# Patient Record
Sex: Male | Born: 1935 | Race: Black or African American | Hispanic: No | Smoking: Former smoker
Health system: Southern US, Community
[De-identification: ages and names within clinical notes are randomized; demographics above are authoritative.]

## PROBLEM LIST (undated history)

## (undated) DIAGNOSIS — C61 Malignant neoplasm of prostate: Secondary | ICD-10-CM

## (undated) DIAGNOSIS — G9341 Metabolic encephalopathy: Secondary | ICD-10-CM

## (undated) DIAGNOSIS — I219 Acute myocardial infarction, unspecified: Secondary | ICD-10-CM

## (undated) DIAGNOSIS — E1121 Type 2 diabetes mellitus with diabetic nephropathy: Secondary | ICD-10-CM

## (undated) DIAGNOSIS — I10 Essential (primary) hypertension: Secondary | ICD-10-CM

## (undated) DIAGNOSIS — G40209 Localization-related (focal) (partial) symptomatic epilepsy and epileptic syndromes with complex partial seizures, not intractable, without status epilepticus: Secondary | ICD-10-CM

## (undated) DIAGNOSIS — D509 Iron deficiency anemia, unspecified: Secondary | ICD-10-CM

## (undated) DIAGNOSIS — N183 Chronic kidney disease, stage 3 (moderate): Secondary | ICD-10-CM

## (undated) DIAGNOSIS — F0390 Unspecified dementia without behavioral disturbance: Secondary | ICD-10-CM

## (undated) DIAGNOSIS — I639 Cerebral infarction, unspecified: Secondary | ICD-10-CM

## (undated) DIAGNOSIS — Z8673 Personal history of transient ischemic attack (TIA), and cerebral infarction without residual deficits: Secondary | ICD-10-CM

## (undated) DIAGNOSIS — E785 Hyperlipidemia, unspecified: Secondary | ICD-10-CM

## (undated) DIAGNOSIS — E1129 Type 2 diabetes mellitus with other diabetic kidney complication: Secondary | ICD-10-CM

## (undated) DIAGNOSIS — C189 Malignant neoplasm of colon, unspecified: Secondary | ICD-10-CM

## (undated) DIAGNOSIS — C180 Malignant neoplasm of cecum: Secondary | ICD-10-CM

## (undated) HISTORY — DX: Unspecified dementia without behavioral disturbance: F03.90

## (undated) HISTORY — DX: Cerebral infarction, unspecified: I63.9

## (undated) HISTORY — DX: Metabolic encephalopathy: G93.41

## (undated) HISTORY — DX: Personal history of transient ischemic attack (TIA), and cerebral infarction without residual deficits: Z86.73

## (undated) HISTORY — DX: Malignant neoplasm of cecum: C18.0

## (undated) HISTORY — DX: Localization-related (focal) (partial) symptomatic epilepsy and epileptic syndromes with complex partial seizures, not intractable, without status epilepticus: G40.209

## (undated) HISTORY — PX: PROSTATE SURGERY: SHX751

## (undated) HISTORY — DX: Type 2 diabetes mellitus with other diabetic kidney complication: E11.29

## (undated) HISTORY — DX: Chronic kidney disease, stage 3 (moderate): N18.3

## (undated) HISTORY — DX: Essential (primary) hypertension: I10

## (undated) HISTORY — DX: Malignant neoplasm of prostate: C61

## (undated) HISTORY — DX: Iron deficiency anemia, unspecified: D50.9

---

## 1998-06-15 ENCOUNTER — Ambulatory Visit (HOSPITAL_COMMUNITY): Admission: RE | Admit: 1998-06-15 | Discharge: 1998-06-15 | Payer: Self-pay | Admitting: Cardiology

## 1998-09-06 ENCOUNTER — Encounter: Payer: Self-pay | Admitting: Cardiology

## 1998-09-06 ENCOUNTER — Ambulatory Visit (HOSPITAL_COMMUNITY): Admission: RE | Admit: 1998-09-06 | Discharge: 1998-09-06 | Payer: Self-pay | Admitting: Cardiology

## 1999-01-07 ENCOUNTER — Emergency Department (HOSPITAL_COMMUNITY): Admission: EM | Admit: 1999-01-07 | Discharge: 1999-01-07 | Payer: Self-pay | Admitting: Emergency Medicine

## 2000-01-01 ENCOUNTER — Emergency Department (HOSPITAL_COMMUNITY): Admission: EM | Admit: 2000-01-01 | Discharge: 2000-01-01 | Payer: Self-pay | Admitting: Emergency Medicine

## 2001-05-23 ENCOUNTER — Emergency Department (HOSPITAL_COMMUNITY): Admission: EM | Admit: 2001-05-23 | Discharge: 2001-05-23 | Payer: Self-pay | Admitting: Emergency Medicine

## 2001-09-15 ENCOUNTER — Encounter: Payer: Self-pay | Admitting: Emergency Medicine

## 2001-09-15 ENCOUNTER — Emergency Department (HOSPITAL_COMMUNITY): Admission: EM | Admit: 2001-09-15 | Discharge: 2001-09-15 | Payer: Self-pay | Admitting: Emergency Medicine

## 2001-12-06 ENCOUNTER — Emergency Department (HOSPITAL_COMMUNITY): Admission: EM | Admit: 2001-12-06 | Discharge: 2001-12-06 | Payer: Self-pay | Admitting: Emergency Medicine

## 2002-05-08 ENCOUNTER — Emergency Department (HOSPITAL_COMMUNITY): Admission: EM | Admit: 2002-05-08 | Discharge: 2002-05-08 | Payer: Self-pay | Admitting: Emergency Medicine

## 2002-09-24 ENCOUNTER — Encounter: Payer: Self-pay | Admitting: Cardiology

## 2002-09-24 ENCOUNTER — Ambulatory Visit (HOSPITAL_COMMUNITY): Admission: RE | Admit: 2002-09-24 | Discharge: 2002-09-24 | Payer: Self-pay | Admitting: Cardiology

## 2004-12-19 ENCOUNTER — Encounter: Admission: RE | Admit: 2004-12-19 | Discharge: 2004-12-19 | Payer: Self-pay | Admitting: Cardiology

## 2005-01-15 ENCOUNTER — Emergency Department (HOSPITAL_COMMUNITY): Admission: EM | Admit: 2005-01-15 | Discharge: 2005-01-15 | Payer: Self-pay | Admitting: Emergency Medicine

## 2006-03-11 ENCOUNTER — Emergency Department (HOSPITAL_COMMUNITY): Admission: EM | Admit: 2006-03-11 | Discharge: 2006-03-11 | Payer: Self-pay | Admitting: Emergency Medicine

## 2006-07-19 ENCOUNTER — Encounter: Admission: RE | Admit: 2006-07-19 | Discharge: 2006-07-19 | Payer: Self-pay | Admitting: Cardiology

## 2007-11-01 ENCOUNTER — Emergency Department (HOSPITAL_COMMUNITY): Admission: EM | Admit: 2007-11-01 | Discharge: 2007-11-01 | Payer: Self-pay | Admitting: Emergency Medicine

## 2007-11-04 ENCOUNTER — Emergency Department (HOSPITAL_COMMUNITY): Admission: EM | Admit: 2007-11-04 | Discharge: 2007-11-04 | Payer: Self-pay | Admitting: Emergency Medicine

## 2007-12-29 ENCOUNTER — Inpatient Hospital Stay (HOSPITAL_COMMUNITY): Admission: EM | Admit: 2007-12-29 | Discharge: 2008-01-02 | Payer: Self-pay | Admitting: Emergency Medicine

## 2008-04-29 ENCOUNTER — Inpatient Hospital Stay (HOSPITAL_COMMUNITY): Admission: EM | Admit: 2008-04-29 | Discharge: 2008-05-02 | Payer: Self-pay | Admitting: Emergency Medicine

## 2008-05-01 ENCOUNTER — Encounter (INDEPENDENT_AMBULATORY_CARE_PROVIDER_SITE_OTHER): Payer: Self-pay | Admitting: Cardiology

## 2008-05-01 ENCOUNTER — Encounter (INDEPENDENT_AMBULATORY_CARE_PROVIDER_SITE_OTHER): Payer: Self-pay | Admitting: Neurology

## 2008-05-01 ENCOUNTER — Ambulatory Visit: Payer: Self-pay | Admitting: Surgery

## 2008-05-11 ENCOUNTER — Encounter (INDEPENDENT_AMBULATORY_CARE_PROVIDER_SITE_OTHER): Payer: Self-pay | Admitting: Cardiovascular Disease

## 2008-05-11 ENCOUNTER — Ambulatory Visit (HOSPITAL_COMMUNITY): Admission: RE | Admit: 2008-05-11 | Discharge: 2008-05-11 | Payer: Self-pay | Admitting: Cardiovascular Disease

## 2009-01-27 ENCOUNTER — Ambulatory Visit (HOSPITAL_COMMUNITY): Admission: RE | Admit: 2009-01-27 | Discharge: 2009-01-27 | Payer: Self-pay | Admitting: Urology

## 2009-03-25 ENCOUNTER — Ambulatory Visit: Admission: RE | Admit: 2009-03-25 | Discharge: 2009-06-10 | Payer: Self-pay | Admitting: Radiation Oncology

## 2009-06-04 ENCOUNTER — Emergency Department (HOSPITAL_COMMUNITY): Admission: EM | Admit: 2009-06-04 | Discharge: 2009-06-04 | Payer: Self-pay | Admitting: Emergency Medicine

## 2009-06-10 ENCOUNTER — Ambulatory Visit: Admission: RE | Admit: 2009-06-10 | Discharge: 2009-07-30 | Payer: Self-pay | Admitting: Radiation Oncology

## 2010-05-19 ENCOUNTER — Encounter: Admission: RE | Admit: 2010-05-19 | Discharge: 2010-05-19 | Payer: Self-pay | Admitting: Gastroenterology

## 2010-06-23 HISTORY — PX: COLON SURGERY: SHX602

## 2010-07-04 ENCOUNTER — Encounter (INDEPENDENT_AMBULATORY_CARE_PROVIDER_SITE_OTHER): Payer: Self-pay | Admitting: General Surgery

## 2010-07-04 ENCOUNTER — Inpatient Hospital Stay (HOSPITAL_COMMUNITY): Admission: RE | Admit: 2010-07-04 | Discharge: 2010-07-10 | Payer: Self-pay | Admitting: General Surgery

## 2010-07-14 ENCOUNTER — Ambulatory Visit: Payer: Self-pay | Admitting: Hematology and Oncology

## 2010-07-27 LAB — CBC WITH DIFFERENTIAL/PLATELET
BASO%: 0.6 % (ref 0.0–2.0)
Basophils Absolute: 0 10*3/uL (ref 0.0–0.1)
EOS%: 3.7 % (ref 0.0–7.0)
Eosinophils Absolute: 0.1 10*3/uL (ref 0.0–0.5)
HCT: 33.2 % — ABNORMAL LOW (ref 38.4–49.9)
HGB: 11.1 g/dL — ABNORMAL LOW (ref 13.0–17.1)
LYMPH%: 23.1 % (ref 14.0–49.0)
MCH: 29.4 pg (ref 27.2–33.4)
MCHC: 33.6 g/dL (ref 32.0–36.0)
MCV: 87.4 fL (ref 79.3–98.0)
MONO#: 0.4 10*3/uL (ref 0.1–0.9)
MONO%: 10.9 % (ref 0.0–14.0)
NEUT#: 2.4 10*3/uL (ref 1.5–6.5)
NEUT%: 61.7 % (ref 39.0–75.0)
Platelets: 366 10*3/uL (ref 140–400)
RBC: 3.8 10*6/uL — ABNORMAL LOW (ref 4.20–5.82)
RDW: 17.1 % — ABNORMAL HIGH (ref 11.0–14.6)
WBC: 3.9 10*3/uL — ABNORMAL LOW (ref 4.0–10.3)
lymph#: 0.9 10*3/uL (ref 0.9–3.3)

## 2010-07-27 LAB — COMPREHENSIVE METABOLIC PANEL
ALT: 17 U/L (ref 0–53)
AST: 14 U/L (ref 0–37)
Albumin: 4.2 g/dL (ref 3.5–5.2)
Alkaline Phosphatase: 97 U/L (ref 39–117)
BUN: 13 mg/dL (ref 6–23)
CO2: 22 mEq/L (ref 19–32)
Calcium: 9.3 mg/dL (ref 8.4–10.5)
Chloride: 97 mEq/L (ref 96–112)
Creatinine, Ser: 1 mg/dL (ref 0.40–1.50)
Glucose, Bld: 298 mg/dL — ABNORMAL HIGH (ref 70–99)
Potassium: 4.2 mEq/L (ref 3.5–5.3)
Sodium: 134 mEq/L — ABNORMAL LOW (ref 135–145)
Total Bilirubin: 0.3 mg/dL (ref 0.3–1.2)
Total Protein: 6.5 g/dL (ref 6.0–8.3)

## 2010-07-27 LAB — CEA: CEA: 1 ng/mL (ref 0.0–5.0)

## 2010-08-16 ENCOUNTER — Ambulatory Visit (HOSPITAL_COMMUNITY): Admission: RE | Admit: 2010-08-16 | Discharge: 2010-08-16 | Payer: Self-pay | Admitting: General Surgery

## 2010-08-19 ENCOUNTER — Ambulatory Visit: Payer: Self-pay | Admitting: Hematology and Oncology

## 2010-08-24 LAB — COMPREHENSIVE METABOLIC PANEL
ALT: 13 U/L (ref 0–53)
AST: 16 U/L (ref 0–37)
Albumin: 4.2 g/dL (ref 3.5–5.2)
Alkaline Phosphatase: 99 U/L (ref 39–117)
BUN: 14 mg/dL (ref 6–23)
CO2: 25 mEq/L (ref 19–32)
Calcium: 9 mg/dL (ref 8.4–10.5)
Chloride: 102 mEq/L (ref 96–112)
Creatinine, Ser: 1.03 mg/dL (ref 0.40–1.50)
Glucose, Bld: 272 mg/dL — ABNORMAL HIGH (ref 70–99)
Potassium: 4.8 mEq/L (ref 3.5–5.3)
Sodium: 137 mEq/L (ref 135–145)
Total Bilirubin: 0.5 mg/dL (ref 0.3–1.2)
Total Protein: 6.6 g/dL (ref 6.0–8.3)

## 2010-08-24 LAB — CBC WITH DIFFERENTIAL/PLATELET
BASO%: 0.5 % (ref 0.0–2.0)
Basophils Absolute: 0 10*3/uL (ref 0.0–0.1)
EOS%: 2.8 % (ref 0.0–7.0)
Eosinophils Absolute: 0.1 10*3/uL (ref 0.0–0.5)
HCT: 33.1 % — ABNORMAL LOW (ref 38.4–49.9)
HGB: 10.4 g/dL — ABNORMAL LOW (ref 13.0–17.1)
LYMPH%: 23.1 % (ref 14.0–49.0)
MCH: 26.8 pg — ABNORMAL LOW (ref 27.2–33.4)
MCHC: 31.4 g/dL — ABNORMAL LOW (ref 32.0–36.0)
MCV: 85.3 fL (ref 79.3–98.0)
MONO#: 0.3 10*3/uL (ref 0.1–0.9)
MONO%: 8.1 % (ref 0.0–14.0)
NEUT#: 2.6 10*3/uL (ref 1.5–6.5)
NEUT%: 65.5 % (ref 39.0–75.0)
Platelets: 234 10*3/uL (ref 140–400)
RBC: 3.88 10*6/uL — ABNORMAL LOW (ref 4.20–5.82)
RDW: 16.4 % — ABNORMAL HIGH (ref 11.0–14.6)
WBC: 3.9 10*3/uL — ABNORMAL LOW (ref 4.0–10.3)
lymph#: 0.9 10*3/uL (ref 0.9–3.3)

## 2010-09-07 LAB — CBC WITH DIFFERENTIAL/PLATELET
BASO%: 0.2 % (ref 0.0–2.0)
Basophils Absolute: 0 10*3/uL (ref 0.0–0.1)
EOS%: 0.7 % (ref 0.0–7.0)
Eosinophils Absolute: 0.1 10*3/uL (ref 0.0–0.5)
HCT: 31 % — ABNORMAL LOW (ref 38.4–49.9)
HGB: 9.6 g/dL — ABNORMAL LOW (ref 13.0–17.1)
LYMPH%: 13.8 % — ABNORMAL LOW (ref 14.0–49.0)
MCH: 26.4 pg — ABNORMAL LOW (ref 27.2–33.4)
MCHC: 31 g/dL — ABNORMAL LOW (ref 32.0–36.0)
MCV: 85.4 fL (ref 79.3–98.0)
MONO#: 0.7 10*3/uL (ref 0.1–0.9)
MONO%: 7.9 % (ref 0.0–14.0)
NEUT#: 6.4 10*3/uL (ref 1.5–6.5)
NEUT%: 77.4 % — ABNORMAL HIGH (ref 39.0–75.0)
Platelets: 155 10*3/uL (ref 140–400)
RBC: 3.63 10*6/uL — ABNORMAL LOW (ref 4.20–5.82)
RDW: 18.3 % — ABNORMAL HIGH (ref 11.0–14.6)
WBC: 8.3 10*3/uL (ref 4.0–10.3)
lymph#: 1.2 10*3/uL (ref 0.9–3.3)
nRBC: 1 % — ABNORMAL HIGH (ref 0–0)

## 2010-09-07 LAB — COMPREHENSIVE METABOLIC PANEL
ALT: 11 U/L (ref 0–53)
AST: 13 U/L (ref 0–37)
Albumin: 4 g/dL (ref 3.5–5.2)
Alkaline Phosphatase: 109 U/L (ref 39–117)
BUN: 14 mg/dL (ref 6–23)
CO2: 27 mEq/L (ref 19–32)
Calcium: 9 mg/dL (ref 8.4–10.5)
Chloride: 101 mEq/L (ref 96–112)
Creatinine, Ser: 1.03 mg/dL (ref 0.40–1.50)
Glucose, Bld: 295 mg/dL — ABNORMAL HIGH (ref 70–99)
Potassium: 4.2 mEq/L (ref 3.5–5.3)
Sodium: 135 mEq/L (ref 135–145)
Total Bilirubin: 0.3 mg/dL (ref 0.3–1.2)
Total Protein: 6.1 g/dL (ref 6.0–8.3)

## 2010-09-19 ENCOUNTER — Ambulatory Visit: Payer: Self-pay | Admitting: Hematology and Oncology

## 2010-09-20 LAB — COMPREHENSIVE METABOLIC PANEL
Albumin: 4.3 g/dL (ref 3.5–5.2)
BUN: 11 mg/dL (ref 6–23)
CO2: 27 mEq/L (ref 19–32)
Calcium: 9.1 mg/dL (ref 8.4–10.5)
Chloride: 102 mEq/L (ref 96–112)
Creatinine, Ser: 1.16 mg/dL (ref 0.40–1.50)
Glucose, Bld: 309 mg/dL — ABNORMAL HIGH (ref 70–99)
Potassium: 4.6 mEq/L (ref 3.5–5.3)

## 2010-09-20 LAB — CBC WITH DIFFERENTIAL/PLATELET
Basophils Absolute: 0 10*3/uL (ref 0.0–0.1)
EOS%: 0.1 % (ref 0.0–7.0)
Eosinophils Absolute: 0 10*3/uL (ref 0.0–0.5)
HCT: 31 % — ABNORMAL LOW (ref 38.4–49.9)
HGB: 10 g/dL — ABNORMAL LOW (ref 13.0–17.1)
MCH: 27.3 pg (ref 27.2–33.4)
MONO#: 1.3 10*3/uL — ABNORMAL HIGH (ref 0.1–0.9)
NEUT#: 14.1 10*3/uL — ABNORMAL HIGH (ref 1.5–6.5)
NEUT%: 84.2 % — ABNORMAL HIGH (ref 39.0–75.0)
lymph#: 1.4 10*3/uL (ref 0.9–3.3)

## 2010-10-05 LAB — CBC WITH DIFFERENTIAL/PLATELET
BASO%: 0.2 % (ref 0.0–2.0)
Basophils Absolute: 0 10*3/uL (ref 0.0–0.1)
EOS%: 0.3 % (ref 0.0–7.0)
Eosinophils Absolute: 0 10*3/uL (ref 0.0–0.5)
HCT: 29.5 % — ABNORMAL LOW (ref 38.4–49.9)
HGB: 9.5 g/dL — ABNORMAL LOW (ref 13.0–17.1)
LYMPH%: 5.6 % — ABNORMAL LOW (ref 14.0–49.0)
MCH: 27.1 pg — ABNORMAL LOW (ref 27.2–33.4)
MCHC: 32.3 g/dL (ref 32.0–36.0)
MCV: 83.8 fL (ref 79.3–98.0)
MONO#: 0.7 10*3/uL (ref 0.1–0.9)
MONO%: 5.2 % (ref 0.0–14.0)
NEUT#: 11.7 10*3/uL — ABNORMAL HIGH (ref 1.5–6.5)
NEUT%: 88.7 % — ABNORMAL HIGH (ref 39.0–75.0)
Platelets: 128 10*3/uL — ABNORMAL LOW (ref 140–400)
RBC: 3.52 10*6/uL — ABNORMAL LOW (ref 4.20–5.82)
RDW: 23.7 % — ABNORMAL HIGH (ref 11.0–14.6)
WBC: 13.2 10*3/uL — ABNORMAL HIGH (ref 4.0–10.3)
lymph#: 0.7 10*3/uL — ABNORMAL LOW (ref 0.9–3.3)

## 2010-10-05 LAB — COMPREHENSIVE METABOLIC PANEL
ALT: 11 U/L (ref 0–53)
AST: 18 U/L (ref 0–37)
Albumin: 3.8 g/dL (ref 3.5–5.2)
Alkaline Phosphatase: 149 U/L — ABNORMAL HIGH (ref 39–117)
BUN: 11 mg/dL (ref 6–23)
CO2: 26 mEq/L (ref 19–32)
Calcium: 8.6 mg/dL (ref 8.4–10.5)
Chloride: 100 mEq/L (ref 96–112)
Creatinine, Ser: 1 mg/dL (ref 0.40–1.50)
Glucose, Bld: 320 mg/dL — ABNORMAL HIGH (ref 70–99)
Potassium: 4.1 mEq/L (ref 3.5–5.3)
Sodium: 134 mEq/L — ABNORMAL LOW (ref 135–145)
Total Bilirubin: 0.4 mg/dL (ref 0.3–1.2)
Total Protein: 5.8 g/dL — ABNORMAL LOW (ref 6.0–8.3)

## 2010-10-12 LAB — COMPREHENSIVE METABOLIC PANEL
ALT: 16 U/L (ref 0–53)
AST: 19 U/L (ref 0–37)
Albumin: 4.2 g/dL (ref 3.5–5.2)
Alkaline Phosphatase: 168 U/L — ABNORMAL HIGH (ref 39–117)
BUN: 13 mg/dL (ref 6–23)
CO2: 23 mEq/L (ref 19–32)
Calcium: 8.5 mg/dL (ref 8.4–10.5)
Chloride: 100 mEq/L (ref 96–112)
Creatinine, Ser: 1.11 mg/dL (ref 0.40–1.50)
Glucose, Bld: 369 mg/dL — ABNORMAL HIGH (ref 70–99)
Potassium: 4.3 mEq/L (ref 3.5–5.3)
Sodium: 135 mEq/L (ref 135–145)
Total Bilirubin: 0.6 mg/dL (ref 0.3–1.2)
Total Protein: 6.3 g/dL (ref 6.0–8.3)

## 2010-10-12 LAB — CBC WITH DIFFERENTIAL/PLATELET
BASO%: 0.1 % (ref 0.0–2.0)
Basophils Absolute: 0 10*3/uL (ref 0.0–0.1)
EOS%: 0.5 % (ref 0.0–7.0)
Eosinophils Absolute: 0 10*3/uL (ref 0.0–0.5)
HCT: 28.5 % — ABNORMAL LOW (ref 38.4–49.9)
HGB: 9.5 g/dL — ABNORMAL LOW (ref 13.0–17.1)
LYMPH%: 8.3 % — ABNORMAL LOW (ref 14.0–49.0)
MCH: 28 pg (ref 27.2–33.4)
MCHC: 33.3 g/dL (ref 32.0–36.0)
MCV: 84 fL (ref 79.3–98.0)
MONO#: 0.2 10*3/uL (ref 0.1–0.9)
MONO%: 3.4 % (ref 0.0–14.0)
NEUT#: 4.6 10*3/uL (ref 1.5–6.5)
NEUT%: 87.7 % — ABNORMAL HIGH (ref 39.0–75.0)
Platelets: 146 10*3/uL (ref 140–400)
RBC: 3.4 10*6/uL — ABNORMAL LOW (ref 4.20–5.82)
RDW: 24.9 % — ABNORMAL HIGH (ref 11.0–14.6)
WBC: 5.2 10*3/uL (ref 4.0–10.3)
lymph#: 0.4 10*3/uL — ABNORMAL LOW (ref 0.9–3.3)

## 2010-10-19 ENCOUNTER — Ambulatory Visit: Payer: Self-pay | Admitting: Hematology and Oncology

## 2010-10-19 LAB — CBC WITH DIFFERENTIAL/PLATELET
BASO%: 0.4 % (ref 0.0–2.0)
Basophils Absolute: 0 10*3/uL (ref 0.0–0.1)
EOS%: 0.2 % (ref 0.0–7.0)
Eosinophils Absolute: 0 10*3/uL (ref 0.0–0.5)
HCT: 27.9 % — ABNORMAL LOW (ref 38.4–49.9)
HGB: 8.8 g/dL — ABNORMAL LOW (ref 13.0–17.1)
LYMPH%: 10.6 % — ABNORMAL LOW (ref 14.0–49.0)
MCH: 27 pg — ABNORMAL LOW (ref 27.2–33.4)
MCHC: 31.5 g/dL — ABNORMAL LOW (ref 32.0–36.0)
MCV: 85.6 fL (ref 79.3–98.0)
MONO#: 1 10*3/uL — ABNORMAL HIGH (ref 0.1–0.9)
MONO%: 9.6 % (ref 0.0–14.0)
NEUT#: 8.4 10*3/uL — ABNORMAL HIGH (ref 1.5–6.5)
NEUT%: 79.2 % — ABNORMAL HIGH (ref 39.0–75.0)
Platelets: 100 10*3/uL — ABNORMAL LOW (ref 140–400)
RBC: 3.26 10*6/uL — ABNORMAL LOW (ref 4.20–5.82)
RDW: 25.2 % — ABNORMAL HIGH (ref 11.0–14.6)
WBC: 10.6 10*3/uL — ABNORMAL HIGH (ref 4.0–10.3)
lymph#: 1.1 10*3/uL (ref 0.9–3.3)
nRBC: 1 % — ABNORMAL HIGH (ref 0–0)

## 2010-11-01 LAB — CBC WITH DIFFERENTIAL/PLATELET
BASO%: 0 % (ref 0.0–2.0)
Basophils Absolute: 0 10*3/uL (ref 0.0–0.1)
EOS%: 0.3 % (ref 0.0–7.0)
Eosinophils Absolute: 0 10*3/uL (ref 0.0–0.5)
HCT: 28.3 % — ABNORMAL LOW (ref 38.4–49.9)
HGB: 9.2 g/dL — ABNORMAL LOW (ref 13.0–17.1)
LYMPH%: 7 % — ABNORMAL LOW (ref 14.0–49.0)
MCH: 28.5 pg (ref 27.2–33.4)
MCHC: 32.5 g/dL (ref 32.0–36.0)
MCV: 87.7 fL (ref 79.3–98.0)
MONO#: 0.8 10*3/uL (ref 0.1–0.9)
MONO%: 7.1 % (ref 0.0–14.0)
NEUT#: 9.8 10*3/uL — ABNORMAL HIGH (ref 1.5–6.5)
NEUT%: 85.6 % — ABNORMAL HIGH (ref 39.0–75.0)
Platelets: 76 10*3/uL — ABNORMAL LOW (ref 140–400)
RBC: 3.23 10*6/uL — ABNORMAL LOW (ref 4.20–5.82)
RDW: 29.5 % — ABNORMAL HIGH (ref 11.0–14.6)
WBC: 11.4 10*3/uL — ABNORMAL HIGH (ref 4.0–10.3)
lymph#: 0.8 10*3/uL — ABNORMAL LOW (ref 0.9–3.3)

## 2010-11-01 LAB — COMPREHENSIVE METABOLIC PANEL
ALT: 18 U/L (ref 0–53)
AST: 18 U/L (ref 0–37)
Albumin: 4 g/dL (ref 3.5–5.2)
Alkaline Phosphatase: 147 U/L — ABNORMAL HIGH (ref 39–117)
BUN: 9 mg/dL (ref 6–23)
CO2: 26 mEq/L (ref 19–32)
Calcium: 8.8 mg/dL (ref 8.4–10.5)
Chloride: 101 mEq/L (ref 96–112)
Creatinine, Ser: 0.96 mg/dL (ref 0.40–1.50)
Glucose, Bld: 365 mg/dL — ABNORMAL HIGH (ref 70–99)
Potassium: 4.3 mEq/L (ref 3.5–5.3)
Sodium: 137 mEq/L (ref 135–145)
Total Bilirubin: 0.4 mg/dL (ref 0.3–1.2)
Total Protein: 6.3 g/dL (ref 6.0–8.3)

## 2010-11-09 LAB — COMPREHENSIVE METABOLIC PANEL
ALT: 23 U/L (ref 0–53)
AST: 23 U/L (ref 0–37)
Albumin: 4.1 g/dL (ref 3.5–5.2)
Alkaline Phosphatase: 116 U/L (ref 39–117)
BUN: 15 mg/dL (ref 6–23)
CO2: 27 mEq/L (ref 19–32)
Calcium: 9.2 mg/dL (ref 8.4–10.5)
Chloride: 101 mEq/L (ref 96–112)
Creatinine, Ser: 1.01 mg/dL (ref 0.40–1.50)
Glucose, Bld: 378 mg/dL — ABNORMAL HIGH (ref 70–99)
Potassium: 4.3 mEq/L (ref 3.5–5.3)
Sodium: 137 mEq/L (ref 135–145)
Total Bilirubin: 0.4 mg/dL (ref 0.3–1.2)
Total Protein: 6.5 g/dL (ref 6.0–8.3)

## 2010-11-09 LAB — CBC WITH DIFFERENTIAL/PLATELET
BASO%: 0.5 % (ref 0.0–2.0)
Basophils Absolute: 0 10*3/uL (ref 0.0–0.1)
EOS%: 0.9 % (ref 0.0–7.0)
Eosinophils Absolute: 0 10*3/uL (ref 0.0–0.5)
HCT: 30.3 % — ABNORMAL LOW (ref 38.4–49.9)
HGB: 10 g/dL — ABNORMAL LOW (ref 13.0–17.1)
LYMPH%: 14.3 % (ref 14.0–49.0)
MCH: 29.4 pg (ref 27.2–33.4)
MCHC: 33 g/dL (ref 32.0–36.0)
MCV: 89.2 fL (ref 79.3–98.0)
MONO#: 0.5 10*3/uL (ref 0.1–0.9)
MONO%: 14.2 % — ABNORMAL HIGH (ref 0.0–14.0)
NEUT#: 2.6 10*3/uL (ref 1.5–6.5)
NEUT%: 70.1 % (ref 39.0–75.0)
Platelets: 225 10*3/uL (ref 140–400)
RBC: 3.4 10*6/uL — ABNORMAL LOW (ref 4.20–5.82)
RDW: 29.9 % — ABNORMAL HIGH (ref 11.0–14.6)
WBC: 3.7 10*3/uL — ABNORMAL LOW (ref 4.0–10.3)
lymph#: 0.5 10*3/uL — ABNORMAL LOW (ref 0.9–3.3)

## 2010-11-12 ENCOUNTER — Encounter: Payer: Self-pay | Admitting: Hematology and Oncology

## 2010-11-18 ENCOUNTER — Ambulatory Visit: Payer: Self-pay | Admitting: Hematology and Oncology

## 2010-11-22 LAB — CBC WITH DIFFERENTIAL/PLATELET
BASO%: 0.4 % (ref 0.0–2.0)
EOS%: 0.4 % (ref 0.0–7.0)
Eosinophils Absolute: 0 10*3/uL (ref 0.0–0.5)
HCT: 30.1 % — ABNORMAL LOW (ref 38.4–49.9)
HGB: 9.9 g/dL — ABNORMAL LOW (ref 13.0–17.1)
MCH: 29.6 pg (ref 27.2–33.4)
MCHC: 33 g/dL (ref 32.0–36.0)
MCV: 89.8 fL (ref 79.3–98.0)
MONO%: 6.6 % (ref 0.0–14.0)
NEUT#: 6.9 10*3/uL — ABNORMAL HIGH (ref 1.5–6.5)
NEUT%: 81.8 % — ABNORMAL HIGH (ref 39.0–75.0)
RDW: 27.5 % — ABNORMAL HIGH (ref 11.0–14.6)
WBC: 8.5 10*3/uL (ref 4.0–10.3)
lymph#: 0.9 10*3/uL (ref 0.9–3.3)

## 2010-11-22 LAB — COMPREHENSIVE METABOLIC PANEL
ALT: 14 U/L (ref 0–53)
AST: 17 U/L (ref 0–37)
Albumin: 4.2 g/dL (ref 3.5–5.2)
Alkaline Phosphatase: 145 U/L — ABNORMAL HIGH (ref 39–117)
BUN: 10 mg/dL (ref 6–23)
CO2: 25 mEq/L (ref 19–32)
Calcium: 8.9 mg/dL (ref 8.4–10.5)
Chloride: 100 mEq/L (ref 96–112)
Creatinine, Ser: 0.95 mg/dL (ref 0.40–1.50)
Glucose, Bld: 315 mg/dL — ABNORMAL HIGH (ref 70–99)
Potassium: 3.9 mEq/L (ref 3.5–5.3)
Sodium: 136 mEq/L (ref 135–145)
Total Bilirubin: 0.4 mg/dL (ref 0.3–1.2)

## 2010-11-23 ENCOUNTER — Encounter (HOSPITAL_BASED_OUTPATIENT_CLINIC_OR_DEPARTMENT_OTHER): Payer: Medicare Other | Admitting: Oncology

## 2010-11-23 DIAGNOSIS — Z5111 Encounter for antineoplastic chemotherapy: Secondary | ICD-10-CM

## 2010-11-23 DIAGNOSIS — C182 Malignant neoplasm of ascending colon: Secondary | ICD-10-CM

## 2010-11-25 ENCOUNTER — Encounter (HOSPITAL_BASED_OUTPATIENT_CLINIC_OR_DEPARTMENT_OTHER): Payer: Medicare Other | Admitting: Hematology and Oncology

## 2010-11-25 DIAGNOSIS — C182 Malignant neoplasm of ascending colon: Secondary | ICD-10-CM

## 2010-12-06 ENCOUNTER — Other Ambulatory Visit: Payer: Self-pay | Admitting: Hematology and Oncology

## 2010-12-06 ENCOUNTER — Encounter (HOSPITAL_BASED_OUTPATIENT_CLINIC_OR_DEPARTMENT_OTHER): Payer: Medicare Other | Admitting: Hematology and Oncology

## 2010-12-06 DIAGNOSIS — C61 Malignant neoplasm of prostate: Secondary | ICD-10-CM

## 2010-12-06 DIAGNOSIS — D649 Anemia, unspecified: Secondary | ICD-10-CM

## 2010-12-06 DIAGNOSIS — C182 Malignant neoplasm of ascending colon: Secondary | ICD-10-CM

## 2010-12-06 LAB — COMPREHENSIVE METABOLIC PANEL
ALT: 14 U/L (ref 0–53)
AST: 19 U/L (ref 0–37)
Albumin: 3.8 g/dL (ref 3.5–5.2)
Alkaline Phosphatase: 131 U/L — ABNORMAL HIGH (ref 39–117)
BUN: 8 mg/dL (ref 6–23)
CO2: 26 mEq/L (ref 19–32)
Calcium: 8.3 mg/dL — ABNORMAL LOW (ref 8.4–10.5)
Chloride: 103 mEq/L (ref 96–112)
Creatinine, Ser: 0.94 mg/dL (ref 0.40–1.50)
Glucose, Bld: 304 mg/dL — ABNORMAL HIGH (ref 70–99)
Potassium: 3.9 mEq/L (ref 3.5–5.3)
Sodium: 138 mEq/L (ref 135–145)
Total Bilirubin: 0.4 mg/dL (ref 0.3–1.2)

## 2010-12-06 LAB — CBC WITH DIFFERENTIAL/PLATELET
BASO%: 0.1 % (ref 0.0–2.0)
Basophils Absolute: 0 10*3/uL (ref 0.0–0.1)
EOS%: 0.3 % (ref 0.0–7.0)
Eosinophils Absolute: 0 10*3/uL (ref 0.0–0.5)
HCT: 27.8 % — ABNORMAL LOW (ref 38.4–49.9)
HGB: 8.9 g/dL — ABNORMAL LOW (ref 13.0–17.1)
MCH: 30.1 pg (ref 27.2–33.4)
MCHC: 32.2 g/dL (ref 32.0–36.0)
MCV: 93.6 fL (ref 79.3–98.0)
MONO%: 2.1 % (ref 0.0–14.0)
NEUT%: 89.9 % — ABNORMAL HIGH (ref 39.0–75.0)
RBC: 2.97 10*6/uL — ABNORMAL LOW (ref 4.20–5.82)
RDW: 26.7 % — ABNORMAL HIGH (ref 11.0–14.6)
lymph#: 0.7 10*3/uL — ABNORMAL LOW (ref 0.9–3.3)

## 2010-12-14 ENCOUNTER — Other Ambulatory Visit: Payer: Self-pay | Admitting: Hematology and Oncology

## 2010-12-14 ENCOUNTER — Encounter (HOSPITAL_BASED_OUTPATIENT_CLINIC_OR_DEPARTMENT_OTHER): Payer: Medicare Other | Admitting: Hematology and Oncology

## 2010-12-14 ENCOUNTER — Ambulatory Visit (HOSPITAL_COMMUNITY)
Admission: RE | Admit: 2010-12-14 | Discharge: 2010-12-14 | Disposition: A | Discharge status: Home / Self Care | Payer: Medicare Other | Source: Ambulatory Visit | Attending: Hematology and Oncology | Admitting: Hematology and Oncology

## 2010-12-14 DIAGNOSIS — Z5111 Encounter for antineoplastic chemotherapy: Secondary | ICD-10-CM

## 2010-12-14 DIAGNOSIS — M7989 Other specified soft tissue disorders: Secondary | ICD-10-CM

## 2010-12-14 DIAGNOSIS — C182 Malignant neoplasm of ascending colon: Secondary | ICD-10-CM

## 2010-12-14 DIAGNOSIS — Z79899 Other long term (current) drug therapy: Secondary | ICD-10-CM | POA: Insufficient documentation

## 2010-12-14 LAB — CBC WITH DIFFERENTIAL/PLATELET
Basophils Absolute: 0 10*3/uL (ref 0.0–0.1)
HCT: 30.9 % — ABNORMAL LOW (ref 38.4–49.9)
HGB: 9.7 g/dL — ABNORMAL LOW (ref 13.0–17.1)
MONO#: 0.7 10*3/uL (ref 0.1–0.9)
NEUT#: 4 10*3/uL (ref 1.5–6.5)
NEUT%: 69.6 % (ref 39.0–75.0)
RDW: 23 % — ABNORMAL HIGH (ref 11.0–14.6)
WBC: 5.8 10*3/uL (ref 4.0–10.3)
lymph#: 1 10*3/uL (ref 0.9–3.3)

## 2010-12-15 NOTE — Discharge Summary (Signed)
  NAMENATHYN, [name]               ACCOUNT NO.:  [id number]  MEDICAL RECORD NO.:  [id number]          PATIENT TYPE:  INP  LOCATION:  5124                         FACILITY:  [facility]  PHYSICIAN:  Ollen Gross. Vernell Morgans, M.D. DATE OF BIRTH:  03/04/1931  DATE OF ADMISSION:  07/04/2010 DATE OF DISCHARGE:  07/10/2010                              DISCHARGE SUMMARY   HISTORY OF PRESENT ILLNESS:  [name] is a 77 year old black male who had a colon cancer at his hepatic flexure.  He was brought to the operating room on September 12 and underwent an extended right colectomy which he tolerated well and he was started on antibiotic protocol.  His Foley was discontinued on postop day #2.  On postop day #1, Lovenox was started.  Reglan was added on postop day #2 for ileus.  He was covered with sliding scale for his diabetes with blood sugars around 100.  He is started on sips and clears on postop day #3.  He gradually improved and by September 18, was tolerating his diet.  His pain was under control. He was ambulating.  He had had a bowel movement.  He was ready for discharge home.  MEDICATIONS:  He was to resume his home meds.  He was given a prescription for pain medicine.  DIET:  As tolerated.  ACTIVITIES:  No heavy lifting.  FINAL DIAGNOSIS:  Colon cancer at the hepatic flexure.  Follow up with Dr. Carolynne Edouard in the next week or two and he is discharged home.     Ollen Gross. Vernell Morgans, M.D.     PST/MEDQ  D:  12/13/2010  T:  12/13/2010  Job:  [id number]  Electronically Signed by Chevis Pretty III M.D. on 12/15/2010 07:38:27 AM

## 2010-12-16 ENCOUNTER — Encounter (HOSPITAL_BASED_OUTPATIENT_CLINIC_OR_DEPARTMENT_OTHER): Payer: Medicare Other | Admitting: Hematology and Oncology

## 2010-12-16 DIAGNOSIS — C182 Malignant neoplasm of ascending colon: Secondary | ICD-10-CM

## 2010-12-27 ENCOUNTER — Other Ambulatory Visit: Payer: Self-pay | Admitting: Hematology and Oncology

## 2010-12-27 ENCOUNTER — Encounter (HOSPITAL_BASED_OUTPATIENT_CLINIC_OR_DEPARTMENT_OTHER): Payer: Medicare Other | Admitting: Hematology and Oncology

## 2010-12-27 DIAGNOSIS — C182 Malignant neoplasm of ascending colon: Secondary | ICD-10-CM

## 2010-12-27 DIAGNOSIS — D649 Anemia, unspecified: Secondary | ICD-10-CM

## 2010-12-27 LAB — CBC WITH DIFFERENTIAL/PLATELET
Basophils Absolute: 0 10*3/uL (ref 0.0–0.1)
Eosinophils Absolute: 0 10*3/uL (ref 0.0–0.5)
HGB: 10.3 g/dL — ABNORMAL LOW (ref 13.0–17.1)
MCV: 94.1 fL (ref 79.3–98.0)
MONO#: 0.9 10*3/uL (ref 0.1–0.9)
MONO%: 7.1 % (ref 0.0–14.0)
NEUT#: 10.5 10*3/uL — ABNORMAL HIGH (ref 1.5–6.5)
Platelets: 105 10*3/uL — ABNORMAL LOW (ref 140–400)
RBC: 3.37 10*6/uL — ABNORMAL LOW (ref 4.20–5.82)
RDW: 23.3 % — ABNORMAL HIGH (ref 11.0–14.6)
WBC: 12.3 10*3/uL — ABNORMAL HIGH (ref 4.0–10.3)

## 2010-12-27 LAB — COMPREHENSIVE METABOLIC PANEL
Albumin: 4.3 g/dL (ref 3.5–5.2)
Alkaline Phosphatase: 164 U/L — ABNORMAL HIGH (ref 39–117)
BUN: 10 mg/dL (ref 6–23)
CO2: 24 mEq/L (ref 19–32)
Calcium: 8.9 mg/dL (ref 8.4–10.5)
Glucose, Bld: 322 mg/dL — ABNORMAL HIGH (ref 70–99)
Potassium: 4.1 mEq/L (ref 3.5–5.3)
Sodium: 136 mEq/L (ref 135–145)
Total Protein: 6.7 g/dL (ref 6.0–8.3)

## 2010-12-28 ENCOUNTER — Encounter (HOSPITAL_BASED_OUTPATIENT_CLINIC_OR_DEPARTMENT_OTHER): Payer: Medicare Other | Admitting: Hematology and Oncology

## 2010-12-28 DIAGNOSIS — Z5111 Encounter for antineoplastic chemotherapy: Secondary | ICD-10-CM

## 2010-12-28 DIAGNOSIS — C182 Malignant neoplasm of ascending colon: Secondary | ICD-10-CM

## 2010-12-30 ENCOUNTER — Encounter (HOSPITAL_BASED_OUTPATIENT_CLINIC_OR_DEPARTMENT_OTHER): Payer: Medicare Other | Admitting: Hematology and Oncology

## 2010-12-30 DIAGNOSIS — C182 Malignant neoplasm of ascending colon: Secondary | ICD-10-CM

## 2011-01-04 LAB — CBC
HCT: 35 % — ABNORMAL LOW (ref 39.0–52.0)
Hemoglobin: 11.2 g/dL — ABNORMAL LOW (ref 13.0–17.0)
MCHC: 32 g/dL (ref 30.0–36.0)
RBC: 4.05 MIL/uL — ABNORMAL LOW (ref 4.22–5.81)
WBC: 4.1 10*3/uL (ref 4.0–10.5)

## 2011-01-04 LAB — BASIC METABOLIC PANEL
Calcium: 9.2 mg/dL (ref 8.4–10.5)
GFR calc Af Amer: >60 mL/min (ref >60)
GFR calc non Af Amer: >60 mL/min (ref >60)
Potassium: 4.4 mEq/L (ref 3.5–5.1)
Sodium: 134 mEq/L — ABNORMAL LOW (ref 135–145)

## 2011-01-04 LAB — PROTIME-INR
INR: 1.05 (ref 0.00–1.49)
Prothrombin Time: 13.9 seconds (ref 11.6–15.2)

## 2011-01-04 LAB — SURGICAL PCR SCREEN: MRSA, PCR: NEGATIVE

## 2011-01-05 LAB — GLUCOSE, CAPILLARY
Glucose-Capillary: 103 mg/dL — ABNORMAL HIGH (ref 70–99)
Glucose-Capillary: 104 mg/dL — ABNORMAL HIGH (ref 70–99)
Glucose-Capillary: 132 mg/dL — ABNORMAL HIGH (ref 70–99)
Glucose-Capillary: 134 mg/dL — ABNORMAL HIGH (ref 70–99)
Glucose-Capillary: 157 mg/dL — ABNORMAL HIGH (ref 70–99)
Glucose-Capillary: 174 mg/dL — ABNORMAL HIGH (ref 70–99)
Glucose-Capillary: 195 mg/dL — ABNORMAL HIGH (ref 70–99)
Glucose-Capillary: 196 mg/dL — ABNORMAL HIGH (ref 70–99)
Glucose-Capillary: 203 mg/dL — ABNORMAL HIGH (ref 70–99)
Glucose-Capillary: 119 mg/dL — ABNORMAL HIGH (ref 70–99)
Glucose-Capillary: 162 mg/dL — ABNORMAL HIGH (ref 70–99)
Glucose-Capillary: 198 mg/dL — ABNORMAL HIGH (ref 70–99)
Glucose-Capillary: 205 mg/dL — ABNORMAL HIGH (ref 70–99)
Glucose-Capillary: 209 mg/dL — ABNORMAL HIGH (ref 70–99)
Glucose-Capillary: 220 mg/dL — ABNORMAL HIGH (ref 70–99)
Glucose-Capillary: 88 mg/dL (ref 70–99)

## 2011-01-05 LAB — DIFFERENTIAL
Basophils Absolute: 0 10*3/uL (ref 0.0–0.1)
Basophils Absolute: 0 10*3/uL (ref 0.0–0.1)
Basophils Relative: 0 % (ref 0–1)
Basophils Relative: 1 % (ref 0–1)
Eosinophils Relative: 0 % (ref 0–5)
Eosinophils Relative: 2 % (ref 0–5)
Lymphocytes Relative: 12 % (ref 12–46)
Lymphocytes Relative: 25 % (ref 12–46)
Lymphocytes Relative: 9 % — ABNORMAL LOW (ref 12–46)
Lymphs Abs: 0.5 10*3/uL — ABNORMAL LOW (ref 0.7–4.0)
Monocytes Absolute: 0.3 10*3/uL (ref 0.1–1.0)
Monocytes Absolute: 0.5 10*3/uL (ref 0.1–1.0)
Monocytes Relative: 9 % (ref 3–12)
Neutro Abs: 4.6 10*3/uL (ref 1.7–7.7)
Neutro Abs: 6 10*3/uL (ref 1.7–7.7)
Neutrophils Relative %: 81 % — ABNORMAL HIGH (ref 43–77)

## 2011-01-05 LAB — CBC
HCT: 30.7 % — ABNORMAL LOW (ref 39.0–52.0)
HCT: 34 % — ABNORMAL LOW (ref 39.0–52.0)
Hemoglobin: 11.3 g/dL — ABNORMAL LOW (ref 13.0–17.0)
Hemoglobin: 9.9 g/dL — ABNORMAL LOW (ref 13.0–17.0)
MCH: 30 pg (ref 26.0–34.0)
MCHC: 32.2 g/dL (ref 30.0–36.0)
MCHC: 33.2 g/dL (ref 30.0–36.0)
MCHC: 33.2 g/dL (ref 30.0–36.0)
MCV: 89.3 fL (ref 78.0–100.0)
Platelets: 227 10*3/uL (ref 150–400)
RBC: 3.37 MIL/uL — ABNORMAL LOW (ref 4.22–5.81)
RBC: 3.77 MIL/uL — ABNORMAL LOW (ref 4.22–5.81)
RDW: 15.3 % (ref 11.5–15.5)
WBC: 5.6 10*3/uL (ref 4.0–10.5)
WBC: 7.5 10*3/uL (ref 4.0–10.5)

## 2011-01-05 LAB — COMPREHENSIVE METABOLIC PANEL
AST: 22 U/L (ref 0–37)
Albumin: 3.5 g/dL (ref 3.5–5.2)
Alkaline Phosphatase: 97 U/L (ref 39–117)
BUN: 12 mg/dL (ref 6–23)
CO2: 29 mEq/L (ref 19–32)
Chloride: 99 mEq/L (ref 96–112)
Creatinine, Ser: 1.11 mg/dL (ref 0.4–1.5)
GFR calc non Af Amer: >60 mL/min (ref >60)
Potassium: 4.3 mEq/L (ref 3.5–5.1)
Total Bilirubin: 0.4 mg/dL (ref 0.3–1.2)

## 2011-01-05 LAB — BASIC METABOLIC PANEL
BUN: 8 mg/dL (ref 6–23)
Calcium: 8.5 mg/dL (ref 8.4–10.5)
Creatinine, Ser: 1.06 mg/dL (ref 0.4–1.5)
GFR calc Af Amer: >60 mL/min (ref >60)
GFR calc non Af Amer: >60 mL/min (ref >60)
GFR calc non Af Amer: >60 mL/min (ref >60)
Glucose, Bld: 108 mg/dL — ABNORMAL HIGH (ref 70–99)
Glucose, Bld: 203 mg/dL — ABNORMAL HIGH (ref 70–99)
Potassium: 4.1 mEq/L (ref 3.5–5.1)
Sodium: 138 mEq/L (ref 135–145)

## 2011-01-05 LAB — SURGICAL PCR SCREEN: Staphylococcus aureus: NEGATIVE

## 2011-01-10 ENCOUNTER — Encounter (HOSPITAL_BASED_OUTPATIENT_CLINIC_OR_DEPARTMENT_OTHER): Payer: Medicare Other | Admitting: Hematology and Oncology

## 2011-01-10 ENCOUNTER — Other Ambulatory Visit: Payer: Self-pay | Admitting: Hematology and Oncology

## 2011-01-10 DIAGNOSIS — Z5111 Encounter for antineoplastic chemotherapy: Secondary | ICD-10-CM

## 2011-01-10 DIAGNOSIS — C182 Malignant neoplasm of ascending colon: Secondary | ICD-10-CM

## 2011-01-10 LAB — COMPREHENSIVE METABOLIC PANEL
AST: 21 U/L (ref 0–37)
Albumin: 4 g/dL (ref 3.5–5.2)
Alkaline Phosphatase: 152 U/L — ABNORMAL HIGH (ref 39–117)
BUN: 10 mg/dL (ref 6–23)
Calcium: 9.1 mg/dL (ref 8.4–10.5)
Chloride: 100 mEq/L (ref 96–112)
Glucose, Bld: 312 mg/dL — ABNORMAL HIGH (ref 70–99)
Potassium: 4.3 mEq/L (ref 3.5–5.3)
Sodium: 137 mEq/L (ref 135–145)
Total Protein: 6.6 g/dL (ref 6.0–8.3)

## 2011-01-10 LAB — CBC WITH DIFFERENTIAL/PLATELET
Basophils Absolute: 0 10*3/uL (ref 0.0–0.1)
EOS%: 0.3 % (ref 0.0–7.0)
Eosinophils Absolute: 0 10*3/uL (ref 0.0–0.5)
HGB: 9.8 g/dL — ABNORMAL LOW (ref 13.0–17.1)
MCV: 95 fL (ref 79.3–98.0)
MONO%: 6.6 % (ref 0.0–14.0)
NEUT#: 7.3 10*3/uL — ABNORMAL HIGH (ref 1.5–6.5)
RBC: 3.12 10*6/uL — ABNORMAL LOW (ref 4.20–5.82)
RDW: 21.9 % — ABNORMAL HIGH (ref 11.0–14.6)
lymph#: 0.9 10*3/uL (ref 0.9–3.3)

## 2011-01-18 ENCOUNTER — Other Ambulatory Visit: Payer: Self-pay | Admitting: Hematology and Oncology

## 2011-01-18 ENCOUNTER — Encounter (HOSPITAL_BASED_OUTPATIENT_CLINIC_OR_DEPARTMENT_OTHER): Payer: Medicare Other | Admitting: Hematology and Oncology

## 2011-01-18 DIAGNOSIS — C182 Malignant neoplasm of ascending colon: Secondary | ICD-10-CM

## 2011-01-18 DIAGNOSIS — Z5111 Encounter for antineoplastic chemotherapy: Secondary | ICD-10-CM

## 2011-01-18 LAB — CBC WITH DIFFERENTIAL/PLATELET
BASO%: 0.4 % (ref 0.0–2.0)
EOS%: 0.4 % (ref 0.0–7.0)
MCH: 29.9 pg (ref 27.2–33.4)
MCV: 96.3 fL (ref 79.3–98.0)
MONO%: 13.3 % (ref 0.0–14.0)
RBC: 3.21 10*6/uL — ABNORMAL LOW (ref 4.20–5.82)
RDW: 20.9 % — ABNORMAL HIGH (ref 11.0–14.6)
lymph#: 1 10*3/uL (ref 0.9–3.3)
nRBC: 0 % (ref 0–0)

## 2011-01-20 ENCOUNTER — Encounter (HOSPITAL_BASED_OUTPATIENT_CLINIC_OR_DEPARTMENT_OTHER): Payer: Medicare Other | Admitting: Hematology and Oncology

## 2011-01-20 DIAGNOSIS — C182 Malignant neoplasm of ascending colon: Secondary | ICD-10-CM

## 2011-01-28 LAB — URINALYSIS, ROUTINE W REFLEX MICROSCOPIC
Ketones, ur: NEGATIVE mg/dL
Nitrite: NEGATIVE
Protein, ur: NEGATIVE mg/dL
Urobilinogen, UA: 0.2 mg/dL (ref 0.0–1.0)

## 2011-01-28 LAB — DIFFERENTIAL
Basophils Relative: 0 % (ref 0–1)
Eosinophils Absolute: 0 10*3/uL (ref 0.0–0.7)
Lymphs Abs: 0.8 10*3/uL (ref 0.7–4.0)
Neutro Abs: 2.1 10*3/uL (ref 1.7–7.7)
Neutrophils Relative %: 63 % (ref 43–77)

## 2011-01-28 LAB — BASIC METABOLIC PANEL
BUN: 11 mg/dL (ref 6–23)
Calcium: 9 mg/dL (ref 8.4–10.5)
Chloride: 105 mEq/L (ref 96–112)
Creatinine, Ser: 1.03 mg/dL (ref 0.4–1.5)

## 2011-01-28 LAB — CBC
MCV: 97.2 fL (ref 78.0–100.0)
Platelets: 189 10*3/uL (ref 150–400)
WBC: 3.4 10*3/uL — ABNORMAL LOW (ref 4.0–10.5)

## 2011-01-31 ENCOUNTER — Other Ambulatory Visit: Payer: Self-pay | Admitting: Hematology and Oncology

## 2011-01-31 ENCOUNTER — Encounter (HOSPITAL_BASED_OUTPATIENT_CLINIC_OR_DEPARTMENT_OTHER): Payer: Medicare Other | Admitting: Hematology and Oncology

## 2011-01-31 DIAGNOSIS — C182 Malignant neoplasm of ascending colon: Secondary | ICD-10-CM

## 2011-01-31 DIAGNOSIS — Z5111 Encounter for antineoplastic chemotherapy: Secondary | ICD-10-CM

## 2011-01-31 LAB — COMPREHENSIVE METABOLIC PANEL
Albumin: 4.1 g/dL (ref 3.5–5.2)
CO2: 24 mEq/L (ref 19–32)
Glucose, Bld: 386 mg/dL — ABNORMAL HIGH (ref 70–99)
Potassium: 4 mEq/L (ref 3.5–5.3)
Sodium: 134 mEq/L — ABNORMAL LOW (ref 135–145)
Total Protein: 6.7 g/dL (ref 6.0–8.3)

## 2011-01-31 LAB — CBC WITH DIFFERENTIAL/PLATELET
Eosinophils Absolute: 0 10*3/uL (ref 0.0–0.5)
MONO#: 0.6 10*3/uL (ref 0.1–0.9)
NEUT#: 7.4 10*3/uL — ABNORMAL HIGH (ref 1.5–6.5)
Platelets: 80 10*3/uL — ABNORMAL LOW (ref 140–400)
RBC: 3.02 10*6/uL — ABNORMAL LOW (ref 4.20–5.82)
RDW: 22.8 % — ABNORMAL HIGH (ref 11.0–14.6)
WBC: 8.6 10*3/uL (ref 4.0–10.3)

## 2011-02-01 ENCOUNTER — Encounter (HOSPITAL_BASED_OUTPATIENT_CLINIC_OR_DEPARTMENT_OTHER): Payer: Medicare Other | Admitting: Hematology and Oncology

## 2011-02-01 DIAGNOSIS — C182 Malignant neoplasm of ascending colon: Secondary | ICD-10-CM

## 2011-02-01 DIAGNOSIS — Z5111 Encounter for antineoplastic chemotherapy: Secondary | ICD-10-CM

## 2011-02-03 ENCOUNTER — Encounter (HOSPITAL_BASED_OUTPATIENT_CLINIC_OR_DEPARTMENT_OTHER): Payer: Medicare Other | Admitting: Hematology and Oncology

## 2011-02-03 DIAGNOSIS — C182 Malignant neoplasm of ascending colon: Secondary | ICD-10-CM

## 2011-02-14 ENCOUNTER — Inpatient Hospital Stay (INDEPENDENT_AMBULATORY_CARE_PROVIDER_SITE_OTHER)
Admission: RE | Admit: 2011-02-14 | Discharge: 2011-02-14 | Disposition: A | Discharge status: Home / Self Care | Payer: Medicare Other | Source: Ambulatory Visit | Attending: Emergency Medicine | Admitting: Emergency Medicine

## 2011-02-14 ENCOUNTER — Other Ambulatory Visit: Payer: Self-pay | Admitting: Hematology and Oncology

## 2011-02-14 ENCOUNTER — Encounter (HOSPITAL_BASED_OUTPATIENT_CLINIC_OR_DEPARTMENT_OTHER): Payer: Medicare Other | Admitting: Hematology and Oncology

## 2011-02-14 DIAGNOSIS — Z5111 Encounter for antineoplastic chemotherapy: Secondary | ICD-10-CM

## 2011-02-14 DIAGNOSIS — R7989 Other specified abnormal findings of blood chemistry: Secondary | ICD-10-CM

## 2011-02-14 DIAGNOSIS — C61 Malignant neoplasm of prostate: Secondary | ICD-10-CM

## 2011-02-14 DIAGNOSIS — C182 Malignant neoplasm of ascending colon: Secondary | ICD-10-CM

## 2011-02-14 DIAGNOSIS — C189 Malignant neoplasm of colon, unspecified: Secondary | ICD-10-CM

## 2011-02-14 LAB — COMPREHENSIVE METABOLIC PANEL
ALT: 19 U/L (ref 0–53)
Albumin: 3.9 g/dL (ref 3.5–5.2)
CO2: 25 mEq/L (ref 19–32)
Calcium: 8.8 mg/dL (ref 8.4–10.5)
Chloride: 99 mEq/L (ref 96–112)
Glucose, Bld: 422 mg/dL — ABNORMAL HIGH (ref 70–99)
Potassium: 4.4 mEq/L (ref 3.5–5.3)
Sodium: 133 mEq/L — ABNORMAL LOW (ref 135–145)
Total Protein: 6.4 g/dL (ref 6.0–8.3)

## 2011-02-14 LAB — CBC WITH DIFFERENTIAL/PLATELET
Eosinophils Absolute: 0 10*3/uL (ref 0.0–0.5)
MONO#: 0.4 10*3/uL (ref 0.1–0.9)
MONO%: 19.9 % — ABNORMAL HIGH (ref 0.0–14.0)
NEUT#: 1.1 10*3/uL — ABNORMAL LOW (ref 1.5–6.5)
RBC: 2.82 10*6/uL — ABNORMAL LOW (ref 4.20–5.82)
RDW: 22.3 % — ABNORMAL HIGH (ref 11.0–14.6)
WBC: 1.9 10*3/uL — ABNORMAL LOW (ref 4.0–10.3)

## 2011-02-14 LAB — GLUCOSE, CAPILLARY: Glucose-Capillary: 361 mg/dL — ABNORMAL HIGH (ref 70–99)

## 2011-02-22 ENCOUNTER — Other Ambulatory Visit: Payer: Self-pay | Admitting: Hematology and Oncology

## 2011-02-22 ENCOUNTER — Encounter (HOSPITAL_BASED_OUTPATIENT_CLINIC_OR_DEPARTMENT_OTHER): Payer: Medicare Other | Admitting: Hematology and Oncology

## 2011-02-22 DIAGNOSIS — Z5111 Encounter for antineoplastic chemotherapy: Secondary | ICD-10-CM

## 2011-02-22 DIAGNOSIS — C182 Malignant neoplasm of ascending colon: Secondary | ICD-10-CM

## 2011-02-22 LAB — CBC WITH DIFFERENTIAL/PLATELET
BASO%: 1.1 % (ref 0.0–2.0)
EOS%: 1.1 % (ref 0.0–7.0)
HCT: 27.9 % — ABNORMAL LOW (ref 38.4–49.9)
MCH: 28.5 pg (ref 27.2–33.4)
MCHC: 31.2 g/dL — ABNORMAL LOW (ref 32.0–36.0)
MONO#: 0.7 10*3/uL (ref 0.1–0.9)
NEUT%: 44 % (ref 39.0–75.0)
RBC: 3.05 10*6/uL — ABNORMAL LOW (ref 4.20–5.82)
RDW: 19.2 % — ABNORMAL HIGH (ref 11.0–14.6)
WBC: 2.8 10*3/uL — ABNORMAL LOW (ref 4.0–10.3)
lymph#: 0.8 10*3/uL — ABNORMAL LOW (ref 0.9–3.3)
nRBC: 0 % (ref 0–0)

## 2011-02-24 ENCOUNTER — Encounter (HOSPITAL_BASED_OUTPATIENT_CLINIC_OR_DEPARTMENT_OTHER): Payer: Medicare Other | Admitting: Hematology and Oncology

## 2011-02-24 DIAGNOSIS — C182 Malignant neoplasm of ascending colon: Secondary | ICD-10-CM

## 2011-03-07 NOTE — Consult Note (Signed)
[name], [name]               ACCOUNT NO.:  [id number]   MEDICAL RECORD NO.:  [id number]          PATIENT TYPE:  INP   LOCATION:  1415                         FACILITY:  [facility]   PHYSICIAN:  Pramod P. Pearlean Brownie, MD    DATE OF BIRTH:  09/23/1931   DATE OF CONSULTATION:  DATE OF DISCHARGE:                                 CONSULTATION   REFERRING PHYSICIAN:  Osvaldo Shipper. Spruill, M.D.   REASON FOR REFERRAL:  Stroke.   HISTORY OF PRESENT ILLNESS:  [name] is a 77 year old African  American gentleman who woke up from sleep 3 days ago with sudden onset  of left facial droop and slurred speech.  He denied any headache,  extremity weakness, gait or balance problems.  His symptoms lasted  around 3-4 hours and recovered by the time he came to the hospital.  He  has been admitted and has remained stable for the last 2 days and he has  undergone workup including a brain MRI scan, which had shown a right  frontal infarct.  He denies any prior history of stroke, TIA, seizures  or significant neurological problems.   PAST MEDICAL HISTORY:  1. Diabetes.  2. Coronary artery disease.  3. Hypertension.   HOME MEDICATIONS:  Diabeta, Catapres, Valtrex and Ativan.  He was on  aspirin in the past but had stopped it.   FAMILY HISTORY:  Positive for diabetes in the mother but nobody with  strokes.   SOCIAL HISTORY:  The patient lives by himself.  Does not smoke or drink.  He is independent in activities of daily living.   REVIEW OF SYSTEMS:  Negative for any recent chest pain, fever, cough,  shortness of breath or diarrheal illness.   PHYSICAL EXAMINATION:  A pleasant African American elderly gentleman who  is at present not in distress.  He is afebrile today with a temperature of 98.6, pulse rate 56 per  minute, regular, respiratory rate 18 per minute, blood pressure 148/82.  The patient's sats are 98% on room air.  HEAD:  Nontraumatic.  NECK:  Supple.  There is no bruit.  ENT:  Exam is  unremarkable.  CARDIAC:  No murmur or gallop.  LUNGS:  Clear to auscultation.  ABDOMEN:  Soft, nontender.  NEUROLOGIC:  The patient is pleasant, awake, alert, cooperative.  There  is no aphasia, apraxia or dysarthria.  His eye movements are full-range.  He has very subtle left nasolabial fold asymmetry.  Tongue is midline.  Motor system exam reveals no upper extremity drift.  He has slightly  diminished fine finger movement in the left, he orbits the right over  the left upper extremity.  He has symmetric grip strength.  Deep tendon  reflexes are symmetric.  Plantars are downgoing.  Sensation is intact.  Gait was not tested.   DATA REVIEWED:  MRI scan of the brain done on April 29, 2008, shows right  frontal MCA branch infarct.  MRA was not done.  Carotid Dopplers are  pending.  Metabolic panel labs are normal.  Cardiac markers are negative  for acute ischemia.   IMPRESSION:  A 77 year old gentleman with a right middle cerebral artery  branch infarct, likely of embolic etiology but workup is still pending.   PLAN:  I would recommend continuing telemetry monitoring as well as  checking a 2-D echocardiogram as well as transesophageal echocardiogram  to identify a cardiac source of embolism.  Check MRA of the brain as  well as neck to rule out significant extracranial or intracranial  vascular stenosis.  Start him on Plavix 75 mg a day for secondary stroke  prevention.  Check fasting lipid profile, hemoglobin A1c and  homocysteine.  The patient prefers to go home over the weekend and have  the rest of his workup done as an outpatient.  I think this would be  reasonable to discharge him tomorrow and have an outpatient  transesophageal echocardiogram as well as CardioNet monitoring done  __________.  He will follow up with me in the office in 2 months or  earlier if necessary.   Thank you for the referral.           ______________________________  Sunny Schlein. Pearlean Brownie, MD     PPS/MEDQ   D:  05/01/2008  T:  05/01/2008  Job:  [id number]

## 2011-03-07 NOTE — H&P (Signed)
[name], [name]               ACCOUNT NO.:  [id number]   MEDICAL RECORD NO.:  [id number]          PATIENT TYPE:  INP   LOCATION:  1418                         FACILITY:  [facility]   PHYSICIAN:  Eric L. August Saucer, M.D.     DATE OF BIRTH:  1931/07/31   DATE OF ADMISSION:  12/28/2007  DATE OF DISCHARGE:                              HISTORY & PHYSICAL   CHIEF COMPLAINT:  Wheezing, tongue swelling and shortness of breath.   HISTORY OF PRESENT ILLNESS:  This is the first recent [facility] admission for this 77 year old, black male with a history of  diabetes mellitus, hypertension and hyperlipidemia.  He states he had  undergone a procedure involving biopsy of his prostate.  The patient  relates this was only a couple weeks ago.  He, thereafter, was given  antibiotics post procedure.  He states he did not tolerate the  medication and indeed, went to the emergency room for evaluation of  weakness.  Records available shows, however, this was actually on  January 12.  He was seen twice during that time.  The patient, however,  reports that he had not had problems with swelling of his tongue until 1  week prior to his presentation here.  He noted a mild rash on his tongue  as of 1 week ago.  Over the subsequent days, he noted progressive  swelling as well.  He came into the emergency room last night with  complaints of marked swelling of his tongue with some difficulty  breathing.  He was given Solu-Medrol with IV Benadryl and Pepcid.  He  did note marked reduction in the swelling thereafter.  Because of  persistence of this, to some extent, he was admitted for further  observation.   The patient denies any change in medications and no new eating habits.  The patient is a questionable historian due to inconsistencies in his  reported visits to the emergency room.   PAST MEDICAL HISTORY:  1. Longstanding hypertension.  2. Diabetes greater than 40 years, presently on oral agents.  3.  He does not smoke or drink.  No drug abuse history.  He is not      sexually active.   ALLERGIES:  PENICILLIN.   SOCIAL HISTORY:  The patient presently lives with his son who is a  Optician, dispensing.   CURRENT MEDICATIONS:  1. Catapres 0.2 mg p.o. daily by record.  2. DiaBeta 5 mg p.o. b.i.d.  3. Tenormin 50 mg daily.  4. Valtrex 500 mg p.o. daily.  5. Lescol 80 mg XL one daily.  6. Ativan 1 mg daily p.r.n.   Of note, the patient carried all of his medications in one bottle  without identification.   REVIEW OF SYSTEMS:  As noted above.   PHYSICAL EXAMINATION:  GENERAL:  He is presently a well-developed, well-  nourished, black male in no acute distress.  VITAL SIGNS:  Blood pressure of 150/72, pulse of 75, respiratory rate  24, temperature 98.8.  O2 saturations 98% on room air.  HEENT:  Head normocephalic, atraumatic without bruits.  Extraocular  muscles  intact.  Nonicteric.  Pupils equal and reactive.  TMs with  decreased light reflex bilaterally.  Nose and mouth without any edema.  He has a patch on the tongue itself.  Apparently, with reduction in  initial presenting swelling.  No other oral lesions appreciated.  NECK:  No posterior cervical nodes.  No anterior cervical nodes.  LUNGS:  Clear to auscultation.  No wheezes.  No E to A changes.  CARDIOVASCULAR:  Normal S1 and S2.  No S3.  ABDOMEN:  Soft.  No masses, tenderness.  EXTREMITIES:  Negative Homan's.  No edema.   LABORATORY DATA AND X-RAY FINDINGS:  CBC revealed WBC 9100, hemoglobin  14.6, hematocrit of 41.6; 84 polys, 10 lymphs, no eosinophils.  Electrolytes sodium 137, potassium 4.2, chloride 102, CO2 26, BUN 19,  creatinine of 1.51, glucose 120.  Calcium 9.4.  Streptococcus screen was  negative.   CT scan of the sinuses and pharynx was done showing no abnormal masses  appreciated.  CT of the head also showed no acute intracranial  abnormality.   IMPRESSION:  1. Progressive swelling of the tongue, rule out angioedema  versus      other.  2. Rule out oral candidiasis versus atypical allergic reaction to the      tongue.  This apparently had been progressive over at least a week      with marked worsening recently.  The patient is somewhat of a poor      historian.  Will need to get additional information regarding his      original procedure and subsequent complications thereafter.  3. Diabetes mellitus with reported 4-year duration.  This may allow      him to be somewhat immunocompromised.  4. History of hypertension.  5. Rule out benign prostatic hypertrophy versus other.  He was does      report having had a urological procedure per Dr. Brunilda Payor.  We will      need to obtain further information regarding this as well.  6. Rule out atypical allergic reaction/angioedema.  He is not on any      ACE inhibitors.  He has been on his present medications for an      extended period of time.  He denies any recent inconsistencies as      well.  This was need be followed closely.   PLAN:  The patient is admitted for further observation.  We will  continue him on Vistaril versus Benadryl IV with as needed.  We will  gradually restart his other medications at this time.  Monitor his  progress over the next 24 hours.  He will need an A1c.  Will consider  HIV testing as well.  Further therapy pending response to the above.  We  will place him on Magic Mouthwash empirically at this time as well as  Diflucan x1 pending his fungal smears.  Further therapy thereafter.           ______________________________  Lind Guest. August Saucer, M.D.     ELD/MEDQ  D:  12/29/2007  T:  12/30/2007  Job:  [id number]

## 2011-03-07 NOTE — Discharge Summary (Signed)
NAMEHEMAN, [name]               ACCOUNT NO.:  [id number]   MEDICAL RECORD NO.:  [id number]          PATIENT TYPE:  INP   LOCATION:  1415                         FACILITY:  [facility]   PHYSICIAN:  Ricki Rodriguez, M.D.  DATE OF BIRTH:  [birth year]   DATE OF ADMISSION:  04/29/2008  DATE OF DISCHARGE:  05/02/2008                               DISCHARGE SUMMARY   Patient admitted by Dr. Donia Guiles.  Patient discharged by Dr. Orpah Cobb.   FINAL DIAGNOSIS:  1. Right frontal temporal region acute stroke.  2. Hypertension.  3. Diabetes mellitus type 2.  4. Coronary artery disease.   DISCHARGE MEDICATIONS:  1. Aspirin 81 mg one p.o. daily.  2. Plavix 75 mg one daily.  3. Clonidine 0.2 mg one daily in the a.m.  4. Valtrex 400 mg one daily.  5. Zocor 40 mg one in the evening.  6. Glyburide 5 mg one in the morning.   DISCHARGE DIET:  Low-sodium, low-fat, heart healthy diet.   DISCHARGE ACTIVITY:  Patient to increase activity slowly.   SPECIAL INSTRUCTIONS:  Patient to have outpatient TTE in one to two  weeks by Dr. Orpah Cobb.   FOLLOWUP:  By Dr. Orpah Cobb in one week and by Dr. Donia Guiles in  two weeks.   HISTORY:  This 77 year old black male presented with two day history of  facial droop, thick speech which resolved in 24 hours.  Patient denied  any limb weakness or paresthesia or vision change.   PHYSICAL EXAMINATION:  Pulse 53, respirations 18, blood pressure 148/82,  temperature 98.6.  Patient was alert and oriented x3 with speech  initially thick, now clear, conversation appropriate, able to recall  three items without difficulty.  Minimal left facial asymmetry.  LUNGS:  Clear bilaterally.  HEART:  Normal S1-S2.  ABDOMEN:  Soft.  EXTREMITIES:  Negative edema, cyanosis, clubbing.  Strength 5/5  globally.   LABORATORY DATA:  Normal electrolytes, BUN, creatinine, sugar borderline  at 137, hemoglobin A1c slightly elevated, normal hemoglobin and  hematocrit,  WBC down slightly low at 3600, platelet count 175,000.   MRI showed a 2-3 cm infarct on right frontal parietal region.  CT of the  head was negative.   EKG revealed sinus bradycardia with septal infarct.   Echocardiogram showed normal LV systolic function with mild pulmonary  insufficiency.   HOSPITAL COURSE:  Patient was admitted to regular floor.  He had a  Neurology evaluation.  Patient's right insular and frontal parietal  stroke condition improved with a resolution of facial asymmetry and  dysarthria.  He was able to tolerate regular diet and did not require  any special physical therapy need, hence he was discharged home on May 02, 2008 in satisfactory condition with additional cardiac evaluation on  outpatient basis.      Ricki Rodriguez, M.D.  Electronically Signed     ASK/MEDQ  D:  05/02/2008  T:  05/02/2008  Job:  [id number]

## 2011-03-10 NOTE — Discharge Summary (Signed)
[name], [name]               ACCOUNT NO.:  [id number]   MEDICAL RECORD NO.:  [id number]          PATIENT TYPE:  INP   LOCATION:  1418                         FACILITY:  [facility]   PHYSICIAN:  Osvaldo Shipper. Spruill, M.D.DATE OF BIRTH:  Mar 06, 1931   DATE OF ADMISSION:  12/28/2007  DATE OF DISCHARGE:  01/02/2008                               DISCHARGE SUMMARY   ADMISSION/PROVISIONAL DIAGNOSES:  1. Tongue swelling.  2. Acute delirium.  3. Type 2 diabetes.  4. Uncontrolled hypertension.  5. Dyslipidemia.   DISCHARGE DIAGNOSES:  1. Angioneurotic edema.  2. Thrush.  3. Acute delirium.  4. Accident in home.  5. Type 2 diabetes, uncontrolled.  6. Hypertension.  7. Dyslipidemia.  8. Swelling in the head and neck.   BRIEF HISTORY AND REASON FOR ADMISSION:  This 77 year old gentleman who  was admitted in my absence by Dr. Willey Blade.  He presented with  complaint of tongue swelling and shortness of breath.  The patient  reports not having this difficulty until 1 week prior to his  presentation in the hospital.  He noted the mild rash was started over a  week ago.  His symptoms have progressed, and he developed severe tongue  swelling and difficulty breathing.  He was given Solu-Medrol, IV  Benadryl, and Pepcid and did note some improvement in this problem.  Due  to some persistence of his tongue swelling, he was subsequently admitted  to the hospital by Dr. Willey Blade.   LABORATORY STUDIES:  The patient's laboratory studies revealed the  following:  White blood cell count on initial presentation 9100,  hemoglobin 14.6, hematocrit 41.6, serum sodium 137 on admission,  potassium 4.2, chloride 102, CO2 content 26, glucose 120, and creatinine  1.51.  At discharge, his creatinine was 1.01, this was on December 30, 2007.  The patient's glomerular filtration rate was 45 L.  His hepatitis  profile was unremarkable.  He had HIV, was also nonreactive.  His  glucose in his urine was greater than 1000  on December 29, 2007.  His strep  A screen was negative.  His RPR was nonreactive and telemetry was  essentially unrevealing.   HOSPITAL COURSE:  After the patient was admitted to the telemetry, he  was started on IV fluids of normal saline 50 mL an hour, soft diet was  given as tolerated.  He was also started on Vistaril 50 mg q.i.d. as  needed.  Eventually, his diet was changed to an ADA mechanical soft.  Further baseline laboratory studies including CMET were obtained.   MEDICATIONS:  He was resumed on his home medications of:  1. Diabeta 5 mg p.o. b.i.d.  2. Catapres 0.2 mg p.o. daily.  3. Valtrex 500 mg p.o. daily.  4. Ativan 1 mg p.o. daily.  5. Upon further evaluation, the patient was found to have evidence of      thrush.  He was given Diflucan as one time dose by Dr. August Saucer.  6. The patient was also started on doxycycline 100 mg p.o. b.i.d. for      7 days, and his service was transferred  home on December 29, 2007.   Other laboratory studies were obtained and are included in the bottom of  the patient's chart.  He had a CT scan of the brain that was ordered,  and a diagnosis was confusion to exclude possibly encephalitis.  This  study returned back unremarkable.   The patient stabilized and was felt that he could be released home with  home health with further office evaluation in 1-2 weeks.  The patient is  felt to have had experienced angioedema with no real etiology found at  this point.   DISCHARGE MEDICATION:  1. Tenormin 50 mg p.o. q.a.m.  2. Diabeta 5 mg p.o. q.a.m.  3. Catapres 0.2 mg p.o. q.a.m.  4. Valtrex 500 mg p.o. q.a.m.  5. Lescol 80 mg p.o. q.a.m.  6. Nystatin solution for his mouth.  7. Ativan 1 mg p.o. t.i.d. p.r.n.   This 77 year old gentleman who was admitted to the hospital with  swelling of his tongue.  The exact etiology is not clear; however,  angioedema can occur in the normal population.  He has been treated  successfully.  He will be released with  followup in the office in 1  week.      Osvaldo Shipper. Spruill, M.D.  Electronically Signed     JOS/MEDQ  D:  02/05/2008  T:  02/06/2008  Job:  [id number]

## 2011-03-17 ENCOUNTER — Ambulatory Visit (HOSPITAL_COMMUNITY)
Admission: RE | Admit: 2011-03-17 | Discharge: 2011-03-17 | Disposition: A | Discharge status: Home / Self Care | Payer: Medicare Other | Source: Ambulatory Visit | Attending: Gastroenterology | Admitting: Gastroenterology

## 2011-03-17 DIAGNOSIS — Y842 Radiological procedure and radiotherapy as the cause of abnormal reaction of the patient, or of later complication, without mention of misadventure at the time of the procedure: Secondary | ICD-10-CM | POA: Insufficient documentation

## 2011-03-17 DIAGNOSIS — K649 Unspecified hemorrhoids: Secondary | ICD-10-CM | POA: Insufficient documentation

## 2011-03-17 DIAGNOSIS — K921 Melena: Secondary | ICD-10-CM | POA: Insufficient documentation

## 2011-03-17 DIAGNOSIS — K6289 Other specified diseases of anus and rectum: Secondary | ICD-10-CM | POA: Insufficient documentation

## 2011-03-21 ENCOUNTER — Encounter (HOSPITAL_COMMUNITY): Payer: Self-pay

## 2011-03-21 ENCOUNTER — Ambulatory Visit (HOSPITAL_COMMUNITY)
Admission: RE | Admit: 2011-03-21 | Discharge: 2011-03-21 | Disposition: A | Discharge status: Home / Self Care | Payer: Medicare Other | Source: Ambulatory Visit | Attending: Hematology and Oncology | Admitting: Hematology and Oncology

## 2011-03-21 DIAGNOSIS — N281 Cyst of kidney, acquired: Secondary | ICD-10-CM | POA: Insufficient documentation

## 2011-03-21 DIAGNOSIS — M5137 Other intervertebral disc degeneration, lumbosacral region: Secondary | ICD-10-CM | POA: Insufficient documentation

## 2011-03-21 DIAGNOSIS — Z923 Personal history of irradiation: Secondary | ICD-10-CM | POA: Insufficient documentation

## 2011-03-21 DIAGNOSIS — M51379 Other intervertebral disc degeneration, lumbosacral region without mention of lumbar back pain or lower extremity pain: Secondary | ICD-10-CM | POA: Insufficient documentation

## 2011-03-21 DIAGNOSIS — Z9221 Personal history of antineoplastic chemotherapy: Secondary | ICD-10-CM | POA: Insufficient documentation

## 2011-03-21 DIAGNOSIS — C189 Malignant neoplasm of colon, unspecified: Secondary | ICD-10-CM | POA: Insufficient documentation

## 2011-03-21 DIAGNOSIS — N62 Hypertrophy of breast: Secondary | ICD-10-CM | POA: Insufficient documentation

## 2011-03-21 HISTORY — DX: Malignant neoplasm of colon, unspecified: C18.9

## 2011-03-21 MED ORDER — IOHEXOL 300 MG/ML  SOLN
100.0000 mL | Freq: Once | INTRAMUSCULAR | Status: AC | PRN
  Administered 2011-03-21: 100 mL via INTRAVENOUS

## 2011-03-23 ENCOUNTER — Other Ambulatory Visit: Payer: Self-pay | Admitting: Hematology and Oncology

## 2011-03-23 ENCOUNTER — Encounter (HOSPITAL_BASED_OUTPATIENT_CLINIC_OR_DEPARTMENT_OTHER): Payer: Medicare Other | Admitting: Hematology and Oncology

## 2011-03-23 DIAGNOSIS — C189 Malignant neoplasm of colon, unspecified: Secondary | ICD-10-CM

## 2011-03-23 DIAGNOSIS — C61 Malignant neoplasm of prostate: Secondary | ICD-10-CM

## 2011-03-23 DIAGNOSIS — Z5111 Encounter for antineoplastic chemotherapy: Secondary | ICD-10-CM

## 2011-03-23 DIAGNOSIS — C182 Malignant neoplasm of ascending colon: Secondary | ICD-10-CM

## 2011-03-23 LAB — COMPREHENSIVE METABOLIC PANEL
ALT: 13 U/L (ref 0–53)
AST: 15 U/L (ref 0–37)
Creatinine, Ser: 1.07 mg/dL (ref 0.40–1.50)
Total Bilirubin: 0.4 mg/dL (ref 0.3–1.2)

## 2011-03-23 LAB — CBC WITH DIFFERENTIAL/PLATELET
BASO%: 0.7 % (ref 0.0–2.0)
EOS%: 1.4 % (ref 0.0–7.0)
LYMPH%: 24.9 % (ref 14.0–49.0)
MCH: 27.5 pg (ref 27.2–33.4)
MCHC: 30.7 g/dL — ABNORMAL LOW (ref 32.0–36.0)
MCV: 89.5 fL (ref 79.3–98.0)
MONO#: 0.5 10*3/uL (ref 0.1–0.9)
MONO%: 19.2 % — ABNORMAL HIGH (ref 0.0–14.0)
Platelets: 248 10*3/uL (ref 140–400)
RBC: 2.95 10*6/uL — ABNORMAL LOW (ref 4.20–5.82)
WBC: 2.8 10*3/uL — ABNORMAL LOW (ref 4.0–10.3)
nRBC: 0 % (ref 0–0)

## 2011-03-24 ENCOUNTER — Encounter (INDEPENDENT_AMBULATORY_CARE_PROVIDER_SITE_OTHER): Payer: Self-pay | Admitting: Surgery

## 2011-03-31 ENCOUNTER — Ambulatory Visit (HOSPITAL_COMMUNITY)
Admission: RE | Admit: 2011-03-31 | Discharge: 2011-03-31 | Disposition: A | Discharge status: Home / Self Care | Payer: Medicare Other | Source: Ambulatory Visit | Attending: Gastroenterology | Admitting: Gastroenterology

## 2011-03-31 DIAGNOSIS — Z85038 Personal history of other malignant neoplasm of large intestine: Secondary | ICD-10-CM | POA: Insufficient documentation

## 2011-03-31 DIAGNOSIS — Y842 Radiological procedure and radiotherapy as the cause of abnormal reaction of the patient, or of later complication, without mention of misadventure at the time of the procedure: Secondary | ICD-10-CM | POA: Insufficient documentation

## 2011-03-31 DIAGNOSIS — K6289 Other specified diseases of anus and rectum: Secondary | ICD-10-CM | POA: Insufficient documentation

## 2011-03-31 DIAGNOSIS — K625 Hemorrhage of anus and rectum: Secondary | ICD-10-CM | POA: Insufficient documentation

## 2011-03-31 LAB — GLUCOSE, CAPILLARY

## 2011-07-10 ENCOUNTER — Other Ambulatory Visit: Payer: Self-pay | Admitting: Hematology and Oncology

## 2011-07-10 ENCOUNTER — Other Ambulatory Visit (HOSPITAL_COMMUNITY): Payer: Medicare Other

## 2011-07-10 ENCOUNTER — Encounter (HOSPITAL_BASED_OUTPATIENT_CLINIC_OR_DEPARTMENT_OTHER): Payer: Medicare Other | Admitting: Hematology and Oncology

## 2011-07-10 DIAGNOSIS — C182 Malignant neoplasm of ascending colon: Secondary | ICD-10-CM

## 2011-07-10 LAB — CMP (CANCER CENTER ONLY)
ALT(SGPT): 31 U/L (ref 10–47)
CO2: 27 mEq/L (ref 18–33)
Calcium: 9 mg/dL (ref 8.0–10.3)
Chloride: 96 mEq/L — ABNORMAL LOW (ref 98–108)
Glucose, Bld: 380 mg/dL — ABNORMAL HIGH (ref 73–118)
Sodium: 137 mEq/L (ref 128–145)
Total Bilirubin: 0.5 mg/dl (ref 0.20–1.60)
Total Protein: 7 g/dL (ref 6.4–8.1)

## 2011-07-10 LAB — CBC WITH DIFFERENTIAL/PLATELET
Eosinophils Absolute: 0.1 10*3/uL (ref 0.0–0.5)
HCT: 40.7 % (ref 38.4–49.9)
LYMPH%: 16.7 % (ref 14.0–49.0)
MONO#: 0.4 10*3/uL (ref 0.1–0.9)
NEUT#: 3.4 10*3/uL (ref 1.5–6.5)
NEUT%: 72.7 % (ref 39.0–75.0)
Platelets: 164 10*3/uL (ref 140–400)
RBC: 4.44 10*6/uL (ref 4.20–5.82)
WBC: 4.7 10*3/uL (ref 4.0–10.3)
lymph#: 0.8 10*3/uL — ABNORMAL LOW (ref 0.9–3.3)

## 2011-07-10 LAB — CEA: CEA: 1.9 ng/mL (ref 0.0–5.0)

## 2011-07-12 ENCOUNTER — Ambulatory Visit (HOSPITAL_COMMUNITY)
Admission: RE | Admit: 2011-07-12 | Discharge: 2011-07-12 | Disposition: A | Discharge status: Home / Self Care | Payer: Medicare Other | Source: Ambulatory Visit | Attending: Hematology and Oncology | Admitting: Hematology and Oncology

## 2011-07-12 ENCOUNTER — Encounter (HOSPITAL_BASED_OUTPATIENT_CLINIC_OR_DEPARTMENT_OTHER): Payer: Medicare Other | Admitting: Hematology and Oncology

## 2011-07-12 DIAGNOSIS — Q619 Cystic kidney disease, unspecified: Secondary | ICD-10-CM | POA: Insufficient documentation

## 2011-07-12 DIAGNOSIS — C182 Malignant neoplasm of ascending colon: Secondary | ICD-10-CM

## 2011-07-12 DIAGNOSIS — I700 Atherosclerosis of aorta: Secondary | ICD-10-CM | POA: Insufficient documentation

## 2011-07-12 DIAGNOSIS — N62 Hypertrophy of breast: Secondary | ICD-10-CM | POA: Insufficient documentation

## 2011-07-12 DIAGNOSIS — C189 Malignant neoplasm of colon, unspecified: Secondary | ICD-10-CM | POA: Insufficient documentation

## 2011-07-12 DIAGNOSIS — C61 Malignant neoplasm of prostate: Secondary | ICD-10-CM | POA: Insufficient documentation

## 2011-07-12 DIAGNOSIS — I2510 Atherosclerotic heart disease of native coronary artery without angina pectoris: Secondary | ICD-10-CM | POA: Insufficient documentation

## 2011-07-12 MED ORDER — IOHEXOL 300 MG/ML  SOLN
100.0000 mL | Freq: Once | INTRAMUSCULAR | Status: AC | PRN
  Administered 2011-07-12: 100 mL via INTRAVENOUS

## 2011-07-13 LAB — POCT CARDIAC MARKERS
CKMB, poc: 5.3
CKMB, poc: 6.5
Myoglobin, poc: 453
Operator id: 3067
Operator id: 4001
Troponin i, poc: <0.05

## 2011-07-13 LAB — CBC
MCHC: 35.3
MCV: 96.1
MCV: 96.6
Platelets: 193
Platelets: 224
RBC: 4.49
WBC: 4.2
WBC: 4.7

## 2011-07-13 LAB — URINE MICROSCOPIC-ADD ON

## 2011-07-13 LAB — URINALYSIS, ROUTINE W REFLEX MICROSCOPIC
Glucose, UA: 250 — AB
Leukocytes, UA: NEGATIVE
Protein, ur: NEGATIVE
Specific Gravity, Urine: 1.011
pH: 6

## 2011-07-13 LAB — DIFFERENTIAL
Basophils Relative: 1
Eosinophils Absolute: 0
Eosinophils Absolute: 0
Lymphocytes Relative: 16
Lymphs Abs: 0.7
Monocytes Relative: 6
Neutro Abs: 3
Neutro Abs: 3.7
Neutrophils Relative %: 72
Neutrophils Relative %: 78 — ABNORMAL HIGH

## 2011-07-13 LAB — BASIC METABOLIC PANEL
BUN: 11
BUN: 13
CO2: 27
Calcium: 9.2
Calcium: 9.2
Chloride: 96
Chloride: 98
Creatinine, Ser: 1.17
Creatinine, Ser: 1.23
GFR calc Af Amer: >60
GFR calc Af Amer: >60
GFR calc non Af Amer: >60

## 2011-07-17 LAB — URINALYSIS, ROUTINE W REFLEX MICROSCOPIC
Specific Gravity, Urine: 1.018
Urobilinogen, UA: 0.2
pH: 6

## 2011-07-17 LAB — CBC
HCT: 35.8 — ABNORMAL LOW
Platelets: 200
Platelets: 209
RBC: 3.72 — ABNORMAL LOW
RBC: 4.31
WBC: 10.5
WBC: 9.1

## 2011-07-17 LAB — FUNGAL STAIN: Fungal Smear: NONE SEEN

## 2011-07-17 LAB — DIFFERENTIAL
Eosinophils Absolute: 0
Lymphocytes Relative: 10 — ABNORMAL LOW
Lymphs Abs: 0.9
Monocytes Relative: 6
Neutro Abs: 7.6
Neutrophils Relative %: 84 — ABNORMAL HIGH

## 2011-07-17 LAB — COMPREHENSIVE METABOLIC PANEL
AST: 20
Albumin: 3.4 — ABNORMAL LOW
Alkaline Phosphatase: 65
BUN: 17
CO2: 28
Chloride: 106
GFR calc Af Amer: >60
Potassium: 4.2
Total Bilirubin: 0.9

## 2011-07-17 LAB — BASIC METABOLIC PANEL
BUN: 19
Calcium: 9.4
Creatinine, Ser: 1.51 — ABNORMAL HIGH
GFR calc Af Amer: 55 — ABNORMAL LOW
GFR calc non Af Amer: 45 — ABNORMAL LOW

## 2011-07-17 LAB — RAPID STREP SCREEN (MED CTR MEBANE ONLY): Streptococcus, Group A Screen (Direct): NEGATIVE

## 2011-07-17 LAB — URINE MICROSCOPIC-ADD ON

## 2011-07-17 LAB — RPR: RPR Ser Ql: NONREACTIVE

## 2011-07-17 LAB — URINE CULTURE: Culture: NO GROWTH

## 2011-07-20 LAB — COMPREHENSIVE METABOLIC PANEL
AST: 22
Albumin: 3.2 — ABNORMAL LOW
Chloride: 106
Creatinine, Ser: 1.07
GFR calc Af Amer: >60
Potassium: 3.9
Total Bilirubin: 0.6

## 2011-07-20 LAB — POCT I-STAT, CHEM 8
Calcium, Ion: 1.17
HCT: 48
Hemoglobin: 16.3
TCO2: 27

## 2011-07-20 LAB — CBC
MCV: 97.3
Platelets: 175
WBC: 3.6 — ABNORMAL LOW

## 2011-07-20 LAB — POCT CARDIAC MARKERS
CKMB, poc: 1.9
Myoglobin, poc: 77.6
Operator id: 280141
Troponin i, poc: <0.05

## 2011-07-20 LAB — HOMOCYSTEINE: Homocysteine: 13.9

## 2011-08-27 ENCOUNTER — Other Ambulatory Visit: Payer: Self-pay | Admitting: Hematology and Oncology

## 2011-08-27 DIAGNOSIS — C180 Malignant neoplasm of cecum: Secondary | ICD-10-CM

## 2011-08-27 HISTORY — DX: Malignant neoplasm of cecum: C18.0

## 2011-09-04 ENCOUNTER — Other Ambulatory Visit: Payer: Self-pay | Admitting: Hematology and Oncology

## 2011-09-04 ENCOUNTER — Telehealth: Payer: Self-pay | Admitting: Hematology and Oncology

## 2011-09-04 DIAGNOSIS — C189 Malignant neoplasm of colon, unspecified: Secondary | ICD-10-CM

## 2011-09-04 MED ORDER — HEPARIN SOD (PORK) LOCK FLUSH 100 UNIT/ML IV SOLN
500.0000 [IU] | Freq: Once | INTRAVENOUS | Status: DC
  Filled 2011-09-04: qty 5

## 2011-09-04 MED ORDER — ALTEPLASE 2 MG IJ SOLR
2.0000 mg | Freq: Once | INTRAMUSCULAR | Status: DC | PRN
  Filled 2011-09-04: qty 2

## 2011-09-04 MED ORDER — SODIUM CHLORIDE 0.9 % IJ SOLN
10.0000 mL | INTRAMUSCULAR | Status: DC | PRN
  Filled 2011-09-04: qty 10

## 2011-09-04 NOTE — Telephone Encounter (Signed)
Added flush appt for 12/26. S/w pt he will get schedule 11/14.

## 2011-09-06 ENCOUNTER — Ambulatory Visit (HOSPITAL_BASED_OUTPATIENT_CLINIC_OR_DEPARTMENT_OTHER): Payer: Medicare Other

## 2011-09-06 VITALS — BP 156/83 | HR 68 | Temp 97.4°F

## 2011-09-06 DIAGNOSIS — C180 Malignant neoplasm of cecum: Secondary | ICD-10-CM

## 2011-09-06 DIAGNOSIS — Z452 Encounter for adjustment and management of vascular access device: Secondary | ICD-10-CM

## 2011-09-06 DIAGNOSIS — C182 Malignant neoplasm of ascending colon: Secondary | ICD-10-CM

## 2011-09-06 MED ORDER — HEPARIN SOD (PORK) LOCK FLUSH 100 UNIT/ML IV SOLN
500.0000 [IU] | Freq: Once | INTRAVENOUS | Status: AC | PRN
  Administered 2011-09-06: 500 [IU] via INTRAVENOUS
  Filled 2011-09-06: qty 5

## 2011-09-06 MED ORDER — SODIUM CHLORIDE 0.9 % IJ SOLN
10.0000 mL | INTRAMUSCULAR | Status: DC | PRN
  Administered 2011-09-06: 10 mL via INTRAVENOUS
  Filled 2011-09-06: qty 10

## 2011-09-11 ENCOUNTER — Ambulatory Visit: Payer: Medicare Other

## 2011-09-26 ENCOUNTER — Other Ambulatory Visit: Payer: Self-pay | Admitting: Cardiology

## 2011-09-28 ENCOUNTER — Encounter (INDEPENDENT_AMBULATORY_CARE_PROVIDER_SITE_OTHER): Payer: Self-pay | Admitting: General Surgery

## 2011-10-02 ENCOUNTER — Encounter (INDEPENDENT_AMBULATORY_CARE_PROVIDER_SITE_OTHER): Payer: Self-pay | Admitting: General Surgery

## 2011-10-02 ENCOUNTER — Ambulatory Visit (INDEPENDENT_AMBULATORY_CARE_PROVIDER_SITE_OTHER): Payer: Medicare Other | Admitting: General Surgery

## 2011-10-02 VITALS — BP 128/76 | HR 68 | Temp 97.8°F | Resp 18 | Ht 69.0 in | Wt 183.2 lb

## 2011-10-02 DIAGNOSIS — C180 Malignant neoplasm of cecum: Secondary | ICD-10-CM

## 2011-10-02 NOTE — Patient Instructions (Signed)
Continue to follow-up with oncologist. ?

## 2011-10-02 NOTE — Progress Notes (Signed)
Subjective:     Patient ID: [name], male   DOB: 1931/10/19, 77 y.o.   MRN: [id number]  HPI The patient is a 77 year old black male who is now a year and 3 months out from a right colectomy for a T3 N2 right colon cancer. He has finished his adjuvant therapy. He feels well and has no complaints. His appetite is good and he is actually gained some weight. He has no abdominal pain. He has not noticed any blood in his stools.  Review of Systems  Constitutional: Negative.   HENT: Negative.   Eyes: Negative.   Respiratory: Negative.   Cardiovascular: Negative.   Gastrointestinal: Negative.   Genitourinary: Negative.   Musculoskeletal: Negative.   Skin: Negative.   Neurological: Negative.   Hematological: Negative.   Psychiatric/Behavioral: Negative.        Objective:   Physical Exam  Constitutional: He is oriented to person, place, and time. He appears well-developed and well-nourished.  HENT:  Head: Normocephalic and atraumatic.  Eyes: Conjunctivae and EOM are normal. Pupils are equal, round, and reactive to light.  Neck: Normal range of motion. Neck supple.  Cardiovascular: Normal rate, regular rhythm and normal heart sounds.   Pulmonary/Chest: Effort normal and breath sounds normal.  Abdominal: Soft. Bowel sounds are normal. He exhibits no mass. There is no tenderness.       No palpable groin or supraclavicular lymphadenopathy  Musculoskeletal: Normal range of motion.  Lymphadenopathy:    He has no cervical adenopathy.  Neurological: He is alert and oriented to person, place, and time.  Skin: Skin is warm and dry.  Psychiatric: He has a normal mood and affect. His behavior is normal.       Assessment:     One year and 3 months out from a right colectomy for colon cancer    Plan:     I have encouraged him to followup with his oncologist. We will plan to see him back in 6 months for another check.

## 2011-10-18 ENCOUNTER — Ambulatory Visit (HOSPITAL_BASED_OUTPATIENT_CLINIC_OR_DEPARTMENT_OTHER): Payer: Medicare Other

## 2011-10-18 VITALS — BP 132/77 | HR 70 | Temp 97.3°F

## 2011-10-18 DIAGNOSIS — C180 Malignant neoplasm of cecum: Secondary | ICD-10-CM

## 2011-10-18 DIAGNOSIS — Z469 Encounter for fitting and adjustment of unspecified device: Secondary | ICD-10-CM

## 2011-10-18 MED ORDER — HEPARIN SOD (PORK) LOCK FLUSH 100 UNIT/ML IV SOLN
500.0000 [IU] | Freq: Once | INTRAVENOUS | Status: AC | PRN
  Administered 2011-10-18: 500 [IU] via INTRAVENOUS
  Filled 2011-10-18: qty 5

## 2011-10-18 MED ORDER — SODIUM CHLORIDE 0.9 % IJ SOLN
10.0000 mL | INTRAMUSCULAR | Status: DC | PRN
  Filled 2011-10-18: qty 10

## 2011-10-21 ENCOUNTER — Telehealth: Payer: Self-pay | Admitting: Hematology and Oncology

## 2011-10-21 NOTE — Telephone Encounter (Signed)
S/w pt, gave appts 1/30 lab/flush @ 9.30am and 2/1 md appt at 12noon.

## 2011-10-24 ENCOUNTER — Other Ambulatory Visit: Payer: Self-pay | Admitting: Cardiology

## 2011-10-25 IMAGING — CT CT ABD-PELV W/ CM
2 of 5 series · 16 of 46 positions shown, 18 images · IV contrast (agent unspecified)
Comparison: Abdominal pelvic CT 05/11/2010.  Plain film chest
08/16/2010.  No prior chest CT.

CT CHEST

CLINICAL DATA: Colon cancer diagnosed [DATE].  Chemotherapy and
radiation therapy complete.  Follow-up.

CT CHEST, ABDOMEN AND PELVIS WITH CONTRAST
TECHNIQUE: Contiguous axial images of the chest abdomen and pelvis
were obtained after IV contrast administration.
Contrast: 100  ml Lmnipaque-5GG

[Series 2: cap with st · axial · 0.69mm/px · z∈[-610,-55]mm · 13 of 127 slices shown, 15 images]
[im 8/127  soft-tissue]
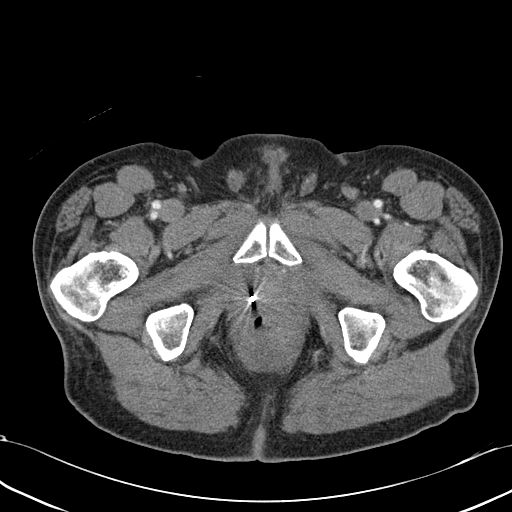
[im 8/127  bone]
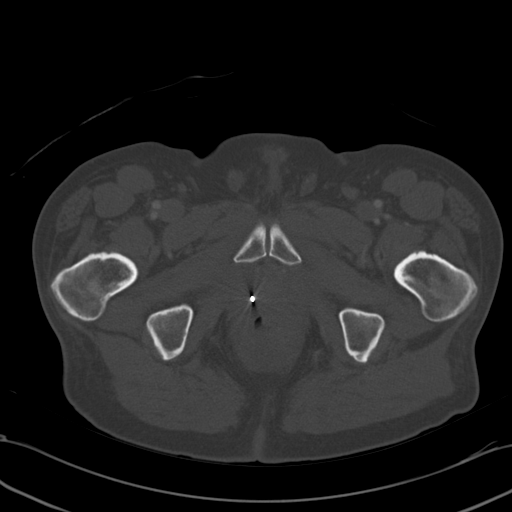
[im 15/127  soft-tissue]
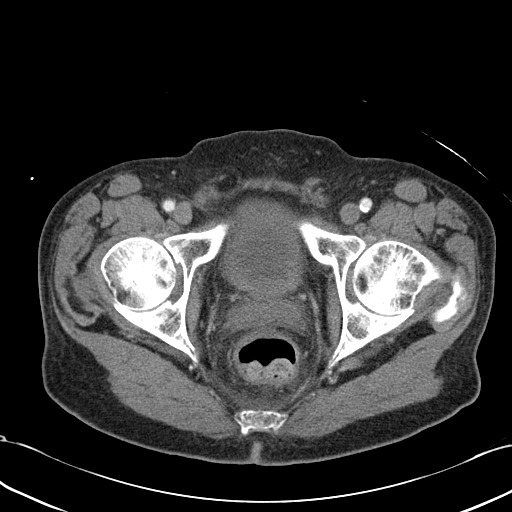
[im 30/127  soft-tissue]
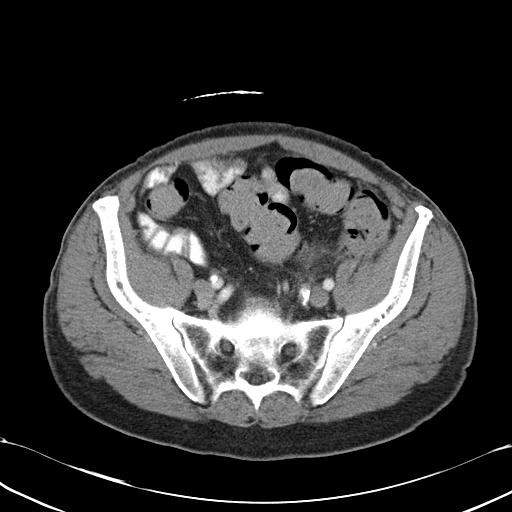
[im 38/127  soft-tissue]
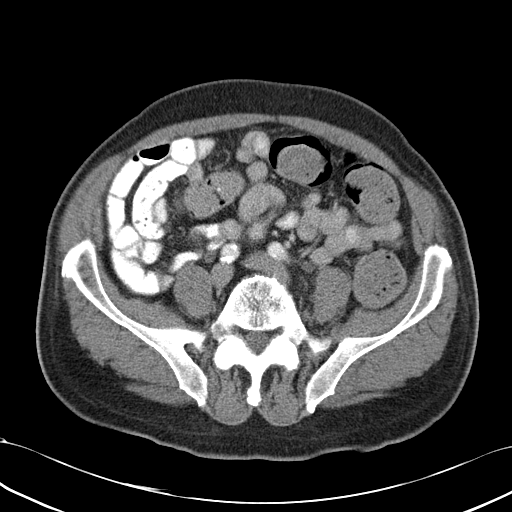
[im 45/127  soft-tissue]
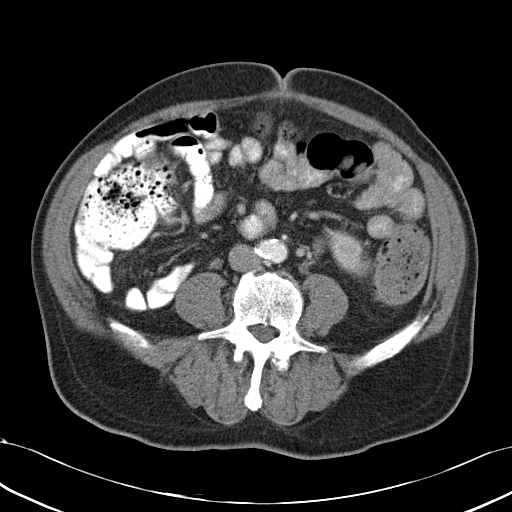
[im 52/127  soft-tissue]
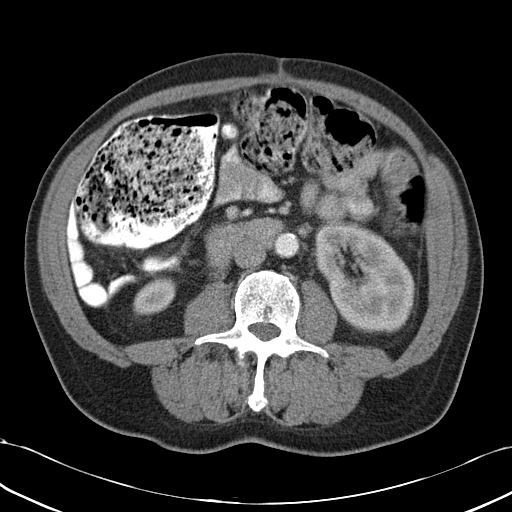
[im 67/127  soft-tissue]
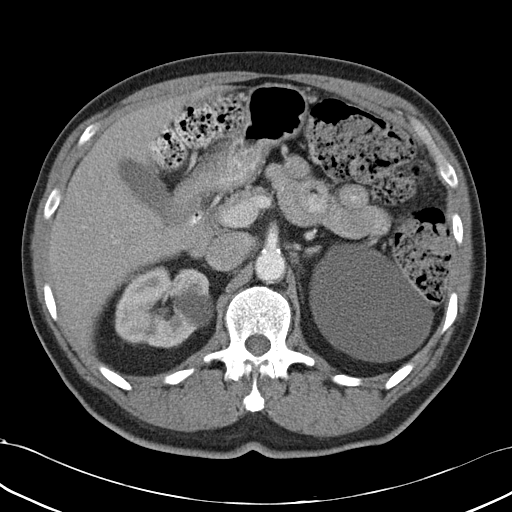
[im 75/127  soft-tissue]
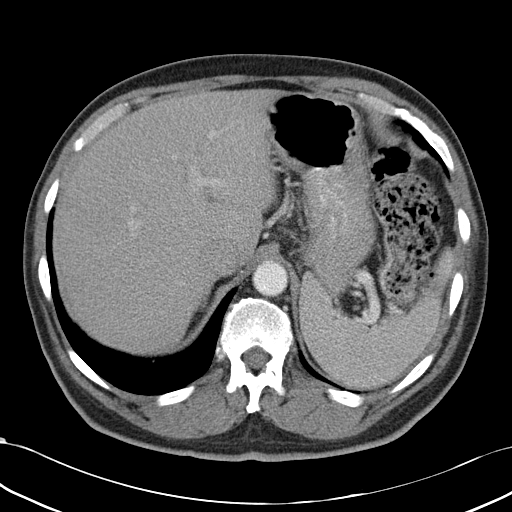
[im 82/127  soft-tissue]
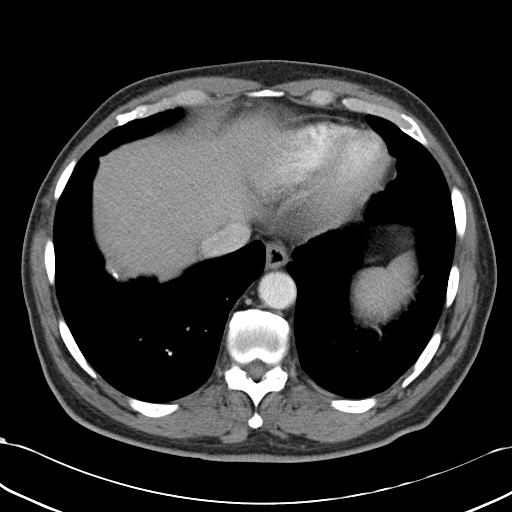
[im 82/127  bone]
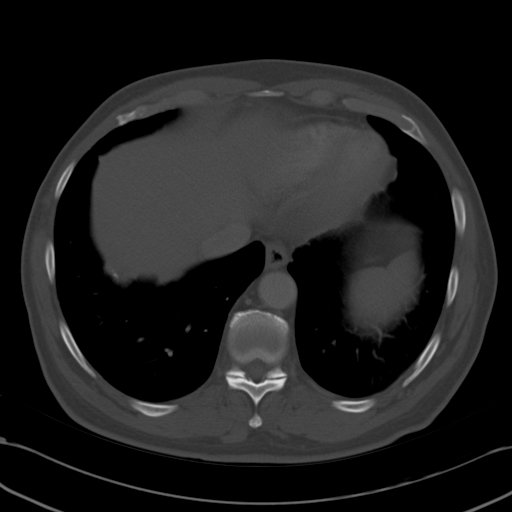
[im 89/127  soft-tissue]
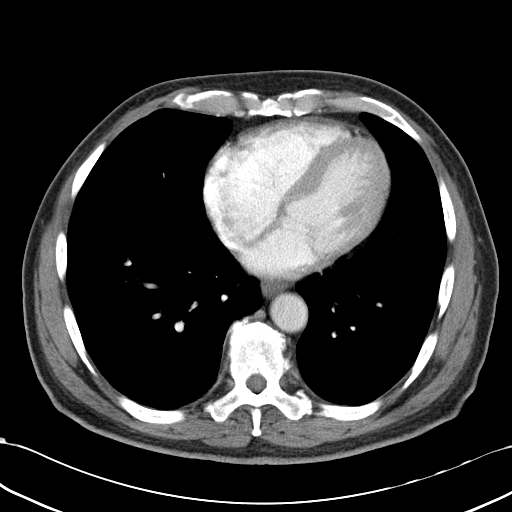
[im 97/127  soft-tissue]
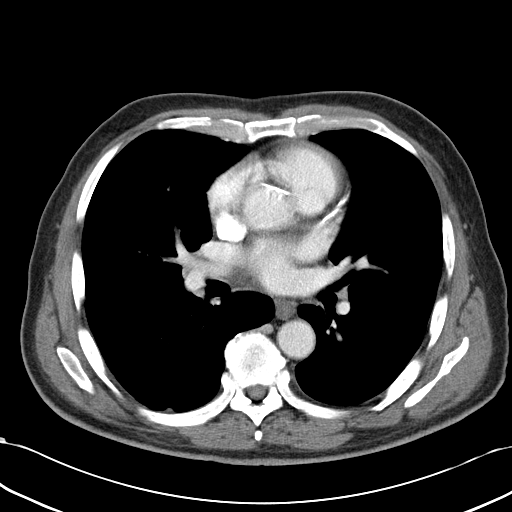
[im 112/127  soft-tissue]
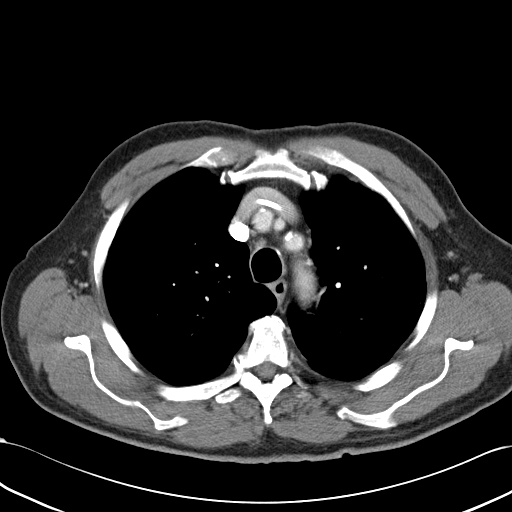
[im 119/127  soft-tissue]
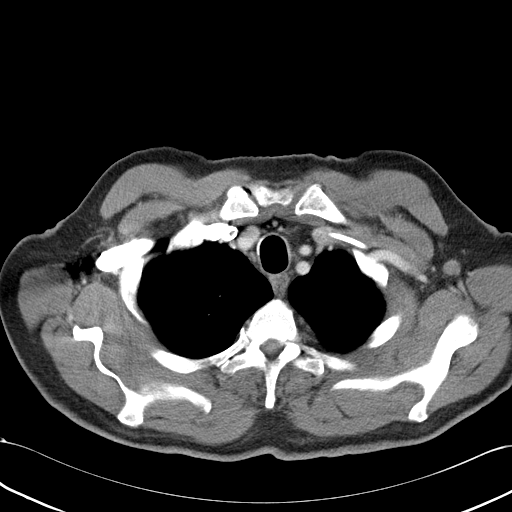

[Series 602: <mpr thick range> · coronal · 1.24mm/px · 3 of 100 slices shown]
[im 34/100  soft-tissue]
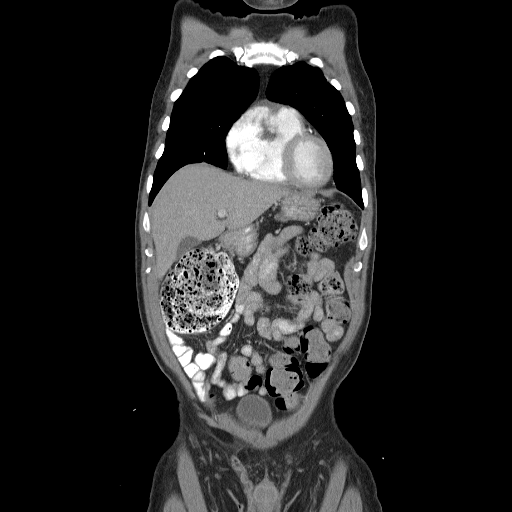
[im 45/100  soft-tissue]
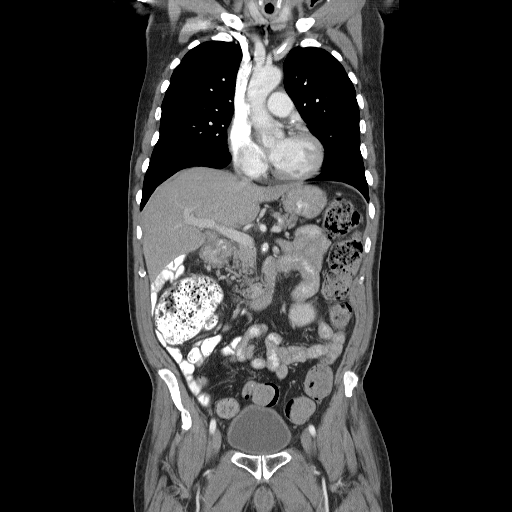
[im 56/100  soft-tissue]
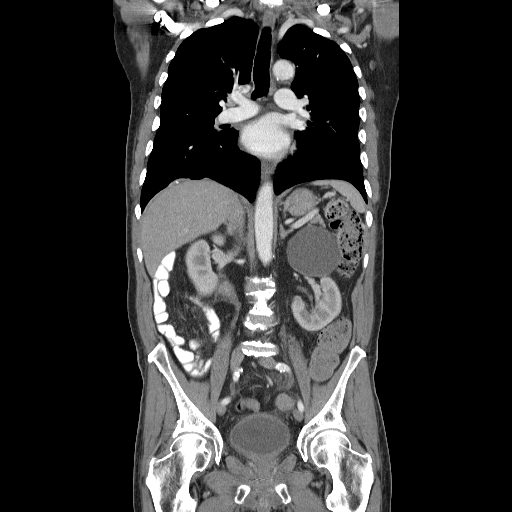

[16 of 46 positions shown; findings below may reference images not displayed]

FINDINGS: Lung windows demonstrate probable subpleural lymph node
along the right minor fissure at 3 mm on image 26.  Minimal
nodularity in the right middle lobe on image 33 measures
approximate 3 mm.
Minimal nodularity in the dependent right lower lobe on image 26,
is likely post infectious or inflammatory.

Soft tissue windows demonstrate a  left-sided Port-A-Cath which
terminates at the mid SVC.

Bilateral gynecomastia, mild.  Normal heart size with multivessel
coronary artery atherosclerosis.  Small middle mediastinal lymph
nodes without mediastinal or hilar adenopathy.  Minimal right-sided
pleural calcification which is nonspecific.
IMPRESSION: No acute process or evidence of metastatic disease in the chest.

CT ABDOMEN AND PELVIS
FINDINGS: Normal liver, spleen, stomach, pancreas, gallbladder,
biliary tract, adrenal glands.  Interpolar right and lower pole
left renal cysts.  A dominant upper pole left renal minimally
complex cyst measures 7.4 x 9.2 cm and fluid density.  Minimal
calcification within its wall.

No retroperitoneal or retrocrural adenopathy.

Colonic stool burden suggests constipation.

There is asymmetric wall thickening involving the anus on image
116.  Greater left than right.  Ill definition of
perianal/perirectal fat planes.  No surrounding adenopathy.
Normal terminal ileum. Normal small bowel without abdominal
ascites.

No pelvic adenopathy.  Edema within the perirectal space.  No
perirectal adenopathy.
Radiation seeds within the prostate. No significant free fluid.
Degenerative partial fusion of the right sacroiliac joint.
Degenerative disc disease involves L4-L5.
IMPRESSION: 1.  No evidence of distant metastasis.
2.  Anorectal wall thickening and surrounding edema.  This could
all be treatment related.  Residual tumor cannot be excluded.
3.  Probable constipation.

## 2011-11-15 ENCOUNTER — Other Ambulatory Visit: Payer: Self-pay | Admitting: Cardiology

## 2011-11-22 ENCOUNTER — Other Ambulatory Visit: Payer: Medicare Other

## 2011-11-22 ENCOUNTER — Ambulatory Visit (HOSPITAL_BASED_OUTPATIENT_CLINIC_OR_DEPARTMENT_OTHER): Payer: Medicare Other

## 2011-11-22 ENCOUNTER — Other Ambulatory Visit: Payer: Self-pay | Admitting: *Deleted

## 2011-11-22 DIAGNOSIS — C801 Malignant (primary) neoplasm, unspecified: Secondary | ICD-10-CM

## 2011-11-22 DIAGNOSIS — C189 Malignant neoplasm of colon, unspecified: Secondary | ICD-10-CM

## 2011-11-22 DIAGNOSIS — Z452 Encounter for adjustment and management of vascular access device: Secondary | ICD-10-CM

## 2011-11-22 LAB — CBC WITH DIFFERENTIAL/PLATELET
Basophils Absolute: 0 10*3/uL (ref 0.0–0.1)
HCT: 40 % (ref 38.4–49.9)
HGB: 13.9 g/dL (ref 13.0–17.1)
LYMPH%: 27.4 % (ref 14.0–49.0)
MCH: 35 pg — ABNORMAL HIGH (ref 27.2–33.4)
MONO#: 0.3 10*3/uL (ref 0.1–0.9)
NEUT%: 57.3 % (ref 39.0–75.0)
Platelets: 175 10*3/uL (ref 140–400)
WBC: 3.2 10*3/uL — ABNORMAL LOW (ref 4.0–10.3)
lymph#: 0.9 10*3/uL (ref 0.9–3.3)

## 2011-11-22 LAB — COMPREHENSIVE METABOLIC PANEL
BUN: 17 mg/dL (ref 6–23)
CO2: 28 mEq/L (ref 19–32)
Calcium: 9.3 mg/dL (ref 8.4–10.5)
Chloride: 102 mEq/L (ref 96–112)
Creatinine, Ser: 1.03 mg/dL (ref 0.50–1.35)
Total Bilirubin: 0.5 mg/dL (ref 0.3–1.2)

## 2011-11-22 LAB — CEA: CEA: 1.4 ng/mL (ref 0.0–5.0)

## 2011-11-22 MED ORDER — HEPARIN SOD (PORK) LOCK FLUSH 100 UNIT/ML IV SOLN
500.0000 [IU] | Freq: Once | INTRAVENOUS | Status: DC
  Administered 2011-11-22: 500 [IU] via INTRAVENOUS
  Filled 2011-11-22: qty 5

## 2011-11-22 MED ORDER — SODIUM CHLORIDE 0.9 % IJ SOLN
10.0000 mL | INTRAMUSCULAR | Status: DC | PRN
  Administered 2011-11-22: 10 mL via INTRAVENOUS
  Filled 2011-11-22: qty 10

## 2011-11-24 ENCOUNTER — Telehealth: Payer: Self-pay | Admitting: Nurse Practitioner

## 2011-11-24 ENCOUNTER — Ambulatory Visit: Payer: Medicare Other | Admitting: Hematology and Oncology

## 2011-11-24 NOTE — Telephone Encounter (Signed)
Spoke with patient.  Rescheduled 2/1 appt to 2/4.

## 2011-11-27 ENCOUNTER — Telehealth: Payer: Self-pay | Admitting: Hematology and Oncology

## 2011-11-27 ENCOUNTER — Ambulatory Visit (HOSPITAL_BASED_OUTPATIENT_CLINIC_OR_DEPARTMENT_OTHER): Payer: Medicare Other | Admitting: Hematology and Oncology

## 2011-11-27 VITALS — BP 135/76 | HR 65 | Temp 97.5°F | Ht 69.0 in | Wt 187.7 lb

## 2011-11-27 DIAGNOSIS — C189 Malignant neoplasm of colon, unspecified: Secondary | ICD-10-CM

## 2011-11-27 NOTE — Progress Notes (Signed)
This office note has been dictated.

## 2011-11-27 NOTE — Progress Notes (Signed)
CC:   [name]. Keith Howard, MD [name]. [name], M.D.  IDENTIFYING STATEMENT:  The patient is a 78 year old man with a history of colon cancer who presents for followup and to discuss the results of recent CT scans.  INTERVAL HISTORY:  [name] was last seen 6 months ago.  Within that time, he has had no major GI concerns.  He is without nausea, vomiting, abdominal pain, and diarrhea.  He has had no issues of rectal bleeding. His low abdominal pain is resolving.  His weight is stable.  MEDICATIONS:  Reviewed and updated.  ALLERGIES:  PENICILLIN.  PHYSICAL EXAMINATION:  General Appearance:  The patient is a well- appearing and well-nourished man in no distress.  Vital Signs:  Pulse 65.  Blood pressure 135/76.  Temperature 97.5.  Respirations 20.  Weight 187 pounds.  HEENT:  Head is atraumatic, normocephalic.  Sclerae are anicteric.  Mouth is moist.  Chest:  Clear.  CVS:  First and second heart sounds present.  No added sounds or murmurs.  Abdomen:  Soft, nontender.  Bowel sounds present.  Extremities:  No calf tenderness.  No edema.  Pulses present and symmetrical.  LABORATORY DATA:  On 10/26/2011, white cell count 3.2, hemoglobin 13.9, hematocrit 40, platelets 175.  Sodium 138, potassium 4.5, chloride 102, CO2 28, BUN 17, creatinine 1.03, t. bilirubin 0.5, alkaline phosphatase 95, AST 18, ALT 17.  IMPRESSION AND PLAN:  [name] is a 78 year old man who is status post laparoscopic-assisted right hemicolectomy on 07/04/2010 for stage T3 N2 (5/14 lymph nodes positive), moderately differentiated adenocarcinoma of the colon.  He is status post 6 months of adjuvant FOLFOX6 from 11/02.2011 through 02/14/2011.  [name]'s lab work and exam indicates no evidence of recurrence.  He follows up in 6 months' time with blood work to include a CT scan. His last full colonoscopy was performed on 05/17/2010  At the time of his diagnosis.  He is in need of one.  We will refer him to  Dr Haywood Pao  office.     ______________________________ Laurice Record, M.D. LIO/MEDQ  D:  11/27/2011  T:  11/27/2011  Job:  [id number]

## 2011-11-27 NOTE — Telephone Encounter (Signed)
appts made and printed for 8/12 and 8/14   aom

## 2011-11-28 ENCOUNTER — Telehealth: Payer: Self-pay | Admitting: Hematology and Oncology

## 2011-11-28 ENCOUNTER — Other Ambulatory Visit: Payer: Self-pay | Admitting: Hematology and Oncology

## 2011-11-28 DIAGNOSIS — C180 Malignant neoplasm of cecum: Secondary | ICD-10-CM

## 2011-11-28 NOTE — Telephone Encounter (Signed)
spoke with the [name] about seeing dr hung on 2/11/9:45 and he states he had this test already.  Message left for dr lo nurse to ck this and advise.  aom

## 2011-11-28 NOTE — Telephone Encounter (Signed)
per Thu/Dr Lo he needs to keep the Dr Elnoria Howard appt,pt is aware of this.   aom

## 2011-12-08 ENCOUNTER — Other Ambulatory Visit: Payer: Self-pay | Admitting: Cardiology

## 2011-12-28 ENCOUNTER — Ambulatory Visit (HOSPITAL_BASED_OUTPATIENT_CLINIC_OR_DEPARTMENT_OTHER): Payer: Medicare Other

## 2011-12-28 VITALS — BP 126/76 | HR 82 | Temp 98.7°F

## 2011-12-28 DIAGNOSIS — Z469 Encounter for fitting and adjustment of unspecified device: Secondary | ICD-10-CM

## 2011-12-28 DIAGNOSIS — C180 Malignant neoplasm of cecum: Secondary | ICD-10-CM

## 2011-12-28 MED ORDER — HEPARIN SOD (PORK) LOCK FLUSH 100 UNIT/ML IV SOLN
500.0000 [IU] | Freq: Once | INTRAVENOUS | Status: AC | PRN
  Administered 2011-12-28: 500 [IU] via INTRAVENOUS
  Filled 2011-12-28: qty 5

## 2011-12-28 MED ORDER — SODIUM CHLORIDE 0.9 % IJ SOLN
10.0000 mL | INTRAMUSCULAR | Status: DC | PRN
  Administered 2011-12-28: 10 mL via INTRAVENOUS
  Filled 2011-12-28: qty 10

## 2012-01-03 ENCOUNTER — Other Ambulatory Visit: Payer: Self-pay | Admitting: Cardiology

## 2012-02-07 ENCOUNTER — Other Ambulatory Visit: Payer: Self-pay | Admitting: Cardiology

## 2012-02-27 ENCOUNTER — Other Ambulatory Visit: Payer: Self-pay | Admitting: Cardiology

## 2012-03-25 ENCOUNTER — Encounter (INDEPENDENT_AMBULATORY_CARE_PROVIDER_SITE_OTHER): Payer: Self-pay | Admitting: General Surgery

## 2012-03-25 ENCOUNTER — Ambulatory Visit (INDEPENDENT_AMBULATORY_CARE_PROVIDER_SITE_OTHER): Payer: Medicare Other | Admitting: General Surgery

## 2012-03-25 VITALS — BP 130/73 | HR 72 | Temp 97.8°F | Ht 69.0 in | Wt 188.4 lb

## 2012-03-25 DIAGNOSIS — C180 Malignant neoplasm of cecum: Secondary | ICD-10-CM

## 2012-03-25 NOTE — Progress Notes (Signed)
Subjective:     Patient ID: [name], male   DOB: 04-07-1931, 78 y.o.   MRN: [id number]  HPI The patient is a 78 year old black male who is one year and 9 months out from a right colectomy for a T3 N2 right colon cancer. He is doing very well since his last office visit. He denies any abdominal pain. He is actually been able to gain some weight. His bowels were moving regularly and he has not noticed any blood in his stool.  Review of Systems  Constitutional: Negative.   HENT: Negative.   Eyes: Negative.   Respiratory: Negative.   Cardiovascular: Negative.   Gastrointestinal: Negative.   Genitourinary: Negative.   Musculoskeletal: Negative.   Skin: Negative.   Neurological: Negative.   Hematological: Negative.   Psychiatric/Behavioral: Negative.        Objective:   Physical Exam  Constitutional: He is oriented to person, place, and time. He appears well-developed and well-nourished.  HENT:  Head: Normocephalic and atraumatic.  Eyes: Conjunctivae and EOM are normal. Pupils are equal, round, and reactive to light.  Neck: Normal range of motion. Neck supple.  Cardiovascular: Normal rate, regular rhythm and normal heart sounds.   Pulmonary/Chest: Effort normal and breath sounds normal.  Abdominal: Soft. Bowel sounds are normal. He exhibits no distension and no mass. There is no tenderness.       There is no palpable groin or supraclavicular lymphadenopathy  Musculoskeletal: Normal range of motion.  Neurological: He is alert and oriented to person, place, and time.  Skin: Skin is warm and dry.  Psychiatric: He has a normal mood and affect. His behavior is normal.       Assessment:     One year 9 month status post right colectomy    Plan:     At this point he will continue to followup with his medical oncologist and gastroenterologist. We will plan to see him back in about 6 months.

## 2012-03-27 ENCOUNTER — Other Ambulatory Visit: Payer: Self-pay | Admitting: Cardiology

## 2012-04-18 ENCOUNTER — Ambulatory Visit: Payer: Medicare Other

## 2012-04-18 ENCOUNTER — Telehealth: Payer: Self-pay | Admitting: Hematology and Oncology

## 2012-04-18 NOTE — Telephone Encounter (Signed)
Pt walked in with lab order from dr Orlena Sheldon was not fasting today and it needs to be fasting.  So that all labs will comply with other appts pt has been r .s to 7/3!

## 2012-04-22 ENCOUNTER — Other Ambulatory Visit: Payer: Self-pay | Admitting: Cardiology

## 2012-04-24 ENCOUNTER — Other Ambulatory Visit (HOSPITAL_BASED_OUTPATIENT_CLINIC_OR_DEPARTMENT_OTHER): Payer: Medicare Other | Admitting: Lab

## 2012-04-24 DIAGNOSIS — C189 Malignant neoplasm of colon, unspecified: Secondary | ICD-10-CM

## 2012-04-24 LAB — COMPREHENSIVE METABOLIC PANEL
ALT: 15 U/L (ref 0–53)
BUN: 13 mg/dL (ref 6–23)
CO2: 28 mEq/L (ref 19–32)
Calcium: 9.5 mg/dL (ref 8.4–10.5)
Chloride: 104 mEq/L (ref 96–112)
Creatinine, Ser: 1.1 mg/dL (ref 0.50–1.35)
Glucose, Bld: 108 mg/dL — ABNORMAL HIGH (ref 70–99)

## 2012-04-24 LAB — CBC WITH DIFFERENTIAL/PLATELET
BASO%: 0.8 % (ref 0.0–2.0)
Basophils Absolute: 0 10*3/uL (ref 0.0–0.1)
Eosinophils Absolute: 0.1 10*3/uL (ref 0.0–0.5)
HCT: 42.7 % (ref 38.4–49.9)
HGB: 14.4 g/dL (ref 13.0–17.1)
LYMPH%: 28.8 % (ref 14.0–49.0)
MONO#: 0.3 10*3/uL (ref 0.1–0.9)
NEUT#: 1.4 10*3/uL — ABNORMAL LOW (ref 1.5–6.5)
NEUT%: 55.5 % (ref 39.0–75.0)
Platelets: 182 10*3/uL (ref 140–400)
WBC: 2.6 10*3/uL — ABNORMAL LOW (ref 4.0–10.3)
lymph#: 0.7 10*3/uL — ABNORMAL LOW (ref 0.9–3.3)

## 2012-04-24 LAB — CEA: CEA: 1 ng/mL (ref 0.0–5.0)

## 2012-04-26 ENCOUNTER — Other Ambulatory Visit: Payer: Medicare Other | Admitting: Lab

## 2012-05-03 ENCOUNTER — Other Ambulatory Visit: Payer: Self-pay | Admitting: Cardiology

## 2012-05-31 ENCOUNTER — Other Ambulatory Visit: Payer: Self-pay | Admitting: *Deleted

## 2012-05-31 DIAGNOSIS — C180 Malignant neoplasm of cecum: Secondary | ICD-10-CM

## 2012-06-03 ENCOUNTER — Ambulatory Visit (HOSPITAL_COMMUNITY)
Admission: RE | Admit: 2012-06-03 | Discharge: 2012-06-03 | Disposition: A | Discharge status: Home / Self Care | Payer: Medicare Other | Source: Ambulatory Visit | Attending: Hematology and Oncology | Admitting: Hematology and Oncology

## 2012-06-03 ENCOUNTER — Other Ambulatory Visit (HOSPITAL_BASED_OUTPATIENT_CLINIC_OR_DEPARTMENT_OTHER): Payer: Medicare Other

## 2012-06-03 DIAGNOSIS — C180 Malignant neoplasm of cecum: Secondary | ICD-10-CM

## 2012-06-03 DIAGNOSIS — R091 Pleurisy: Secondary | ICD-10-CM | POA: Insufficient documentation

## 2012-06-03 DIAGNOSIS — T578X1A Toxic effect of other specified inorganic substances, accidental (unintentional), initial encounter: Secondary | ICD-10-CM | POA: Insufficient documentation

## 2012-06-03 DIAGNOSIS — K4090 Unilateral inguinal hernia, without obstruction or gangrene, not specified as recurrent: Secondary | ICD-10-CM | POA: Insufficient documentation

## 2012-06-03 DIAGNOSIS — C61 Malignant neoplasm of prostate: Secondary | ICD-10-CM | POA: Insufficient documentation

## 2012-06-03 DIAGNOSIS — R918 Other nonspecific abnormal finding of lung field: Secondary | ICD-10-CM | POA: Insufficient documentation

## 2012-06-03 DIAGNOSIS — T65891A Toxic effect of other specified substances, accidental (unintentional), initial encounter: Secondary | ICD-10-CM | POA: Insufficient documentation

## 2012-06-03 DIAGNOSIS — C189 Malignant neoplasm of colon, unspecified: Secondary | ICD-10-CM

## 2012-06-03 DIAGNOSIS — I2510 Atherosclerotic heart disease of native coronary artery without angina pectoris: Secondary | ICD-10-CM | POA: Insufficient documentation

## 2012-06-03 LAB — COMPREHENSIVE METABOLIC PANEL
ALT: 14 U/L (ref 0–53)
AST: 16 U/L (ref 0–37)
Calcium: 9.7 mg/dL (ref 8.4–10.5)
Chloride: 102 mEq/L (ref 96–112)
Creatinine, Ser: 1.06 mg/dL (ref 0.50–1.35)
Sodium: 138 mEq/L (ref 135–145)
Total Protein: 6.8 g/dL (ref 6.0–8.3)

## 2012-06-03 LAB — CBC WITH DIFFERENTIAL/PLATELET
BASO%: 0.5 % (ref 0.0–2.0)
EOS%: 2.7 % (ref 0.0–7.0)
HCT: 39.3 % (ref 38.4–49.9)
MCH: 34.4 pg — ABNORMAL HIGH (ref 27.2–33.4)
MCHC: 34.4 g/dL (ref 32.0–36.0)
MONO#: 0.4 10*3/uL (ref 0.1–0.9)
NEUT%: 63.8 % (ref 39.0–75.0)
RBC: 3.92 10*6/uL — ABNORMAL LOW (ref 4.20–5.82)
RDW: 13.7 % (ref 11.0–14.6)
WBC: 3.2 10*3/uL — ABNORMAL LOW (ref 4.0–10.3)
lymph#: 0.7 10*3/uL — ABNORMAL LOW (ref 0.9–3.3)

## 2012-06-03 MED ORDER — IOHEXOL 300 MG/ML  SOLN
100.0000 mL | Freq: Once | INTRAMUSCULAR | Status: AC | PRN
  Administered 2012-06-03: 100 mL via INTRAVENOUS

## 2012-06-05 ENCOUNTER — Ambulatory Visit (HOSPITAL_BASED_OUTPATIENT_CLINIC_OR_DEPARTMENT_OTHER): Payer: Medicare Other | Admitting: Hematology and Oncology

## 2012-06-05 ENCOUNTER — Telehealth: Payer: Self-pay | Admitting: Hematology and Oncology

## 2012-06-05 ENCOUNTER — Encounter: Payer: Self-pay | Admitting: Hematology and Oncology

## 2012-06-05 VITALS — BP 122/72 | HR 78 | Temp 97.4°F | Resp 20 | Ht 69.0 in | Wt 186.7 lb

## 2012-06-05 DIAGNOSIS — C182 Malignant neoplasm of ascending colon: Secondary | ICD-10-CM

## 2012-06-05 DIAGNOSIS — E1129 Type 2 diabetes mellitus with other diabetic kidney complication: Secondary | ICD-10-CM

## 2012-06-05 DIAGNOSIS — C189 Malignant neoplasm of colon, unspecified: Secondary | ICD-10-CM

## 2012-06-05 DIAGNOSIS — I10 Essential (primary) hypertension: Secondary | ICD-10-CM | POA: Insufficient documentation

## 2012-06-05 DIAGNOSIS — C772 Secondary and unspecified malignant neoplasm of intra-abdominal lymph nodes: Secondary | ICD-10-CM

## 2012-06-05 HISTORY — DX: Type 2 diabetes mellitus with other diabetic kidney complication: E11.29

## 2012-06-05 NOTE — Progress Notes (Signed)
This office note has been dictated.

## 2012-06-05 NOTE — Progress Notes (Signed)
CC:   [name], M.D. [name]. [name], M.D. [name], [name]  IDENTIFYING STATEMENT:  The patient is a 78 year old man with colon cancer who presents for followup.  INTERVAL HISTORY:  [name] was seen 6 months ago.  Continues to enjoy good energy levels.  He has had no change in his bowels.  He denies weight loss.  He denies pain.  He denies nausea or vomiting.  Reviewed results of a recent CT scan of the chest, abdomen, and pelvis on 06/03/2012.  There was no evidence of metastatic disease within the chest  and diffuse scattered tiny pulmonary nodules were unchanged.  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  Abdomen and pelvis showed no evidence of metastatic disease.  Brachytherapy seeds were seen in the region of the prostate and there was a ventral abdominal wall incisional scar.  MEDICATIONS:  Reviewed and updated.  ALLERGIES:  Penicillin.  PAST MEDICAL HISTORY/FAMILY HISTORY/SOCIAL HISTORY:  Unchanged.  REVIEW OF SYSTEMS:  Ten point review of systems negative.  PHYSICAL EXAMINATION:  The patient is a well-appearing, well-nourished man in no distress.  Vitals:  Pulse 78, blood pressure 122/72, temperature 97.4, respirations 20, weight 186.7 pounds.  HEENT:  Head is atraumatic, normocephalic.  Sclerae anicteric.  Mouth is moist.  Chest: Clear to percussion and auscultation.  CVS:  First and second heart sounds present.  No added sounds or murmurs.  Abdomen:  Soft, nontender. Bowel sounds present.  Extremities:  No calf tenderness.  No edema. Pulses present symmetrical.  LABORATORY DATA:  06/03/2012:  White cell count 3.2, hemoglobin 13.5, hematocrit 39.3, platelets 199.  Sodium 138, potassium 4.4, chloride 102, CO2 is 28, BUN 15, creatinine 1.06, glucose 98, T-bilirubin 0.5, alkaline phosphatase 85, AST 16, ALT 14, calcium 9.7.  CEA 1.  IMPRESSION/PLAN:  [name] is a 78 year old man who is status post laparoscopic-assisted right  hemicolectomy on 07/04/2010 for stage T3 N2 (5/14 lymph nodes positive), moderate differentiated adenocarcinoma of the colon.  He is status post adjuvant FOLFOX6 from 08/24/2010 through 02/14/2011.  He received an endoscopy on 04/04/2011 with Dr. Elnoria Gutierrez which was unremarkable.  His current exam, blood work and CTs indicate no evidence of recurrence.  We will continue surveillance with follow up in 6 months' time with lab work.  He has a port, which he has decided to leave in for the time being, and will have every 6 weeks.   ______________________________ Laurice Record, M.D. LIO/MEDQ  D:  06/05/2012  T:  06/05/2012  Job:  [id number]

## 2012-06-05 NOTE — Telephone Encounter (Signed)
Gave pt appt calendar for port flush every 6 weeks and see ML with lab on 11/2012

## 2012-06-05 NOTE — Patient Instructions (Addendum)
[name]  [id number]  [facility] Discharge Instructions  RECOMMENDATIONS MADE BY THE CONSULTANT AND ANY TEST RESULTS WILL BE SENT TO YOUR REFERRING DOCTOR.   EXAM FINDINGS BY MD TODAY AND SIGNS AND SYMPTOMS TO REPORT TO CLINIC OR PRIMARY MD:   Your current list of medications are: Current Outpatient Prescriptions  Medication Sig Dispense Refill  . cloNIDine (CATAPRES) 0.2 MG tablet Take 0.2 mg by mouth daily.       Marland Kitchen Clopidogrel Bisulfate (PLAVIX PO) Take 75 mg by mouth every other day.       . ferrous sulfate 325 (65 FE) MG tablet Take 325 mg by mouth 2 (two) times daily.      Marland Kitchen glimepiride (AMARYL) 4 MG tablet Take 4 mg by mouth 2 (two) times daily.       . LESCOL XL 80 MG 24 hr tablet Take 80 mg by mouth.       Marland Kitchen lisinopril (PRINIVIL,ZESTRIL) 10 MG tablet Take 20 mg by mouth 2 (two) times daily.       . metFORMIN (GLUCOPHAGE) 500 MG tablet Take 500 mg by mouth 2 (two) times daily with a meal.       . ONE TOUCH ULTRA TEST test strip       . pioglitazone (ACTOS) 45 MG tablet Take 45 mg by mouth daily.       Marland Kitchen VALTREX 500 MG tablet Take 500 mg by mouth daily.       Marland Kitchen DISCONTD: Ferrous Sulfate 5 MG/20ML LIQD Take by mouth.          INSTRUCTIONS GIVEN AND DISCUSSED:   SPECIAL INSTRUCTIONS/FOLLOW-UP:  See above.  I acknowledge that I have been informed and understand all the instructions given to me and received a copy. I do not have any more questions at this time, but understand that I may call the [facility] at [phone] during business hours should I have any further questions or need assistance in obtaining follow-up care.

## 2012-06-07 ENCOUNTER — Other Ambulatory Visit: Payer: Self-pay | Admitting: Cardiology

## 2012-07-03 ENCOUNTER — Other Ambulatory Visit: Payer: Self-pay | Admitting: Cardiology

## 2012-07-17 ENCOUNTER — Ambulatory Visit (HOSPITAL_BASED_OUTPATIENT_CLINIC_OR_DEPARTMENT_OTHER): Payer: Medicare Other

## 2012-07-17 VITALS — BP 150/80 | HR 67 | Temp 97.8°F

## 2012-07-17 DIAGNOSIS — C189 Malignant neoplasm of colon, unspecified: Secondary | ICD-10-CM

## 2012-07-17 DIAGNOSIS — C182 Malignant neoplasm of ascending colon: Secondary | ICD-10-CM

## 2012-07-17 DIAGNOSIS — C772 Secondary and unspecified malignant neoplasm of intra-abdominal lymph nodes: Secondary | ICD-10-CM

## 2012-07-17 DIAGNOSIS — Z452 Encounter for adjustment and management of vascular access device: Secondary | ICD-10-CM

## 2012-07-17 MED ORDER — HEPARIN SOD (PORK) LOCK FLUSH 100 UNIT/ML IV SOLN
500.0000 [IU] | Freq: Once | INTRAVENOUS | Status: AC
  Administered 2012-07-17: 500 [IU] via INTRAVENOUS
  Filled 2012-07-17: qty 5

## 2012-07-17 MED ORDER — SODIUM CHLORIDE 0.9 % IJ SOLN
10.0000 mL | INTRAMUSCULAR | Status: DC | PRN
  Administered 2012-07-17: 10 mL via INTRAVENOUS
  Filled 2012-07-17: qty 10

## 2012-07-17 NOTE — Patient Instructions (Signed)
Call MD for problems 

## 2012-08-28 ENCOUNTER — Ambulatory Visit (HOSPITAL_BASED_OUTPATIENT_CLINIC_OR_DEPARTMENT_OTHER): Payer: Medicare Other

## 2012-08-28 VITALS — BP 133/65 | HR 60 | Temp 98.0°F

## 2012-08-28 DIAGNOSIS — C189 Malignant neoplasm of colon, unspecified: Secondary | ICD-10-CM

## 2012-08-28 DIAGNOSIS — C182 Malignant neoplasm of ascending colon: Secondary | ICD-10-CM

## 2012-08-28 DIAGNOSIS — Z452 Encounter for adjustment and management of vascular access device: Secondary | ICD-10-CM

## 2012-08-28 DIAGNOSIS — C772 Secondary and unspecified malignant neoplasm of intra-abdominal lymph nodes: Secondary | ICD-10-CM

## 2012-08-28 MED ORDER — HEPARIN SOD (PORK) LOCK FLUSH 100 UNIT/ML IV SOLN
500.0000 [IU] | Freq: Once | INTRAVENOUS | Status: AC
  Administered 2012-08-28: 500 [IU] via INTRAVENOUS
  Filled 2012-08-28: qty 5

## 2012-08-28 MED ORDER — SODIUM CHLORIDE 0.9 % IJ SOLN
10.0000 mL | INTRAMUSCULAR | Status: DC | PRN
  Administered 2012-08-28: 10 mL via INTRAVENOUS
  Filled 2012-08-28: qty 10

## 2012-08-28 NOTE — Patient Instructions (Signed)
Call MD for problems 

## 2012-10-09 ENCOUNTER — Other Ambulatory Visit: Payer: Self-pay | Admitting: Nurse Practitioner

## 2012-10-09 ENCOUNTER — Ambulatory Visit (HOSPITAL_BASED_OUTPATIENT_CLINIC_OR_DEPARTMENT_OTHER): Payer: Medicare Other

## 2012-10-09 VITALS — BP 152/75 | HR 73 | Temp 98.8°F

## 2012-10-09 DIAGNOSIS — Z452 Encounter for adjustment and management of vascular access device: Secondary | ICD-10-CM

## 2012-10-09 DIAGNOSIS — C189 Malignant neoplasm of colon, unspecified: Secondary | ICD-10-CM

## 2012-10-09 DIAGNOSIS — C182 Malignant neoplasm of ascending colon: Secondary | ICD-10-CM

## 2012-10-09 DIAGNOSIS — C772 Secondary and unspecified malignant neoplasm of intra-abdominal lymph nodes: Secondary | ICD-10-CM

## 2012-10-09 MED ORDER — SODIUM CHLORIDE 0.9 % IJ SOLN
10.0000 mL | INTRAMUSCULAR | Status: DC | PRN
  Administered 2012-10-09: 10 mL via INTRAVENOUS
  Filled 2012-10-09: qty 10

## 2012-10-09 MED ORDER — LIDOCAINE-PRILOCAINE 2.5-2.5 % EX CREA: TOPICAL_CREAM | CUTANEOUS | Status: DC | PRN

## 2012-10-09 MED ORDER — HEPARIN SOD (PORK) LOCK FLUSH 100 UNIT/ML IV SOLN
500.0000 [IU] | Freq: Once | INTRAVENOUS | Status: AC
  Administered 2012-10-09: 500 [IU] via INTRAVENOUS
  Filled 2012-10-09: qty 5

## 2012-10-22 ENCOUNTER — Ambulatory Visit (INDEPENDENT_AMBULATORY_CARE_PROVIDER_SITE_OTHER): Payer: Medicare Other | Admitting: General Surgery

## 2012-10-22 ENCOUNTER — Encounter (INDEPENDENT_AMBULATORY_CARE_PROVIDER_SITE_OTHER): Payer: Self-pay | Admitting: General Surgery

## 2012-10-22 VITALS — BP 148/82 | HR 78 | Temp 98.6°F | Resp 18 | Ht 69.0 in | Wt 189.1 lb

## 2012-10-22 DIAGNOSIS — C180 Malignant neoplasm of cecum: Secondary | ICD-10-CM

## 2012-10-22 NOTE — Progress Notes (Signed)
Subjective:     Patient ID: [name], male   DOB: 1931-07-08, 78 y.o.   MRN: [id number]  HPI The patient is a 79 her old black male who is one year and 3 months status post right colectomy for a T3 N2 right colon cancer. He has done well since his last visit. He has no complaints today. His appetite is good his bowels are working normally. He denies any blood in his stool. He had CT scans of his chest abdomen pelvis and within the last few months that were negative.  Review of Systems  Constitutional: Negative.   HENT: Negative.   Eyes: Negative.   Respiratory: Negative.   Cardiovascular: Negative.   Gastrointestinal: Negative.   Genitourinary: Negative.   Musculoskeletal: Negative.   Skin: Negative.   Neurological: Negative.   Hematological: Negative.   Psychiatric/Behavioral: Negative.        Objective:   Physical Exam  Constitutional: He is oriented to person, place, and time. He appears well-developed and well-nourished.  HENT:  Head: Normocephalic and atraumatic.  Eyes: Conjunctivae normal and EOM are normal. Pupils are equal, round, and reactive to light.  Neck: Normal range of motion. Neck supple.  Cardiovascular: Normal rate, regular rhythm and normal heart sounds.   Pulmonary/Chest: Effort normal and breath sounds normal.       There is no groin or supraclavicular lymphadenopathy  Abdominal: Soft. Bowel sounds are normal. He exhibits no mass. There is no tenderness.  Musculoskeletal: Normal range of motion.  Lymphadenopathy:    He has no cervical adenopathy.  Neurological: He is alert and oriented to person, place, and time.  Skin: Skin is warm and dry.  Psychiatric: He has a normal mood and affect. His behavior is normal.       Assessment:     1 year and 3 months status post right colectomy for colon cancer    Plan:     At this point I recommended that he return to see the gastroenterologist for a followup colonoscopy. He refuses. He will continue to  follow with his oncologist. We will plan to see him back in about 6 months.

## 2012-11-20 ENCOUNTER — Ambulatory Visit (HOSPITAL_BASED_OUTPATIENT_CLINIC_OR_DEPARTMENT_OTHER): Payer: Medicare Other

## 2012-11-20 ENCOUNTER — Telehealth: Payer: Self-pay | Admitting: Oncology

## 2012-11-20 VITALS — BP 141/79 | HR 87 | Temp 98.0°F

## 2012-11-20 DIAGNOSIS — C182 Malignant neoplasm of ascending colon: Secondary | ICD-10-CM

## 2012-11-20 DIAGNOSIS — Z452 Encounter for adjustment and management of vascular access device: Secondary | ICD-10-CM

## 2012-11-20 DIAGNOSIS — C189 Malignant neoplasm of colon, unspecified: Secondary | ICD-10-CM

## 2012-11-20 MED ORDER — HEPARIN SOD (PORK) LOCK FLUSH 100 UNIT/ML IV SOLN
500.0000 [IU] | Freq: Once | INTRAVENOUS | Status: AC
  Administered 2012-11-20: 500 [IU] via INTRAVENOUS
  Filled 2012-11-20: qty 5

## 2012-11-20 MED ORDER — SODIUM CHLORIDE 0.9 % IJ SOLN
10.0000 mL | INTRAMUSCULAR | Status: DC | PRN
  Administered 2012-11-20: 10 mL via INTRAVENOUS
  Filled 2012-11-20: qty 10

## 2012-11-20 NOTE — Patient Instructions (Signed)
Call MD for problems 

## 2012-11-20 NOTE — Telephone Encounter (Signed)
GVE THE PT HIS FEB AND MARCH 2014 APPT CALENDAR. THE PT HAS BEEN REASSIGNED FROM DR ODOGWU TO DR Clelia Croft.

## 2012-11-20 NOTE — Telephone Encounter (Signed)
Pt came in for flush appt today and has not yet been reassigned. Pt reassigned to [facility] and informed while at registration re new provider and new d/t for f/u. Pt is to keep 2/11 lb appt and see FS and have flush 3/13. Pt has new schedule.

## 2012-12-03 ENCOUNTER — Other Ambulatory Visit: Payer: Medicare Other | Admitting: Lab

## 2012-12-03 ENCOUNTER — Ambulatory Visit (HOSPITAL_BASED_OUTPATIENT_CLINIC_OR_DEPARTMENT_OTHER): Payer: Medicare Other | Admitting: Lab

## 2012-12-03 ENCOUNTER — Telehealth: Payer: Self-pay | Admitting: Oncology

## 2012-12-03 ENCOUNTER — Ambulatory Visit: Payer: Medicare Other

## 2012-12-03 VITALS — BP 162/84 | HR 73 | Temp 97.4°F | Resp 17

## 2012-12-03 DIAGNOSIS — C182 Malignant neoplasm of ascending colon: Secondary | ICD-10-CM

## 2012-12-03 DIAGNOSIS — C189 Malignant neoplasm of colon, unspecified: Secondary | ICD-10-CM

## 2012-12-03 DIAGNOSIS — C180 Malignant neoplasm of cecum: Secondary | ICD-10-CM

## 2012-12-03 LAB — CBC WITH DIFFERENTIAL/PLATELET
BASO%: 0.6 % (ref 0.0–2.0)
EOS%: 2.1 % (ref 0.0–7.0)
HCT: 38.7 % (ref 38.4–49.9)
LYMPH%: 22.3 % (ref 14.0–49.0)
MCH: 33.6 pg — ABNORMAL HIGH (ref 27.2–33.4)
MCHC: 33.9 g/dL (ref 32.0–36.0)
NEUT%: 65 % (ref 39.0–75.0)
Platelets: 161 10*3/uL (ref 140–400)
RBC: 3.91 10*6/uL — ABNORMAL LOW (ref 4.20–5.82)
lymph#: 0.8 10*3/uL — ABNORMAL LOW (ref 0.9–3.3)

## 2012-12-03 LAB — COMPREHENSIVE METABOLIC PANEL (CC13)
AST: 17 U/L (ref 5–34)
Alkaline Phosphatase: 88 U/L (ref 40–150)
BUN: 14.5 mg/dL (ref 7.0–26.0)
Creatinine: 1.1 mg/dL (ref 0.7–1.3)
Total Bilirubin: 0.45 mg/dL (ref 0.20–1.20)

## 2012-12-03 MED ORDER — SODIUM CHLORIDE 0.9 % IJ SOLN
10.0000 mL | INTRAMUSCULAR | Status: DC | PRN
  Administered 2012-12-03: 10 mL via INTRAVENOUS
  Filled 2012-12-03: qty 10

## 2012-12-03 MED ORDER — HEPARIN SOD (PORK) LOCK FLUSH 100 UNIT/ML IV SOLN
500.0000 [IU] | Freq: Once | INTRAVENOUS | Status: AC
  Administered 2012-12-03: 500 [IU] via INTRAVENOUS
  Filled 2012-12-03: qty 5

## 2012-12-03 NOTE — Telephone Encounter (Signed)
pt needed early appt...r/s to April for new pt.Marland KitchenMarland KitchenMarland KitchenDone

## 2012-12-05 ENCOUNTER — Ambulatory Visit: Payer: Medicare Other | Admitting: Family

## 2013-01-02 ENCOUNTER — Ambulatory Visit: Payer: Self-pay | Admitting: Oncology

## 2013-01-22 ENCOUNTER — Ambulatory Visit: Payer: Self-pay

## 2013-01-22 ENCOUNTER — Telehealth: Payer: Self-pay | Admitting: Oncology

## 2013-01-22 ENCOUNTER — Ambulatory Visit (HOSPITAL_BASED_OUTPATIENT_CLINIC_OR_DEPARTMENT_OTHER): Payer: Medicare Other | Admitting: Oncology

## 2013-01-22 VITALS — BP 132/73 | HR 76 | Temp 96.7°F | Resp 18 | Ht 69.0 in | Wt 187.8 lb

## 2013-01-22 DIAGNOSIS — C772 Secondary and unspecified malignant neoplasm of intra-abdominal lymph nodes: Secondary | ICD-10-CM

## 2013-01-22 DIAGNOSIS — C182 Malignant neoplasm of ascending colon: Secondary | ICD-10-CM

## 2013-01-22 DIAGNOSIS — C180 Malignant neoplasm of cecum: Secondary | ICD-10-CM

## 2013-01-22 MED ORDER — HEPARIN SOD (PORK) LOCK FLUSH 100 UNIT/ML IV SOLN
500.0000 [IU] | Freq: Once | INTRAVENOUS | Status: AC
  Administered 2013-01-22: 500 [IU] via INTRAVENOUS
  Filled 2013-01-22: qty 5

## 2013-01-22 MED ORDER — SODIUM CHLORIDE 0.9 % IJ SOLN
10.0000 mL | INTRAMUSCULAR | Status: AC | PRN
  Administered 2013-01-22: 10 mL via INTRAVENOUS
  Filled 2013-01-22: qty 10

## 2013-01-22 NOTE — Addendum Note (Signed)
Addended by: Reesa Chew on: 01/22/2013 10:43 AM   Modules accepted: Orders, SmartSet

## 2013-01-22 NOTE — Progress Notes (Signed)
Port flush done on MD side. 

## 2013-01-22 NOTE — Progress Notes (Signed)
Hematology and Oncology Follow Up Visit  [name] [id number] 1931/06/02 79 y.o. 01/22/2013 9:43 AM [name], MDHarwani, Keith Osier, MD   Principle Diagnosis: 79 year old man with colon cancer diagnosed in 06/2010. He had stage III (T3N2 disease).  Prior Therapy:  1. He is status post laparoscopic-assisted right hemicolectomy on 07/04/2010 for stage T3 N2 (5/14 lymph nodes positive), moderate differentiated adenocarcinoma of the colon.  2. He is status post adjuvant FOLFOX6 from 08/24/2010 through 02/14/2011.    Current therapy: Observation and follow up. No evidence of relapse at this point.   Interim History: Mr. [name] presents today for a follow up visit. He is a nice man with stage III colon cancer under surveillance. Since his last visit he has been well without any GI complaints. He continues to enjoy good energy levels. He has had no change in his bowels. He denies  weight loss. He denies pain. He denies nausea or vomiting.   Medications: I have reviewed the patient's current medications. Current outpatient prescriptions:cloNIDine (CATAPRES) 0.2 MG tablet, Take 0.2 mg by mouth daily. , Disp: , Rfl: ;  Clopidogrel Bisulfate (PLAVIX PO), Take 75 mg by mouth every other day. , Disp: , Rfl: ;  ferrous sulfate 325 (65 FE) MG tablet, Take 325 mg by mouth 2 (two) times daily., Disp: , Rfl: ;  glimepiride (AMARYL) 4 MG tablet, Take 4 mg by mouth 2 (two) times daily. , Disp: , Rfl:  LESCOL XL 80 MG 24 hr tablet, Take 80 mg by mouth. , Disp: , Rfl: ;  lidocaine-prilocaine (EMLA) cream, Apply topically as needed., Disp: 30 g, Rfl: 0;  lisinopril (PRINIVIL,ZESTRIL) 10 MG tablet, Take 20 mg by mouth 2 (two) times daily. , Disp: , Rfl: ;  metFORMIN (GLUCOPHAGE) 500 MG tablet, Take 500 mg by mouth 2 (two) times daily with a meal. , Disp: , Rfl: ;  ONE TOUCH ULTRA TEST test strip, , Disp: , Rfl:  pioglitazone (ACTOS) 45 MG tablet, Take 45 mg by mouth daily. , Disp: , Rfl: ;  VALTREX 500 MG tablet,  Take 500 mg by mouth daily. , Disp: , Rfl:   Allergies:  Allergies  Allergen Reactions  . Penicillins Swelling    Happened in childhood. Did not affect breathing.    Past Medical History, Surgical history, Social history, and Family History were reviewed and updated.  Review of Systems: Constitutional:  Negative for fever, chills, night sweats, anorexia, weight loss, pain. Cardiovascular: no chest pain or dyspnea on exertion Respiratory: negative Neurological: negative Dermatological: negative ENT: negative Skin: Negative. Gastrointestinal: negative Genito-Urinary: negative Hematological and Lymphatic: negative Breast: negative Musculoskeletal: negative Remaining ROS negative. Physical Exam: Blood pressure 132/73, pulse 76, temperature 96.7 F (35.9 C), temperature source Oral, resp. rate 18, height 5\' 9"  (1.753 m), weight 187 lb 12.8 oz (85.186 kg). ECOG: 0 General appearance: alert Head: Normocephalic, without obvious abnormality, atraumatic Neck: no adenopathy, no carotid bruit, no JVD, supple, symmetrical, trachea midline and thyroid not enlarged, symmetric, no tenderness/mass/nodules Lymph nodes: Cervical, supraclavicular, and axillary nodes normal. Heart:regular rate and rhythm, S1, S2 normal, no murmur, click, rub or gallop Lung:chest clear, no wheezing, rales, normal symmetric air entry Abdomin: soft, non-tender, without masses or organomegaly EXT:no erythema, induration, or nodules   Lab Results: Lab Results  Component Value Date   WBC 3.7* 12/03/2012   HGB 13.1 12/03/2012   HCT 38.7 12/03/2012   MCV 99.0* 12/03/2012   PLT 161 12/03/2012     Chemistry  Component Value Date/Time   NA 137 12/03/2012 1022   NA 138 06/03/2012 1008   NA 137 07/10/2011 1007   K 4.4 12/03/2012 1022   K 4.4 06/03/2012 1008   K 5.0* 07/10/2011 1007   CL 103 12/03/2012 1022   CL 102 06/03/2012 1008   CL 96* 07/10/2011 1007   CO2 28 12/03/2012 1022   CO2 28 06/03/2012 1008   CO2 27  07/10/2011 1007   BUN 14.5 12/03/2012 1022   BUN 15 06/03/2012 1008   BUN 12 07/10/2011 1007   CREATININE 1.1 12/03/2012 1022   CREATININE 1.06 06/03/2012 1008   CREATININE 1.0 07/10/2011 1007      Component Value Date/Time   CALCIUM 9.2 12/03/2012 1022   CALCIUM 9.7 06/03/2012 1008   CALCIUM 9.0 07/10/2011 1007   ALKPHOS 88 12/03/2012 1022   ALKPHOS 85 06/03/2012 1008   ALKPHOS 112* 07/10/2011 1007   AST 17 12/03/2012 1022   AST 16 06/03/2012 1008   AST 22 07/10/2011 1007   ALT 16 12/03/2012 1022   ALT 14 06/03/2012 1008   BILITOT 0.45 12/03/2012 1022   BILITOT 0.5 06/03/2012 1008   BILITOT 0.50 07/10/2011 1007      Impression and Plan:  Mr. [name] is a 79 year old man with:  1. Stage III colon cancer. He is S/P right hemicolectomy on 07/04/2010 for stage T3 N2 (5/14 lymph nodes positive) as well as S/P FOLFOX concluded in 01/2011. His last CT scan in 05/2012 did not show any relapse.  His labs and exam today suggest no relapse as well.  The plan is to continue active follow up with lab + exam every 6 months. CT scan every 12 months.  Next visit will be in 07/2013 with CT.   2. Port management: I discussed removal of the port and he is agreeable after the next scan. I will arrange for port flushes between visits.      [facility], MD 4/2/20149:43 AM

## 2013-03-25 ENCOUNTER — Ambulatory Visit (HOSPITAL_BASED_OUTPATIENT_CLINIC_OR_DEPARTMENT_OTHER): Payer: Medicare Other

## 2013-03-25 VITALS — BP 173/88 | HR 83

## 2013-03-25 DIAGNOSIS — Z452 Encounter for adjustment and management of vascular access device: Secondary | ICD-10-CM

## 2013-03-25 DIAGNOSIS — C180 Malignant neoplasm of cecum: Secondary | ICD-10-CM

## 2013-03-25 DIAGNOSIS — C182 Malignant neoplasm of ascending colon: Secondary | ICD-10-CM

## 2013-03-25 MED ORDER — HEPARIN SOD (PORK) LOCK FLUSH 100 UNIT/ML IV SOLN
500.0000 [IU] | Freq: Once | INTRAVENOUS | Status: AC
  Administered 2013-03-25: 500 [IU] via INTRAVENOUS
  Filled 2013-03-25: qty 5

## 2013-03-25 MED ORDER — SODIUM CHLORIDE 0.9 % IJ SOLN
10.0000 mL | INTRAMUSCULAR | Status: DC | PRN
  Administered 2013-03-25: 10 mL via INTRAVENOUS
  Filled 2013-03-25: qty 10

## 2013-03-31 ENCOUNTER — Ambulatory Visit (INDEPENDENT_AMBULATORY_CARE_PROVIDER_SITE_OTHER): Payer: Medicare Other | Admitting: General Surgery

## 2013-03-31 ENCOUNTER — Telehealth (INDEPENDENT_AMBULATORY_CARE_PROVIDER_SITE_OTHER): Payer: Self-pay

## 2013-03-31 NOTE — Telephone Encounter (Signed)
LMOV pt's appt rescheduled to 3:40 this afternoon.  Dr. Carolynne Edouard out of office this morning due to emergency at the hospital.

## 2013-03-31 NOTE — Telephone Encounter (Signed)
Spoke with pt - informing him that Dr. Carolynne Edouard will be unavailable this afternoon and that the pt should hear back from our office by Wednesday to reschedule the appt.  Pt said this was OK.

## 2013-04-14 ENCOUNTER — Encounter (INDEPENDENT_AMBULATORY_CARE_PROVIDER_SITE_OTHER): Payer: Self-pay | Admitting: General Surgery

## 2013-04-14 ENCOUNTER — Ambulatory Visit (INDEPENDENT_AMBULATORY_CARE_PROVIDER_SITE_OTHER): Payer: Medicare Other | Admitting: General Surgery

## 2013-04-14 VITALS — BP 110/64 | HR 60 | Resp 14 | Ht 69.0 in | Wt 185.4 lb

## 2013-04-14 DIAGNOSIS — C180 Malignant neoplasm of cecum: Secondary | ICD-10-CM

## 2013-04-14 NOTE — Progress Notes (Signed)
Subjective:     Patient ID: [name], male   DOB: [birth year], 79 y.o.   MRN: [id number]  HPI The patient is a 79 year old black male who is one year 9 month status post right colectomy for a T3 N2 colon cancer. Since his last visit he has been well and has no complaints. His appetite is good. His bowels are working normally. He denies any abdominal pain. He is maintaining his weight. He states that he has a colonoscopy scheduled for September.  Review of Systems  Constitutional: Negative.   HENT: Negative.   Eyes: Negative.   Respiratory: Negative.   Cardiovascular: Negative.   Gastrointestinal: Negative.   Endocrine: Negative.   Genitourinary: Negative.   Musculoskeletal: Negative.   Skin: Negative.   Allergic/Immunologic: Negative.   Neurological: Negative.   Hematological: Negative.   Psychiatric/Behavioral: Negative.        Objective:   Physical Exam  Constitutional: He is oriented to person, place, and time. He appears well-developed and well-nourished.  HENT:  Head: Normocephalic and atraumatic.  Eyes: Conjunctivae and EOM are normal. Pupils are equal, round, and reactive to light.  Neck: Normal range of motion. Neck supple.  Cardiovascular: Normal rate, regular rhythm and normal heart sounds.   Pulmonary/Chest: Effort normal and breath sounds normal.  There is no palpable groin or supraclavicular lymphadenopathy  Abdominal: Soft. Bowel sounds are normal. He exhibits no mass. There is no tenderness.  Musculoskeletal: Normal range of motion.  Lymphadenopathy:    He has no cervical adenopathy.  Neurological: He is alert and oriented to person, place, and time.  Skin: Skin is warm and dry.  Psychiatric: He has a normal mood and affect. His behavior is normal.       Assessment:     The patient is one year and 9 months status post right colectomy for a locally advanced right colon cancer. He has done very well.     Plan:     At this point if his colonoscopy looks  good then I think we can turn his followup over to his medical and oncology doctors. We will plan to see him back on a when necessary basis

## 2013-04-14 NOTE — Patient Instructions (Signed)
Continue to follow up with GI and oncology

## 2013-05-27 ENCOUNTER — Telehealth: Payer: Self-pay | Admitting: Oncology

## 2013-05-27 ENCOUNTER — Ambulatory Visit (HOSPITAL_BASED_OUTPATIENT_CLINIC_OR_DEPARTMENT_OTHER): Payer: Medicare Other

## 2013-05-27 VITALS — BP 168/80 | HR 63

## 2013-05-27 DIAGNOSIS — Z452 Encounter for adjustment and management of vascular access device: Secondary | ICD-10-CM

## 2013-05-27 DIAGNOSIS — C180 Malignant neoplasm of cecum: Secondary | ICD-10-CM

## 2013-05-27 MED ORDER — HEPARIN SOD (PORK) LOCK FLUSH 100 UNIT/ML IV SOLN
500.0000 [IU] | Freq: Once | INTRAVENOUS | Status: AC
  Administered 2013-05-27: 500 [IU] via INTRAVENOUS
  Filled 2013-05-27: qty 5

## 2013-05-27 MED ORDER — SODIUM CHLORIDE 0.9 % IJ SOLN
10.0000 mL | INTRAMUSCULAR | Status: DC | PRN
  Administered 2013-05-27: 10 mL via INTRAVENOUS
  Filled 2013-05-27: qty 10

## 2013-05-27 NOTE — Telephone Encounter (Signed)
Pt stopped by after flush to schedule lb due he is diabetic lbs are fasting and he ate this morning. Pt scheduled for 8/7 @ 8am and has appt d/t. Pt has script and will bring to the lb w/him. Kim in lab aware.

## 2013-05-27 NOTE — Patient Instructions (Addendum)
Implanted Port Instructions  An implanted port is a central line that has a round shape and is placed under the skin. It is used for long-term IV (intravenous) access for:  · Medicine.  · Fluids.  · Liquid nutrition, such as TPN (total parenteral nutrition).  · Blood samples.  Ports can be placed:  · In the chest area just below the collarbone (this is the most common place.)  · In the arms.  · In the belly (abdomen) area.  · In the legs.  PARTS OF THE PORT  A port has 2 main parts:  · The reservoir. The reservoir is round, disc-shaped, and will be a small, raised area under your skin.  · The reservoir is the part where a needle is inserted (accessed) to either give medicines or to draw blood.  · The catheter. The catheter is a long, slender tube that extends from the reservoir. The catheter is placed into a large vein.  · Medicine that is inserted into the reservoir goes into the catheter and then into the vein.  INSERTION OF THE PORT  · The port is surgically placed in either an operating room or in a procedural area (interventional radiology).  · Medicine may be given to help you relax during the procedure.  · The skin where the port will be inserted is numbed (local anesthetic).  · 1 or 2 small cuts (incisions) will be made in the skin to insert the port.  · The port can be used after it has been inserted.  INCISION SITE CARE  · The incision site may have small adhesive strips on it. This helps keep the incision site closed. Sometimes, no adhesive strips are placed. Instead of adhesive strips, a special kind of surgical glue is used to keep the incision closed.  · If adhesive strips were placed on the incision sites, do not take them off. They will fall off on their own.  · The incision site may be sore for 1 to 2 days. Pain medicine can help.  · Do not get the incision site wet. Bathe or shower as directed by your caregiver.  · The incision site should heal in 5 to 7 days. A small scar may form after the  incision has healed.  ACCESSING THE PORT  Special steps must be taken to access the port:  · Before the port is accessed, a numbing cream can be placed on the skin. This helps numb the skin over the port site.  · A sterile technique is used to access the port.  · The port is accessed with a needle. Only "non-coring" port needles should be used to access the port. Once the port is accessed, a blood return should be checked. This helps ensure the port is in the vein and is not clogged (clotted).  · If your caregiver believes your port should remain accessed, a clear (transparent) bandage will be placed over the needle site. The bandage and needle will need to be changed every week or as directed by your caregiver.  · Keep the bandage covering the needle clean and dry. Do not get it wet. Follow your caregiver's instructions on how to take a shower or bath when the port is accessed.  · If your port does not need to stay accessed, no bandage is needed over the port.  FLUSHING THE PORT  Flushing the port keeps it from getting clogged. How often the port is flushed depends on:  · If a   constant infusion is running. If a constant infusion is running, the port may not need to be flushed.  · If intermittent medicines are given.  · If the port is not being used.  For intermittent medicines:  · The port will need to be flushed:  · After medicines have been given.  · After blood has been drawn.  · As part of routine maintenance.  · A port is normally flushed with:  · Normal saline.  · Heparin.  · Follow your caregiver's advice on how often, how much, and the type of flush to use on your port.  IMPORTANT PORT INFORMATION  · Tell your caregiver if you are allergic to heparin.  · After your port is placed, you will get a manufacturer's information card. The card has information about your port. Keep this card with you at all times.  · There are many types of ports available. Know what kind of port you have.  · In case of an  emergency, it may be helpful to wear a medical alert bracelet. This can help alert health care workers that you have a port.  · The port can stay in for as long as your caregiver believes it is necessary.  · When it is time for the port to come out, surgery will be done to remove it. The surgery will be similar to how the port was put in.  · If you are in the hospital or clinic:  · Your port will be taken care of and flushed by a nurse.  · If you are at home:  · A home health care nurse may give medicines and take care of the port.  · You or a family member can get special training and directions for giving medicine and taking care of the port at home.  SEEK IMMEDIATE MEDICAL CARE IF:   · Your port does not flush or you are unable to get a blood return.  · New drainage or pus is coming from the incision.  · A bad smell is coming from the incision site.  · You develop swelling or increased redness at the incision site.  · You develop increased swelling or pain at the port site.  · You develop swelling or pain in the surrounding skin near the port.  · You have an oral temperature above 102° F (38.9° C), not controlled by medicine.  MAKE SURE YOU:   · Understand these instructions.  · Will watch your condition.  · Will get help right away if you are not doing well or get worse.  Document Released: 10/09/2005 Document Revised: 01/01/2012 Document Reviewed: 12/31/2008  [employer]® Patient Information ©2014 [employer].

## 2013-05-29 ENCOUNTER — Ambulatory Visit (HOSPITAL_BASED_OUTPATIENT_CLINIC_OR_DEPARTMENT_OTHER): Payer: Medicare Other | Admitting: Lab

## 2013-05-29 DIAGNOSIS — D649 Anemia, unspecified: Secondary | ICD-10-CM

## 2013-07-22 ENCOUNTER — Other Ambulatory Visit: Payer: Self-pay | Admitting: Oncology

## 2013-07-22 ENCOUNTER — Ambulatory Visit (HOSPITAL_BASED_OUTPATIENT_CLINIC_OR_DEPARTMENT_OTHER): Payer: Medicare Other

## 2013-07-22 ENCOUNTER — Ambulatory Visit (HOSPITAL_COMMUNITY)
Admission: RE | Admit: 2013-07-22 | Discharge: 2013-07-22 | Disposition: A | Discharge status: Home / Self Care | Payer: Medicare Other | Source: Ambulatory Visit | Attending: Oncology | Admitting: Oncology

## 2013-07-22 ENCOUNTER — Other Ambulatory Visit: Payer: Medicare Other | Admitting: Lab

## 2013-07-22 ENCOUNTER — Encounter (HOSPITAL_COMMUNITY): Payer: Self-pay

## 2013-07-22 VITALS — BP 151/77 | HR 61 | Temp 98.2°F | Resp 18

## 2013-07-22 DIAGNOSIS — C61 Malignant neoplasm of prostate: Secondary | ICD-10-CM | POA: Insufficient documentation

## 2013-07-22 DIAGNOSIS — Z452 Encounter for adjustment and management of vascular access device: Secondary | ICD-10-CM

## 2013-07-22 DIAGNOSIS — C801 Malignant (primary) neoplasm, unspecified: Secondary | ICD-10-CM | POA: Insufficient documentation

## 2013-07-22 DIAGNOSIS — C189 Malignant neoplasm of colon, unspecified: Secondary | ICD-10-CM | POA: Insufficient documentation

## 2013-07-22 DIAGNOSIS — C180 Malignant neoplasm of cecum: Secondary | ICD-10-CM

## 2013-07-22 DIAGNOSIS — N281 Cyst of kidney, acquired: Secondary | ICD-10-CM | POA: Insufficient documentation

## 2013-07-22 DIAGNOSIS — Z923 Personal history of irradiation: Secondary | ICD-10-CM | POA: Insufficient documentation

## 2013-07-22 DIAGNOSIS — Z9221 Personal history of antineoplastic chemotherapy: Secondary | ICD-10-CM | POA: Insufficient documentation

## 2013-07-22 DIAGNOSIS — C182 Malignant neoplasm of ascending colon: Secondary | ICD-10-CM

## 2013-07-22 LAB — CBC WITH DIFFERENTIAL/PLATELET
Basophils Absolute: 0 10*3/uL (ref 0.0–0.1)
EOS%: 2.8 % (ref 0.0–7.0)
HCT: 39.7 % (ref 38.4–49.9)
HGB: 13.5 g/dL (ref 13.0–17.1)
LYMPH%: 31.9 % (ref 14.0–49.0)
MCH: 33.8 pg — ABNORMAL HIGH (ref 27.2–33.4)
MCV: 99.2 fL — ABNORMAL HIGH (ref 79.3–98.0)
MONO%: 11.7 % (ref 0.0–14.0)
NEUT%: 52.5 % (ref 39.0–75.0)
Platelets: 185 10*3/uL (ref 140–400)
lymph#: 1.2 10*3/uL (ref 0.9–3.3)

## 2013-07-22 LAB — COMPREHENSIVE METABOLIC PANEL (CC13)
Albumin: 4 g/dL (ref 3.5–5.0)
Alkaline Phosphatase: 89 U/L (ref 40–150)
BUN: 11.9 mg/dL (ref 7.0–26.0)
Glucose: 62 mg/dl — ABNORMAL LOW (ref 70–140)
Potassium: 4.5 mEq/L (ref 3.5–5.1)

## 2013-07-22 MED ORDER — IOHEXOL 300 MG/ML  SOLN
100.0000 mL | Freq: Once | INTRAMUSCULAR | Status: AC | PRN
  Administered 2013-07-22: 100 mL via INTRAVENOUS

## 2013-07-22 MED ORDER — SODIUM CHLORIDE 0.9 % IJ SOLN
10.0000 mL | INTRAMUSCULAR | Status: DC | PRN
  Administered 2013-07-22: 10 mL via INTRAVENOUS
  Filled 2013-07-22: qty 10

## 2013-07-22 MED ORDER — HEPARIN SOD (PORK) LOCK FLUSH 100 UNIT/ML IV SOLN
500.0000 [IU] | Freq: Once | INTRAVENOUS | Status: AC
  Administered 2013-07-22: 500 [IU] via INTRAVENOUS
  Filled 2013-07-22: qty 5

## 2013-07-22 NOTE — Patient Instructions (Addendum)

## 2013-07-24 ENCOUNTER — Telehealth: Payer: Self-pay | Admitting: Oncology

## 2013-07-24 ENCOUNTER — Ambulatory Visit (HOSPITAL_BASED_OUTPATIENT_CLINIC_OR_DEPARTMENT_OTHER): Payer: Medicare Other | Admitting: Oncology

## 2013-07-24 VITALS — BP 128/64 | HR 67 | Temp 97.2°F | Resp 18 | Ht 69.0 in | Wt 187.7 lb

## 2013-07-24 DIAGNOSIS — C180 Malignant neoplasm of cecum: Secondary | ICD-10-CM

## 2013-07-24 NOTE — Telephone Encounter (Signed)
gv and printed appt sched and avs for pt for April 2015  °

## 2013-07-24 NOTE — Telephone Encounter (Signed)
added flush to pt appts gv and printed all appts .Marland KitchenMarland Kitchenpt ok adn aware

## 2013-07-24 NOTE — Progress Notes (Signed)
Hematology and Oncology Follow Up Visit  [name] [id number] January 11, 1931 79 y.o. 07/24/2013 10:49 AM [name], MDHarwani, Keith Osier, MD   Principle Diagnosis: 79 year old man with colon cancer diagnosed in 06/2010. He had stage III (T3N2 disease).  Prior Therapy:  1. He is status post laparoscopic-assisted right hemicolectomy on 07/04/2010 for stage T3 N2 (5/14 lymph nodes positive), moderate differentiated adenocarcinoma of the colon.  2. He is status post adjuvant FOLFOX6 from 08/24/2010 through 02/14/2011.    Current therapy: Observation and follow up. No evidence of relapse at this point.   Interim History: [name] presents today for a follow up visit. He is a nice man with stage III colon cancer under surveillance. Since his last visit he has been well without any GI complaints. He continues to enjoy good energy levels. He has had no change in his bowels. He denies  weight loss. He denies pain. He denies nausea or vomiting. Is not reporting any hematochezia or melena or any change in his bowel habits. He has not reported any musculoskeletal complaints. He has not reported any decline in his quality of life. He still have his Port-A-Cath and without any complications since the last visit. He elected to keep it for the time being.   Medications: I have reviewed the patient's current medications. Current Outpatient Prescriptions  Medication Sig Dispense Refill  . cloNIDine (CATAPRES) 0.2 MG tablet Take 0.2 mg by mouth daily.       Marland Kitchen Clopidogrel Bisulfate (PLAVIX PO) Take 75 mg by mouth every other day.       . ferrous sulfate 325 (65 FE) MG tablet Take 325 mg by mouth 2 (two) times daily.      Marland Kitchen glimepiride (AMARYL) 4 MG tablet Take 4 mg by mouth 2 (two) times daily.       . LESCOL XL 80 MG 24 hr tablet Take 80 mg by mouth.       . lidocaine-prilocaine (EMLA) cream Apply topically as needed.  30 g  0  . lisinopril (PRINIVIL,ZESTRIL) 10 MG tablet Take 20 mg by mouth 2 (two)  times daily.       . metFORMIN (GLUCOPHAGE) 500 MG tablet Take 500 mg by mouth 2 (two) times daily with a meal.       . ONE TOUCH ULTRA TEST test strip       . pioglitazone (ACTOS) 45 MG tablet Take 45 mg by mouth daily.       Marland Kitchen VALTREX 500 MG tablet Take 500 mg by mouth daily.        No current facility-administered medications for this visit.   Facility-Administered Medications Ordered in Other Visits  Medication Dose Route Frequency Provider Last Rate Last Dose  . sodium chloride 0.9 % injection 10 mL  10 mL Intravenous PRN Benjiman Core, MD   10 mL at 01/22/13 1043    Allergies:  Allergies  Allergen Reactions  . Penicillins Swelling    Happened in childhood. Did not affect breathing.    Past Medical History, Surgical history, Social history, and Family History were reviewed and updated.  Review of Systems:  Remaining ROS negative. Physical Exam: Blood pressure 128/64, pulse 67, temperature 97.2 F (36.2 C), temperature source Oral, resp. rate 18, height 5\' 9"  (1.753 m), weight 187 lb 11.2 oz (85.14 kg). ECOG: 0 General appearance: alert Head: Normocephalic, without obvious abnormality, atraumatic Neck: no adenopathy, no carotid bruit, no JVD, supple, symmetrical, trachea midline and thyroid not enlarged, symmetric, no tenderness/mass/nodules  Lymph nodes: Cervical, supraclavicular, and axillary nodes normal. Heart:regular rate and rhythm, S1, S2 normal, no murmur, click, rub or gallop Lung:chest clear, no wheezing, rales, normal symmetric air entry Abdomin: soft, non-tender, without masses or organomegaly EXT:no erythema, induration, or nodules   Lab Results: Lab Results  Component Value Date   WBC 3.6* 07/22/2013   HGB 13.5 07/22/2013   HCT 39.7 07/22/2013   MCV 99.2* 07/22/2013   PLT 185 07/22/2013     Chemistry      Component Value Date/Time   NA 142 07/22/2013 0957   NA 138 06/03/2012 1008   NA 137 07/10/2011 1007   K 4.5 07/22/2013 0957   K 4.4 06/03/2012 1008    K 5.0* 07/10/2011 1007   CL 103 12/03/2012 1022   CL 102 06/03/2012 1008   CL 96* 07/10/2011 1007   CO2 26 07/22/2013 0957   CO2 28 06/03/2012 1008   CO2 27 07/10/2011 1007   BUN 11.9 07/22/2013 0957   BUN 15 06/03/2012 1008   BUN 12 07/10/2011 1007   CREATININE 1.0 07/22/2013 0957   CREATININE 1.06 06/03/2012 1008   CREATININE 1.0 07/10/2011 1007      Component Value Date/Time   CALCIUM 10.1 07/22/2013 0957   CALCIUM 9.7 06/03/2012 1008   CALCIUM 9.0 07/10/2011 1007   ALKPHOS 89 07/22/2013 0957   ALKPHOS 85 06/03/2012 1008   ALKPHOS 112* 07/10/2011 1007   AST 20 07/22/2013 0957   AST 16 06/03/2012 1008   AST 22 07/10/2011 1007   ALT 16 07/22/2013 0957   ALT 14 06/03/2012 1008   ALT 31 07/10/2011 1007   BILITOT 0.55 07/22/2013 0957   BILITOT 0.5 06/03/2012 1008   BILITOT 0.50 07/10/2011 1007      Results for [name], [name] (MRN [id number]) as of 07/24/2013 10:51  Ref. Range 12/03/2012 10:22 07/22/2013 09:56  CEA Latest Range: 0.0-5.0 ng/mL 1.4 1.2     EXAM:  CT CHEST, ABDOMEN, AND PELVIS WITH CONTRAST  TECHNIQUE:  Multidetector CT imaging of the chest, abdomen and pelvis was  performed following the standard protocol during bolus  administration of intravenous contrast.  CONTRAST: OMNIPAQUE IOHEXOL 300 MG/ML SOLN  COMPARISON: 06/03/2012  FINDINGS:  CT CHEST FINDINGS  No evidence of hilar or mediastinal masses. No adenopathy seen  elsewhere within the thorax. No evidence of pleural or pericardial  effusion. Minimal calcified pleural plaque again noted along the  right hemidiaphragm is unchanged.  No suspicious pulmonary nodules or masses are identified. No  evidence of pulmonary infiltrate or central endobronchial  obstruction. No evidence of chest wall mass or suspicious bone  lesions.  CT ABDOMEN AND PELVIS FINDINGS  The liver, gallbladder, spleen, pancreas, and adrenal glands are  normal in appearance.  Bilateral renal cysts are stable and there is no evidence of renal  masses or  hydronephrosis. No evidence of abdominal or pelvic  lymphadenopathy. No evidence of inflammatory process or abnormal  fluid collections. Mild diffuse bladder wall thickening is stable.  Stable postoperative changes from previous partial right colectomy.  No suspicious bone lesions identified.  IMPRESSION:  CT CHEST IMPRESSION  Stable exam. No evidence of metastatic disease or other acute  findings.  CT ABDOMEN AND PELVIS IMPRESSION  Stable exam. No evidence of recurrent or metastatic carcinoma. No  other acute findings.  Impression and Plan:  [name] is a 79 year old man with:  1. Stage III colon cancer. He is S/P right hemicolectomy on 07/04/2010 for stage T3 N2 (5/14 lymph  nodes positive) as well as S/P FOLFOX concluded in 01/2011. His last CT scan in 05/2012 did not show any relapse.  His labs and exam today suggest no relapse as well. His CT scan done on 07/22/2013 was discussed today and did not show any evidence of relapsed disease. The plan is to continue active follow up with lab + exam every 6 months. CT scan every 12 months.  Next visit will be in 01/2014.  2. Port management: I discussed removal of the port and he elected to keep it for the time being and we'll continue Port-A-Cath flush between visits.      [name],FIRAS, MD 10/2/201410:49 AM

## 2013-09-02 ENCOUNTER — Encounter (INDEPENDENT_AMBULATORY_CARE_PROVIDER_SITE_OTHER): Payer: Self-pay

## 2013-09-02 ENCOUNTER — Ambulatory Visit (HOSPITAL_BASED_OUTPATIENT_CLINIC_OR_DEPARTMENT_OTHER): Payer: Medicare Other

## 2013-09-02 VITALS — BP 129/57 | HR 56 | Temp 96.8°F | Resp 16

## 2013-09-02 DIAGNOSIS — Z452 Encounter for adjustment and management of vascular access device: Secondary | ICD-10-CM

## 2013-09-02 DIAGNOSIS — C180 Malignant neoplasm of cecum: Secondary | ICD-10-CM

## 2013-09-02 DIAGNOSIS — Z95828 Presence of other vascular implants and grafts: Secondary | ICD-10-CM

## 2013-09-02 MED ORDER — HEPARIN SOD (PORK) LOCK FLUSH 100 UNIT/ML IV SOLN
500.0000 [IU] | Freq: Once | INTRAVENOUS | Status: AC
  Administered 2013-09-02: 500 [IU] via INTRAVENOUS
  Filled 2013-09-02: qty 5

## 2013-09-02 MED ORDER — SODIUM CHLORIDE 0.9 % IJ SOLN
10.0000 mL | INTRAMUSCULAR | Status: DC | PRN
  Administered 2013-09-02: 10 mL via INTRAVENOUS
  Filled 2013-09-02: qty 10

## 2013-09-02 NOTE — Patient Instructions (Signed)
Implanted Port Instructions  An implanted port is a central line that has a round shape and is placed under the skin. It is used for long-term IV (intravenous) access for:  · Medicine.  · Fluids.  · Liquid nutrition, such as TPN (total parenteral nutrition).  · Blood samples.  Ports can be placed:  · In the chest area just below the collarbone (this is the most common place.)  · In the arms.  · In the belly (abdomen) area.  · In the legs.  PARTS OF THE PORT  A port has 2 main parts:  · The reservoir. The reservoir is round, disc-shaped, and will be a small, raised area under your skin.  · The reservoir is the part where a needle is inserted (accessed) to either give medicines or to draw blood.  · The catheter. The catheter is a long, slender tube that extends from the reservoir. The catheter is placed into a large vein.  · Medicine that is inserted into the reservoir goes into the catheter and then into the vein.  INSERTION OF THE PORT  · The port is surgically placed in either an operating room or in a procedural area (interventional radiology).  · Medicine may be given to help you relax during the procedure.  · The skin where the port will be inserted is numbed (local anesthetic).  · 1 or 2 small cuts (incisions) will be made in the skin to insert the port.  · The port can be used after it has been inserted.  INCISION SITE CARE  · The incision site may have small adhesive strips on it. This helps keep the incision site closed. Sometimes, no adhesive strips are placed. Instead of adhesive strips, a special kind of surgical glue is used to keep the incision closed.  · If adhesive strips were placed on the incision sites, do not take them off. They will fall off on their own.  · The incision site may be sore for 1 to 2 days. Pain medicine can help.  · Do not get the incision site wet. Bathe or shower as directed by your caregiver.  · The incision site should heal in 5 to 7 days. A small scar may form after the  incision has healed.  ACCESSING THE PORT  Special steps must be taken to access the port:  · Before the port is accessed, a numbing cream can be placed on the skin. This helps numb the skin over the port site.  · A sterile technique is used to access the port.  · The port is accessed with a needle. Only "non-coring" port needles should be used to access the port. Once the port is accessed, a blood return should be checked. This helps ensure the port is in the vein and is not clogged (clotted).  · If your caregiver believes your port should remain accessed, a clear (transparent) bandage will be placed over the needle site. The bandage and needle will need to be changed every week or as directed by your caregiver.  · Keep the bandage covering the needle clean and dry. Do not get it wet. Follow your caregiver's instructions on how to take a shower or bath when the port is accessed.  · If your port does not need to stay accessed, no bandage is needed over the port.  FLUSHING THE PORT  Flushing the port keeps it from getting clogged. How often the port is flushed depends on:  · If a   constant infusion is running. If a constant infusion is running, the port may not need to be flushed.  · If intermittent medicines are given.  · If the port is not being used.  For intermittent medicines:  · The port will need to be flushed:  · After medicines have been given.  · After blood has been drawn.  · As part of routine maintenance.  · A port is normally flushed with:  · Normal saline.  · Heparin.  · Follow your caregiver's advice on how often, how much, and the type of flush to use on your port.  IMPORTANT PORT INFORMATION  · Tell your caregiver if you are allergic to heparin.  · After your port is placed, you will get a manufacturer's information card. The card has information about your port. Keep this card with you at all times.  · There are many types of ports available. Know what kind of port you have.  · In case of an  emergency, it may be helpful to wear a medical alert bracelet. This can help alert health care workers that you have a port.  · The port can stay in for as long as your caregiver believes it is necessary.  · When it is time for the port to come out, surgery will be done to remove it. The surgery will be similar to how the port was put in.  · If you are in the hospital or clinic:  · Your port will be taken care of and flushed by a nurse.  · If you are at home:  · A home health care nurse may give medicines and take care of the port.  · You or a family member can get special training and directions for giving medicine and taking care of the port at home.  SEEK IMMEDIATE MEDICAL CARE IF:   · Your port does not flush or you are unable to get a blood return.  · New drainage or pus is coming from the incision.  · A bad smell is coming from the incision site.  · You develop swelling or increased redness at the incision site.  · You develop increased swelling or pain at the port site.  · You develop swelling or pain in the surrounding skin near the port.  · You have an oral temperature above 102° F (38.9° C), not controlled by medicine.  MAKE SURE YOU:   · Understand these instructions.  · Will watch your condition.  · Will get help right away if you are not doing well or get worse.  Document Released: 10/09/2005 Document Revised: 01/01/2012 Document Reviewed: 12/31/2008  [employer]® Patient Information ©2014 [employer].

## 2013-10-14 ENCOUNTER — Ambulatory Visit (HOSPITAL_BASED_OUTPATIENT_CLINIC_OR_DEPARTMENT_OTHER): Payer: Medicare Other

## 2013-10-14 VITALS — BP 109/61 | HR 88 | Temp 97.9°F | Resp 17

## 2013-10-14 DIAGNOSIS — Z452 Encounter for adjustment and management of vascular access device: Secondary | ICD-10-CM

## 2013-10-14 DIAGNOSIS — C180 Malignant neoplasm of cecum: Secondary | ICD-10-CM

## 2013-10-14 DIAGNOSIS — Z95828 Presence of other vascular implants and grafts: Secondary | ICD-10-CM

## 2013-10-14 MED ORDER — SODIUM CHLORIDE 0.9 % IJ SOLN
10.0000 mL | INTRAMUSCULAR | Status: DC | PRN
  Administered 2013-10-14: 10 mL via INTRAVENOUS
  Filled 2013-10-14: qty 10

## 2013-10-14 MED ORDER — HEPARIN SOD (PORK) LOCK FLUSH 100 UNIT/ML IV SOLN
500.0000 [IU] | Freq: Once | INTRAVENOUS | Status: AC
  Administered 2013-10-14: 500 [IU] via INTRAVENOUS
  Filled 2013-10-14: qty 5

## 2013-10-14 NOTE — Patient Instructions (Signed)
Implanted Port Instructions  An implanted port is a central line that has a round shape and is placed under the skin. It is used for long-term IV (intravenous) access for:  · Medicine.  · Fluids.  · Liquid nutrition, such as TPN (total parenteral nutrition).  · Blood samples.  Ports can be placed:  · In the chest area just below the collarbone (this is the most common place.)  · In the arms.  · In the belly (abdomen) area.  · In the legs.  PARTS OF THE PORT  A port has 2 main parts:  · The reservoir. The reservoir is round, disc-shaped, and will be a small, raised area under your skin.  · The reservoir is the part where a needle is inserted (accessed) to either give medicines or to draw blood.  · The catheter. The catheter is a long, slender tube that extends from the reservoir. The catheter is placed into a large vein.  · Medicine that is inserted into the reservoir goes into the catheter and then into the vein.  INSERTION OF THE PORT  · The port is surgically placed in either an operating room or in a procedural area (interventional radiology).  · Medicine may be given to help you relax during the procedure.  · The skin where the port will be inserted is numbed (local anesthetic).  · 1 or 2 small cuts (incisions) will be made in the skin to insert the port.  · The port can be used after it has been inserted.  INCISION SITE CARE  · The incision site may have small adhesive strips on it. This helps keep the incision site closed. Sometimes, no adhesive strips are placed. Instead of adhesive strips, a special kind of surgical glue is used to keep the incision closed.  · If adhesive strips were placed on the incision sites, do not take them off. They will fall off on their own.  · The incision site may be sore for 1 to 2 days. Pain medicine can help.  · Do not get the incision site wet. Bathe or shower as directed by your caregiver.  · The incision site should heal in 5 to 7 days. A small scar may form after the  incision has healed.  ACCESSING THE PORT  Special steps must be taken to access the port:  · Before the port is accessed, a numbing cream can be placed on the skin. This helps numb the skin over the port site.  · A sterile technique is used to access the port.  · The port is accessed with a needle. Only "non-coring" port needles should be used to access the port. Once the port is accessed, a blood return should be checked. This helps ensure the port is in the vein and is not clogged (clotted).  · If your caregiver believes your port should remain accessed, a clear (transparent) bandage will be placed over the needle site. The bandage and needle will need to be changed every week or as directed by your caregiver.  · Keep the bandage covering the needle clean and dry. Do not get it wet. Follow your caregiver's instructions on how to take a shower or bath when the port is accessed.  · If your port does not need to stay accessed, no bandage is needed over the port.  FLUSHING THE PORT  Flushing the port keeps it from getting clogged. How often the port is flushed depends on:  · If a   constant infusion is running. If a constant infusion is running, the port may not need to be flushed.  · If intermittent medicines are given.  · If the port is not being used.  For intermittent medicines:  · The port will need to be flushed:  · After medicines have been given.  · After blood has been drawn.  · As part of routine maintenance.  · A port is normally flushed with:  · Normal saline.  · Heparin.  · Follow your caregiver's advice on how often, how much, and the type of flush to use on your port.  IMPORTANT PORT INFORMATION  · Tell your caregiver if you are allergic to heparin.  · After your port is placed, you will get a manufacturer's information card. The card has information about your port. Keep this card with you at all times.  · There are many types of ports available. Know what kind of port you have.  · In case of an  emergency, it may be helpful to wear a medical alert bracelet. This can help alert health care workers that you have a port.  · The port can stay in for as long as your caregiver believes it is necessary.  · When it is time for the port to come out, surgery will be done to remove it. The surgery will be similar to how the port was put in.  · If you are in the hospital or clinic:  · Your port will be taken care of and flushed by a nurse.  · If you are at home:  · A home health care nurse may give medicines and take care of the port.  · You or a family member can get special training and directions for giving medicine and taking care of the port at home.  SEEK IMMEDIATE MEDICAL CARE IF:   · Your port does not flush or you are unable to get a blood return.  · New drainage or pus is coming from the incision.  · A bad smell is coming from the incision site.  · You develop swelling or increased redness at the incision site.  · You develop increased swelling or pain at the port site.  · You develop swelling or pain in the surrounding skin near the port.  · You have an oral temperature above 102° F (38.9° C), not controlled by medicine.  MAKE SURE YOU:   · Understand these instructions.  · Will watch your condition.  · Will get help right away if you are not doing well or get worse.  Document Released: 10/09/2005 Document Revised: 01/01/2012 Document Reviewed: 12/31/2008  [employer]® Patient Information ©2014 [employer].

## 2013-11-25 ENCOUNTER — Ambulatory Visit (HOSPITAL_BASED_OUTPATIENT_CLINIC_OR_DEPARTMENT_OTHER): Payer: Medicare Other

## 2013-11-25 VITALS — BP 136/60 | HR 67

## 2013-11-25 DIAGNOSIS — C182 Malignant neoplasm of ascending colon: Secondary | ICD-10-CM

## 2013-11-25 DIAGNOSIS — Z452 Encounter for adjustment and management of vascular access device: Secondary | ICD-10-CM

## 2013-11-25 DIAGNOSIS — Z95828 Presence of other vascular implants and grafts: Secondary | ICD-10-CM

## 2013-11-25 MED ORDER — HEPARIN SOD (PORK) LOCK FLUSH 100 UNIT/ML IV SOLN
500.0000 [IU] | Freq: Once | INTRAVENOUS | Status: AC
Start: 2013-11-25 — End: 2013-11-25
  Administered 2013-11-25: 500 [IU] via INTRAVENOUS
  Filled 2013-11-25: qty 5

## 2013-11-25 MED ORDER — SODIUM CHLORIDE 0.9 % IJ SOLN
10.0000 mL | INTRAMUSCULAR | Status: DC | PRN
  Administered 2013-11-25: 10 mL via INTRAVENOUS
  Filled 2013-11-25: qty 10

## 2013-11-25 NOTE — Patient Instructions (Signed)

## 2014-01-06 ENCOUNTER — Ambulatory Visit (HOSPITAL_BASED_OUTPATIENT_CLINIC_OR_DEPARTMENT_OTHER): Payer: Medicare Other

## 2014-01-06 VITALS — BP 152/70 | HR 72

## 2014-01-06 DIAGNOSIS — Z452 Encounter for adjustment and management of vascular access device: Secondary | ICD-10-CM

## 2014-01-06 DIAGNOSIS — Z95828 Presence of other vascular implants and grafts: Secondary | ICD-10-CM

## 2014-01-06 DIAGNOSIS — C182 Malignant neoplasm of ascending colon: Secondary | ICD-10-CM

## 2014-01-06 MED ORDER — SODIUM CHLORIDE 0.9 % IJ SOLN
10.0000 mL | INTRAMUSCULAR | Status: DC | PRN
  Administered 2014-01-06: 10 mL via INTRAVENOUS
  Filled 2014-01-06: qty 10

## 2014-01-06 MED ORDER — HEPARIN SOD (PORK) LOCK FLUSH 100 UNIT/ML IV SOLN
500.0000 [IU] | Freq: Once | INTRAVENOUS | Status: AC
Start: 2014-01-06 — End: 2014-01-06
  Administered 2014-01-06: 500 [IU] via INTRAVENOUS
  Filled 2014-01-06: qty 5

## 2014-01-06 NOTE — Patient Instructions (Signed)

## 2014-01-22 ENCOUNTER — Encounter: Payer: Self-pay | Admitting: Oncology

## 2014-01-22 ENCOUNTER — Other Ambulatory Visit (HOSPITAL_BASED_OUTPATIENT_CLINIC_OR_DEPARTMENT_OTHER): Payer: Medicare Other

## 2014-01-22 ENCOUNTER — Telehealth: Payer: Self-pay | Admitting: Oncology

## 2014-01-22 ENCOUNTER — Ambulatory Visit (HOSPITAL_BASED_OUTPATIENT_CLINIC_OR_DEPARTMENT_OTHER): Payer: Medicare Other | Admitting: Oncology

## 2014-01-22 VITALS — BP 144/73 | HR 77 | Temp 98.2°F | Resp 20 | Ht 69.0 in | Wt 182.4 lb

## 2014-01-22 DIAGNOSIS — Z452 Encounter for adjustment and management of vascular access device: Secondary | ICD-10-CM

## 2014-01-22 DIAGNOSIS — C182 Malignant neoplasm of ascending colon: Secondary | ICD-10-CM

## 2014-01-22 DIAGNOSIS — C180 Malignant neoplasm of cecum: Secondary | ICD-10-CM

## 2014-01-22 LAB — COMPREHENSIVE METABOLIC PANEL (CC13)
ALBUMIN: 4 g/dL (ref 3.5–5.0)
ALT: 13 U/L (ref 0–55)
ANION GAP: 9 meq/L (ref 3–11)
AST: 17 U/L (ref 5–34)
Alkaline Phosphatase: 89 U/L (ref 40–150)
BUN: 13.6 mg/dL (ref 7.0–26.0)
CALCIUM: 9.5 mg/dL (ref 8.4–10.4)
CHLORIDE: 103 meq/L (ref 98–109)
CO2: 26 mEq/L (ref 22–29)
Creatinine: 1.3 mg/dL (ref 0.7–1.3)
GLUCOSE: 267 mg/dL — AB (ref 70–140)
Potassium: 4.5 mEq/L (ref 3.5–5.1)
SODIUM: 138 meq/L (ref 136–145)
TOTAL PROTEIN: 7.2 g/dL (ref 6.4–8.3)
Total Bilirubin: 0.51 mg/dL (ref 0.20–1.20)

## 2014-01-22 LAB — CBC WITH DIFFERENTIAL/PLATELET
BASO%: 0.5 % (ref 0.0–2.0)
Basophils Absolute: 0 10*3/uL (ref 0.0–0.1)
EOS ABS: 0.1 10*3/uL (ref 0.0–0.5)
EOS%: 1.8 % (ref 0.0–7.0)
HEMATOCRIT: 39.5 % (ref 38.4–49.9)
HGB: 13.1 g/dL (ref 13.0–17.1)
LYMPH%: 25.2 % (ref 14.0–49.0)
MCH: 33.2 pg (ref 27.2–33.4)
MCHC: 33.2 g/dL (ref 32.0–36.0)
MCV: 100.3 fL — ABNORMAL HIGH (ref 79.3–98.0)
MONO#: 0.3 10*3/uL (ref 0.1–0.9)
MONO%: 7.8 % (ref 0.0–14.0)
NEUT%: 64.7 % (ref 39.0–75.0)
NEUTROS ABS: 2.8 10*3/uL (ref 1.5–6.5)
PLATELETS: 176 10*3/uL (ref 140–400)
RBC: 3.94 10*6/uL — AB (ref 4.20–5.82)
RDW: 14.1 % (ref 11.0–14.6)
WBC: 4.4 10*3/uL (ref 4.0–10.3)
lymph#: 1.1 10*3/uL (ref 0.9–3.3)

## 2014-01-22 LAB — CEA: CEA: 0.9 ng/mL (ref 0.0–5.0)

## 2014-01-22 MED ORDER — SODIUM CHLORIDE 0.9 % IJ SOLN
10.0000 mL | INTRAMUSCULAR | Status: DC | PRN
  Administered 2014-01-22: 10 mL via INTRAVENOUS
  Filled 2014-01-22: qty 10

## 2014-01-22 MED ORDER — HEPARIN SOD (PORK) LOCK FLUSH 100 UNIT/ML IV SOLN
500.0000 [IU] | Freq: Once | INTRAVENOUS | Status: AC
  Administered 2014-01-22: 500 [IU] via INTRAVENOUS
  Filled 2014-01-22: qty 5

## 2014-01-22 NOTE — Progress Notes (Signed)
Hematology and Oncology Follow Up Visit  [name] [id number] [birth year] 80 y.o. 01/22/2014 10:00 AM [name], MDHarwani, Keith Lai, MD   Principle Diagnosis: 80 year old man with colon cancer diagnosed in 06/2010. He had stage III (T3N2 disease).  Prior Therapy:  1. He is status post laparoscopic-assisted right hemicolectomy on 07/04/2010 for stage T3 N2 (5/14 lymph nodes positive), moderate differentiated adenocarcinoma of the colon.  2. He is status post adjuvant FOLFOX6 from 08/24/2010 through 02/14/2011.    Current therapy: Observation and follow up. No evidence of relapse at this point.   Interim History: [name] presents today for a follow up visit. He is a nice man with stage III colon cancer under surveillance. Since his last visit he has been well without any GI complaints. He continues to enjoy good energy levels. He has had no change in his bowels. He denies weight loss. He denies pain. He denies nausea or vomiting. Is not reporting any hematochezia or melena or any change in his bowel habits. He has not reported any musculoskeletal complaints. He has not reported any decline in his quality of life. He still have his Port-A-Cath and without any complications since the last visit. He has not reported any hospitalization or illnesses. He continues to perform activity of daily living without any decline.   Medications: I have reviewed the patient's current medications. Current Outpatient Prescriptions  Medication Sig Dispense Refill  . cloNIDine (CATAPRES) 0.2 MG tablet Take 0.2 mg by mouth daily.       Marland Kitchen Clopidogrel Bisulfate (PLAVIX PO) Take 75 mg by mouth every other day.       . ferrous sulfate 325 (65 FE) MG tablet Take 325 mg by mouth 2 (two) times daily.      Marland Kitchen glimepiride (AMARYL) 4 MG tablet Take 4 mg by mouth 2 (two) times daily.       . LESCOL XL 80 MG 24 hr tablet Take 80 mg by mouth.       . lidocaine-prilocaine (EMLA) cream Apply topically as needed.  30 g  0   . lisinopril (PRINIVIL,ZESTRIL) 10 MG tablet Take 20 mg by mouth 2 (two) times daily.       . metFORMIN (GLUCOPHAGE) 500 MG tablet Take 500 mg by mouth 2 (two) times daily with a meal.       . ONE TOUCH ULTRA TEST test strip       . pioglitazone (ACTOS) 45 MG tablet Take 45 mg by mouth daily.       Marland Kitchen VALTREX 500 MG tablet Take 500 mg by mouth daily.        Current Facility-Administered Medications  Medication Dose Route Frequency Provider Last Rate Last Dose  . sodium chloride 0.9 % injection 10 mL  10 mL Intravenous PRN Keith Portela, MD   10 mL at 01/22/14 2355   Facility-Administered Medications Ordered in Other Visits  Medication Dose Route Frequency Provider Last Rate Last Dose  . sodium chloride 0.9 % injection 10 mL  10 mL Intravenous PRN Keith Portela, MD   10 mL at 01/22/13 1043    Allergies:  Allergies  Allergen Reactions  . Penicillins Swelling    Happened in childhood. Did not affect breathing.    Past Medical History, Surgical history, Social history, and Family History were reviewed and updated.  Review of Systems:  Remaining ROS negative. Physical Exam: Blood pressure 144/73, pulse 77, temperature 98.2 F (36.8 C), temperature source Oral, resp. rate 20, height 5'  9" (1.753 m), weight 182 lb 6.4 oz (82.736 kg). ECOG: 0 General appearance: alert Head: Normocephalic, without obvious abnormality, atraumatic Neck: no adenopathy, no carotid bruit, no JVD, supple, symmetrical, trachea midline and thyroid not enlarged, symmetric, no tenderness/mass/nodules Lymph nodes: Cervical, supraclavicular, and axillary nodes normal. Heart:regular rate and rhythm, S1, S2 normal, no murmur, click, rub or gallop Lung:chest clear, no wheezing, rales, normal symmetric air entry Abdomin: soft, non-tender, without masses or organomegaly EXT:no erythema, induration, or nodules   Lab Results: Lab Results  Component Value Date   WBC 4.4 01/22/2014   HGB 13.1 01/22/2014   HCT 39.5  01/22/2014   MCV 100.3* 01/22/2014   PLT 176 01/22/2014     Chemistry      Component Value Date/Time   NA 142 07/22/2013 0957   NA 138 06/03/2012 1008   NA 137 07/10/2011 1007   K 4.5 07/22/2013 0957   K 4.4 06/03/2012 1008   K 5.0* 07/10/2011 1007   CL 103 12/03/2012 1022   CL 102 06/03/2012 1008   CL 96* 07/10/2011 1007   CO2 26 07/22/2013 0957   CO2 28 06/03/2012 1008   CO2 27 07/10/2011 1007   BUN 11.9 07/22/2013 0957   BUN 15 06/03/2012 1008   BUN 12 07/10/2011 1007   CREATININE 1.0 07/22/2013 0957   CREATININE 1.06 06/03/2012 1008   CREATININE 1.0 07/10/2011 1007      Component Value Date/Time   CALCIUM 10.1 07/22/2013 0957   CALCIUM 9.7 06/03/2012 1008   CALCIUM 9.0 07/10/2011 1007   ALKPHOS 89 07/22/2013 0957   ALKPHOS 85 06/03/2012 1008   ALKPHOS 112* 07/10/2011 1007   AST 20 07/22/2013 0957   AST 16 06/03/2012 1008   AST 22 07/10/2011 1007   ALT 16 07/22/2013 0957   ALT 14 06/03/2012 1008   ALT 31 07/10/2011 1007   BILITOT 0.55 07/22/2013 0957   BILITOT 0.5 06/03/2012 1008   BILITOT 0.50 07/10/2011 1007      Impression and Plan:  [name] is a 80 year old man with:  1. Stage III colon cancer. He is S/P right hemicolectomy on 07/04/2010 for stage T3 N2 (5/14 lymph nodes positive) as well as S/P FOLFOX concluded in 01/2011.  His labs and exam today suggest no relapse as well. His CT scan done on 07/22/2013 did not show any evidence of relapsed disease. The plan is to continue active follow up with lab + exam and a repeat scan and 8 months. If he continues to be clear at that time he'll probably need annual visits with a CT scan to his 5 years out in may be discontinuation of surveillance after that.  2. Port management: I discussed removal of the port and he elected to keep it for the time being and we'll continue Port-A-Cath flush between visits.      [facility], MD 4/2/201510:00 AM

## 2014-01-22 NOTE — Telephone Encounter (Signed)
gv dn printed appt sched and avs for pt fro June Aug and  NOV....gv pt barium

## 2014-03-24 ENCOUNTER — Ambulatory Visit (HOSPITAL_BASED_OUTPATIENT_CLINIC_OR_DEPARTMENT_OTHER): Payer: Medicare Other

## 2014-03-24 VITALS — BP 144/60 | HR 76

## 2014-03-24 DIAGNOSIS — Z95828 Presence of other vascular implants and grafts: Secondary | ICD-10-CM

## 2014-03-24 DIAGNOSIS — C182 Malignant neoplasm of ascending colon: Secondary | ICD-10-CM

## 2014-03-24 DIAGNOSIS — Z452 Encounter for adjustment and management of vascular access device: Secondary | ICD-10-CM

## 2014-03-24 MED ORDER — SODIUM CHLORIDE 0.9 % IJ SOLN
10.0000 mL | INTRAMUSCULAR | Status: DC | PRN
  Administered 2014-03-24: 10 mL via INTRAVENOUS
  Filled 2014-03-24: qty 10

## 2014-03-24 MED ORDER — HEPARIN SOD (PORK) LOCK FLUSH 100 UNIT/ML IV SOLN
500.0000 [IU] | Freq: Once | INTRAVENOUS | Status: AC
  Administered 2014-03-24: 500 [IU] via INTRAVENOUS
  Filled 2014-03-24: qty 5

## 2014-05-26 ENCOUNTER — Ambulatory Visit (HOSPITAL_BASED_OUTPATIENT_CLINIC_OR_DEPARTMENT_OTHER): Payer: Medicare Other

## 2014-05-26 VITALS — BP 136/63 | HR 74 | Temp 97.7°F

## 2014-05-26 DIAGNOSIS — Z95828 Presence of other vascular implants and grafts: Secondary | ICD-10-CM

## 2014-05-26 DIAGNOSIS — C182 Malignant neoplasm of ascending colon: Secondary | ICD-10-CM

## 2014-05-26 DIAGNOSIS — Z452 Encounter for adjustment and management of vascular access device: Secondary | ICD-10-CM

## 2014-05-26 MED ORDER — HEPARIN SOD (PORK) LOCK FLUSH 100 UNIT/ML IV SOLN
500.0000 [IU] | Freq: Once | INTRAVENOUS | Status: AC
  Administered 2014-05-26: 500 [IU] via INTRAVENOUS
  Filled 2014-05-26: qty 5

## 2014-05-26 MED ORDER — SODIUM CHLORIDE 0.9 % IJ SOLN
10.0000 mL | INTRAMUSCULAR | Status: DC | PRN
  Administered 2014-05-26: 10 mL via INTRAVENOUS
  Filled 2014-05-26: qty 10

## 2014-05-26 NOTE — Patient Instructions (Signed)

## 2014-08-24 ENCOUNTER — Other Ambulatory Visit: Payer: Self-pay | Admitting: Nurse Practitioner

## 2014-08-25 ENCOUNTER — Ambulatory Visit (HOSPITAL_COMMUNITY)
Admission: RE | Admit: 2014-08-25 | Discharge: 2014-08-25 | Disposition: A | Discharge status: Home / Self Care | Payer: Medicare Other | Source: Ambulatory Visit | Attending: Diagnostic Radiology | Admitting: Diagnostic Radiology

## 2014-08-25 ENCOUNTER — Ambulatory Visit (HOSPITAL_BASED_OUTPATIENT_CLINIC_OR_DEPARTMENT_OTHER): Payer: Medicare Other

## 2014-08-25 ENCOUNTER — Encounter (HOSPITAL_COMMUNITY): Payer: Self-pay

## 2014-08-25 ENCOUNTER — Other Ambulatory Visit (HOSPITAL_BASED_OUTPATIENT_CLINIC_OR_DEPARTMENT_OTHER): Payer: Medicare Other

## 2014-08-25 VITALS — BP 144/67 | HR 78 | Temp 97.8°F

## 2014-08-25 DIAGNOSIS — Z9221 Personal history of antineoplastic chemotherapy: Secondary | ICD-10-CM | POA: Insufficient documentation

## 2014-08-25 DIAGNOSIS — N281 Cyst of kidney, acquired: Secondary | ICD-10-CM | POA: Diagnosis not present

## 2014-08-25 DIAGNOSIS — C61 Malignant neoplasm of prostate: Secondary | ICD-10-CM | POA: Insufficient documentation

## 2014-08-25 DIAGNOSIS — C180 Malignant neoplasm of cecum: Secondary | ICD-10-CM

## 2014-08-25 DIAGNOSIS — M5136 Other intervertebral disc degeneration, lumbar region: Secondary | ICD-10-CM | POA: Insufficient documentation

## 2014-08-25 DIAGNOSIS — C182 Malignant neoplasm of ascending colon: Secondary | ICD-10-CM

## 2014-08-25 DIAGNOSIS — Z923 Personal history of irradiation: Secondary | ICD-10-CM | POA: Insufficient documentation

## 2014-08-25 DIAGNOSIS — Z452 Encounter for adjustment and management of vascular access device: Secondary | ICD-10-CM

## 2014-08-25 DIAGNOSIS — I2510 Atherosclerotic heart disease of native coronary artery without angina pectoris: Secondary | ICD-10-CM | POA: Insufficient documentation

## 2014-08-25 DIAGNOSIS — I7090 Unspecified atherosclerosis: Secondary | ICD-10-CM | POA: Diagnosis not present

## 2014-08-25 DIAGNOSIS — Z95828 Presence of other vascular implants and grafts: Secondary | ICD-10-CM

## 2014-08-25 LAB — COMPREHENSIVE METABOLIC PANEL (CC13)
ALT: 21 U/L (ref 0–55)
ANION GAP: 8 meq/L (ref 3–11)
AST: 19 U/L (ref 5–34)
Albumin: 3.9 g/dL (ref 3.5–5.0)
Alkaline Phosphatase: 81 U/L (ref 40–150)
BUN: 14.7 mg/dL (ref 7.0–26.0)
CALCIUM: 9.4 mg/dL (ref 8.4–10.4)
CHLORIDE: 103 meq/L (ref 98–109)
CO2: 26 meq/L (ref 22–29)
CREATININE: 1.3 mg/dL (ref 0.7–1.3)
GLUCOSE: 282 mg/dL — AB (ref 70–140)
Potassium: 4.6 mEq/L (ref 3.5–5.1)
Sodium: 137 mEq/L (ref 136–145)
Total Bilirubin: 0.46 mg/dL (ref 0.20–1.20)
Total Protein: 6.7 g/dL (ref 6.4–8.3)

## 2014-08-25 LAB — CBC WITH DIFFERENTIAL/PLATELET
BASO%: 0.8 % (ref 0.0–2.0)
BASOS ABS: 0 10*3/uL (ref 0.0–0.1)
EOS%: 1.9 % (ref 0.0–7.0)
Eosinophils Absolute: 0.1 10*3/uL (ref 0.0–0.5)
HCT: 40.1 % (ref 38.4–49.9)
HGB: 13.1 g/dL (ref 13.0–17.1)
LYMPH%: 23.8 % (ref 14.0–49.0)
MCH: 31.5 pg (ref 27.2–33.4)
MCHC: 32.8 g/dL (ref 32.0–36.0)
MCV: 96.2 fL (ref 79.3–98.0)
MONO#: 0.3 10*3/uL (ref 0.1–0.9)
MONO%: 9.1 % (ref 0.0–14.0)
NEUT#: 2.3 10*3/uL (ref 1.5–6.5)
NEUT%: 64.4 % (ref 39.0–75.0)
PLATELETS: 180 10*3/uL (ref 140–400)
RBC: 4.17 10*6/uL — ABNORMAL LOW (ref 4.20–5.82)
RDW: 13.8 % (ref 11.0–14.6)
WBC: 3.6 10*3/uL — ABNORMAL LOW (ref 4.0–10.3)
lymph#: 0.9 10*3/uL (ref 0.9–3.3)

## 2014-08-25 MED ORDER — HEPARIN SOD (PORK) LOCK FLUSH 100 UNIT/ML IV SOLN
500.0000 [IU] | Freq: Once | INTRAVENOUS | Status: AC
  Administered 2014-08-25: 500 [IU] via INTRAVENOUS
  Filled 2014-08-25: qty 5

## 2014-08-25 MED ORDER — IOHEXOL 300 MG/ML  SOLN
100.0000 mL | Freq: Once | INTRAMUSCULAR | Status: AC | PRN
  Administered 2014-08-25: 100 mL via INTRAVENOUS

## 2014-08-25 MED ORDER — SODIUM CHLORIDE 0.9 % IJ SOLN
10.0000 mL | INTRAMUSCULAR | Status: DC | PRN
  Administered 2014-08-25: 10 mL via INTRAVENOUS
  Filled 2014-08-25: qty 10

## 2014-08-25 NOTE — Patient Instructions (Signed)

## 2014-08-26 LAB — CEA: CEA: 0.8 ng/mL (ref 0.0–5.0)

## 2014-08-27 ENCOUNTER — Ambulatory Visit (HOSPITAL_BASED_OUTPATIENT_CLINIC_OR_DEPARTMENT_OTHER): Payer: Medicare Other | Admitting: Oncology

## 2014-08-27 ENCOUNTER — Telehealth: Payer: Self-pay | Admitting: Oncology

## 2014-08-27 VITALS — BP 149/75 | HR 82 | Resp 19 | Ht 69.0 in | Wt 180.4 lb

## 2014-08-27 DIAGNOSIS — Z85038 Personal history of other malignant neoplasm of large intestine: Secondary | ICD-10-CM

## 2014-08-27 DIAGNOSIS — C180 Malignant neoplasm of cecum: Secondary | ICD-10-CM

## 2014-08-27 NOTE — Progress Notes (Signed)
Hematology and Oncology Follow Up Visit  [name] [id number] Jan 29, 1931 80 y.o. 08/27/2014 10:06 AM Clent Demark, MDHarwani, Allegra Lai, MD   Principle Diagnosis: 80 year old man with colon cancer diagnosed in 06/2010. He had stage III (T3N2 disease).  Prior Therapy:  1. He is status post laparoscopic-assisted right hemicolectomy on 07/04/2010 for stage T3 N2 (5/14 lymph nodes positive), moderate differentiated adenocarcinoma of the colon.  2. He is status post adjuvant FOLFOX6 from 08/24/2010 through 02/14/2011.    Current therapy: Observation and follow up. No evidence of relapse at this point.   Interim History: [name] presents today for a follow up visit. Since his last visit he has been well. He has not reported any GI complaints. He has not reported any recent illnesses or hospitalizations. He continues to ambulate without any difficulty.He continues to enjoy good energy levels. He has had no change in his bowels. He denies weight loss. He denies pain. He denies nausea or vomiting. Is not reporting any hematochezia or melena or any change in his bowel habits. He has not reported any musculoskeletal complaints. His appetite is excellent without any weight decline. He does not report any frequency urgency or hesitancy. He does not report any skeletal complaints. Rest of his review of systems unremarkable.   Medications: I have reviewed the patient's current medications. Current Outpatient Prescriptions  Medication Sig Dispense Refill  . cloNIDine (CATAPRES) 0.2 MG tablet Take 0.2 mg by mouth daily.     Marland Kitchen Clopidogrel Bisulfate (PLAVIX PO) Take 75 mg by mouth every other day.     . ferrous sulfate 325 (65 FE) MG tablet Take 325 mg by mouth 2 (two) times daily.    Marland Kitchen glimepiride (AMARYL) 4 MG tablet Take 4 mg by mouth 2 (two) times daily.     . LESCOL XL 80 MG 24 hr tablet Take 80 mg by mouth.     . lidocaine-prilocaine (EMLA) cream Apply topically as needed. 30 g 0  . lisinopril  (PRINIVIL,ZESTRIL) 10 MG tablet Take 20 mg by mouth 2 (two) times daily.     . metFORMIN (GLUCOPHAGE) 500 MG tablet Take 500 mg by mouth 2 (two) times daily with a meal.     . ONE TOUCH ULTRA TEST test strip     . pioglitazone (ACTOS) 45 MG tablet Take 45 mg by mouth daily.     Marland Kitchen VALTREX 500 MG tablet Take 500 mg by mouth daily.      No current facility-administered medications for this visit.   Facility-Administered Medications Ordered in Other Visits  Medication Dose Route Frequency Provider Last Rate Last Dose  . sodium chloride 0.9 % injection 10 mL  10 mL Intravenous PRN Wyatt Portela, MD   10 mL at 01/22/13 1043    Allergies:  Allergies  Allergen Reactions  . Penicillins Swelling    Happened in childhood. Did not affect breathing.    Past Medical History, Surgical history, Social history, and Family History were reviewed and updated.  Review of Systems:  Remaining ROS negative. Physical Exam: Blood pressure 149/75, pulse 82, temperature 0 F (-17.8 C), resp. rate 19, height 5\' 9"  (1.753 m), weight 180 lb 6.4 oz (81.829 kg), SpO2 100 %. ECOG: 0 General appearance: alert Head: Normocephalic, without obvious abnormality, atraumatic Neck: no adenopathy Lymph nodes: Cervical, supraclavicular, and axillary nodes normal. Heart:regular rate and rhythm, S1, S2 normal, no murmur, click, rub or gallop Lung:chest clear, no wheezing, rales, normal symmetric air entry Abdomin: soft, non-tender, without masses  or organomegaly EXT:no erythema, induration, or nodules   Lab Results: Lab Results  Component Value Date   WBC 3.6* 08/25/2014   HGB 13.1 08/25/2014   HCT 40.1 08/25/2014   MCV 96.2 08/25/2014   PLT 180 08/25/2014     Chemistry      Component Value Date/Time   NA 137 08/25/2014 1000   NA 138 06/03/2012 1008   NA 137 07/10/2011 1007   K 4.6 08/25/2014 1000   K 4.4 06/03/2012 1008   K 5.0* 07/10/2011 1007   CL 103 12/03/2012 1022   CL 102 06/03/2012 1008   CL  96* 07/10/2011 1007   CO2 26 08/25/2014 1000   CO2 28 06/03/2012 1008   CO2 27 07/10/2011 1007   BUN 14.7 08/25/2014 1000   BUN 15 06/03/2012 1008   BUN 12 07/10/2011 1007   CREATININE 1.3 08/25/2014 1000   CREATININE 1.06 06/03/2012 1008   CREATININE 1.0 07/10/2011 1007      Component Value Date/Time   CALCIUM 9.4 08/25/2014 1000   CALCIUM 9.7 06/03/2012 1008   CALCIUM 9.0 07/10/2011 1007   ALKPHOS 81 08/25/2014 1000   ALKPHOS 85 06/03/2012 1008   ALKPHOS 112* 07/10/2011 1007   AST 19 08/25/2014 1000   AST 16 06/03/2012 1008   AST 22 07/10/2011 1007   ALT 21 08/25/2014 1000   ALT 14 06/03/2012 1008   ALT 31 07/10/2011 1007   BILITOT 0.46 08/25/2014 1000   BILITOT 0.5 06/03/2012 1008   BILITOT 0.50 07/10/2011 1007      EXAM: CT CHEST, ABDOMEN, AND PELVIS WITH CONTRAST  TECHNIQUE: Multidetector CT imaging of the chest, abdomen and pelvis was performed following the standard protocol during bolus administration of intravenous contrast.  CONTRAST: 151mL OMNIPAQUE IOHEXOL 300 MG/ML SOLN  COMPARISON: 07/22/2013.  FINDINGS: CT CHEST FINDINGS  Mediastinum: The heart size is normal. There is no pericardial effusion. The trachea appears patent and is midline. Normal appearance of the esophagus. Calcified atherosclerotic plaque involves the thoracic aorta as well as the RCA, LAD and left circumflex coronary artery. No mediastinal or hilar adenopathy. There is no axillary or supraclavicular adenopathy. Bilateral gynecomastia noted.  Lungs/Pleura: There is no pleural effusion identified. Faint 6 mm nodule in the right middle lobe is unchanged, image 34/series 5. 3 mm nodule in the left lower lobe is unchanged, image 31/series 5.  Musculoskeletal: No aggressive lytic or sclerotic bone lesion identified.  CT ABDOMEN AND PELVIS FINDINGS  Hepatobiliary: No suspicious liver abnormality. The gallbladder appears normal. There is no biliary  dilatation.  Pancreas: Normal appearance of the pancreas.  Spleen: The spleen is unremarkable.  Adrenals/Urinary Tract: The adrenal glands both appear normal. Bilateral renal cysts are again noted. Cyst arising from the upper pole of the left kidney measures 9.4 cm. Several small tiny foci of calcification are identified within the wall of this cyst. Stable simple appearing right renal cysts. The urinary bladder appears normal.  Stomach/Bowel: The stomach is normal. The small bowel loops have a normal course and caliber. No evidence for bowel obstruction. The terminal ileum and cecum are normal. The colon is unremarkable.  Vascular/Lymphatic: Calcified atherosclerotic disease involves the abdominal aorta. There is no aneurysm. No retroperitoneal adenopathy identified. No mesenteric adenopathy. There is no pelvic or inguinal adenopathy.  Reproductive: Enlarged prostate gland with seed implants noted.  Other: There is no free fluid or fluid collections within the abdomen or pelvis. No peritoneal nodule or mass identified.  Musculoskeletal: Spondylosis is identified within the  lumbar spine. No aggressive lytic or sclerotic bone lesions identified.  IMPRESSION: 1. Stable CT of the chest abdomen and pelvis. No specific features identified to suggest metastatic disease or recurrence of tumor. 2. Bilateral renal cysts, unchanged from previous exam. 3. Atherosclerotic disease including multi vessel coronary artery calcification 4. Lumbar degenerative disc disease.   Impression and Plan:  [name] is a 80 year old man with:  1. Stage III colon cancer. He is S/P right hemicolectomy on 07/04/2010 for stage T3 N2 (5/14 lymph nodes positive) as well as S/P FOLFOX concluded in 01/2011.  His labs and exam today suggest no relapse as well. His CT scan done on 08/25/2014 was reviewed todaydid not show any evidence of relapsed disease. The plan is to continue with active  surveillance at the time being. I will repeat his laboratory testing and a physical examination in 6 months. He will require 1 last CT scan in 12 months. He will require no further follow-up beyond that.  2. Port management: I discussed removal of his Port-A-Cath. He is agreeable and I will refer him back to Dr. Marlou Starks for a Port-A-Cath removal.     Zola Button, MD 11/5/201510:06 AM

## 2014-08-27 NOTE — Telephone Encounter (Signed)
gv adn printed appt sched and avs for pt for May 2016 °

## 2014-09-11 ENCOUNTER — Telehealth: Payer: Self-pay | Admitting: Oncology

## 2014-09-11 NOTE — Telephone Encounter (Signed)
pt stopped by to get schedule and check on appt w/surgeon for port removal. pt given schedule for may 2016 and appt w/dr toth 12/8 @ 10:10am to arrive 10am. - s/w ccs today to confirm. pt aware doctor will talk with him re port removal at 12/8 visit.

## 2014-09-11 NOTE — Telephone Encounter (Signed)
Pt sched for 12.8 @ 10:10am...pt aware

## 2014-10-19 ENCOUNTER — Ambulatory Visit (HOSPITAL_BASED_OUTPATIENT_CLINIC_OR_DEPARTMENT_OTHER)
Admission: RE | Admit: 2014-10-19 | Discharge: 2014-10-19 | Disposition: A | Discharge status: Home / Self Care | Payer: Medicare Other | Source: Ambulatory Visit | Attending: General Surgery | Admitting: General Surgery

## 2014-10-19 ENCOUNTER — Encounter (HOSPITAL_BASED_OUTPATIENT_CLINIC_OR_DEPARTMENT_OTHER): Payer: Self-pay

## 2014-10-19 ENCOUNTER — Encounter (HOSPITAL_BASED_OUTPATIENT_CLINIC_OR_DEPARTMENT_OTHER)
Admission: RE | Disposition: A | Discharge status: Home / Self Care | Payer: Self-pay | Source: Ambulatory Visit | Attending: General Surgery

## 2014-10-19 DIAGNOSIS — I10 Essential (primary) hypertension: Secondary | ICD-10-CM | POA: Insufficient documentation

## 2014-10-19 DIAGNOSIS — Z87891 Personal history of nicotine dependence: Secondary | ICD-10-CM | POA: Diagnosis not present

## 2014-10-19 DIAGNOSIS — E119 Type 2 diabetes mellitus without complications: Secondary | ICD-10-CM | POA: Insufficient documentation

## 2014-10-19 DIAGNOSIS — C189 Malignant neoplasm of colon, unspecified: Secondary | ICD-10-CM | POA: Diagnosis not present

## 2014-10-19 DIAGNOSIS — Z79899 Other long term (current) drug therapy: Secondary | ICD-10-CM | POA: Insufficient documentation

## 2014-10-19 HISTORY — PX: PORT-A-CATH REMOVAL: SHX5289

## 2014-10-19 SURGERY — MINOR REMOVAL PORT-A-CATH
Anesthesia: LOCAL | Site: Chest

## 2014-10-19 MED ORDER — LIDOCAINE HCL (PF) 1 % IJ SOLN
INTRAMUSCULAR | Status: AC
  Filled 2014-10-19: qty 30

## 2014-10-19 MED ORDER — LIDOCAINE HCL (PF) 1 % IJ SOLN
INTRAMUSCULAR | Status: DC | PRN
  Administered 2014-10-19: 10 mL

## 2014-10-19 MED ORDER — HYDROCODONE-ACETAMINOPHEN 5-325 MG PO TABS: 1.0000 | ORAL_TABLET | Freq: Four times a day (QID) | ORAL | Status: DC | PRN

## 2014-10-19 SURGICAL SUPPLY — 19 items
BLADE SURG 15 STRL LF DISP TIS (BLADE) ×1 IMPLANT
BLADE SURG 15 STRL SS (BLADE) ×2
CHLORAPREP W/TINT 26ML (MISCELLANEOUS) ×2 IMPLANT
DECANTER SPIKE VIAL GLASS SM (MISCELLANEOUS) ×2 IMPLANT
DRAPE UTILITY XL STRL (DRAPES) ×2 IMPLANT
GLOVE BIO SURGEON STRL SZ 6.5 (GLOVE) ×2 IMPLANT
GLOVE BIO SURGEON STRL SZ7.5 (GLOVE) ×2 IMPLANT
GLOVE BIOGEL PI IND STRL 7.0 (GLOVE) ×2 IMPLANT
GLOVE BIOGEL PI INDICATOR 7.0 (GLOVE) ×2
GOWN STRL REUS W/ TWL LRG LVL3 (GOWN DISPOSABLE) ×2 IMPLANT
GOWN STRL REUS W/TWL LRG LVL3 (GOWN DISPOSABLE) ×4
LIQUID BAND (GAUZE/BANDAGES/DRESSINGS) ×2 IMPLANT
NDL HYPO 25X1 1.5 SAFETY (NEEDLE) ×1 IMPLANT
NEEDLE HYPO 25X1 1.5 SAFETY (NEEDLE) ×2 IMPLANT
SUT MON AB 4-0 PC3 18 (SUTURE) ×2 IMPLANT
SUT VIC AB 3-0 SH 27 (SUTURE) ×2
SUT VIC AB 3-0 SH 27X BRD (SUTURE) ×1 IMPLANT
SYR CONTROL 10ML LL (SYRINGE) ×2 IMPLANT
TOWEL OR NON WOVEN STRL DISP B (DISPOSABLE) ×2 IMPLANT

## 2014-10-19 NOTE — Interval H&P Note (Signed)
History and Physical Interval Note:  10/19/2014 9:54 AM  [name]  has presented today for surgery, with the diagnosis of Colon Cancer  The various methods of treatment have been discussed with the patient and family. After consideration of risks, benefits and other options for treatment, the patient has consented to  Procedure(s): MINOR REMOVAL PORT-A-CATH (N/A) as a surgical intervention .  The patient's history has been reviewed, patient examined, no change in status, stable for surgery.  I have reviewed the patient's chart and labs.  Questions were answered to the patient's satisfaction.     TOTH III,PAUL S

## 2014-10-19 NOTE — H&P (Signed)
[name] 09/29/2014 10:03 AM Location: [facility] Patient #: [id number] DOB: 06-22-1931 Divorced / Language: English / Race: Black or African American Male  History of Present Illness [name]. Keith Starks MD; 09/29/2014 10:26 AM) Patient words: discuss port removal.  The patient is a 80 year old male who presents for a follow-up for Abdominal pain. The patient is a 80 year old black male who is 3 years status post right colectomy for a T3 N2 colon cancer. He has done well and has no complaints today. He states that his follow-up colonoscopy last year was clean. Appetite is good and his bowels are working normally.   Other Problems [name], CMA; 09/29/2014 10:03 AM) Colon Cancer Diabetes Mellitus High blood pressure  Past Surgical History [name], [place]; 09/29/2014 10:03 AM) Oral Surgery  Diagnostic Studies History [name], [place]; 09/29/2014 10:03 AM) Colonoscopy 1-5 years ago  Allergies [name], CMA; 09/29/2014 10:04 AM) Penicillins  Medication History [name], CMA; 09/29/2014 10:07 AM) Glimepiride (4MG  Tablet, Oral) Active. MetFORMIN HCl (500MG  Tablet, Oral) Active. Lescol XL (80MG  Tablet ER 24HR, Oral) Active. Lisinopril (20MG  Tablet, Oral) Active. Pioglitazone HCl (45MG  Tablet, Oral) Active.  Social History [name], [place]; 09/29/2014 10:03 AM) No alcohol use No caffeine use No drug use Tobacco use Former smoker.  Family History [name], [place]; 09/29/2014 10:03 AM) Diabetes Mellitus Mother. Hypertension Father.  Review of Systems [name] CMA; 09/29/2014 10:03 AM) General Not Present- Appetite Loss, Chills, Fatigue, Fever, Night Sweats, Weight Gain and Weight Loss. Skin Not Present- Change in Wart/Mole, Dryness, Hives, Jaundice, New Lesions, Non-Healing Wounds, Rash and Ulcer. HEENT Not Present- Earache, Hearing Loss, Hoarseness, Nose Bleed, Oral Ulcers, Ringing in the Ears, Seasonal  Allergies, Sinus Pain, Sore Throat, Visual Disturbances, Wears glasses/contact lenses and Yellow Eyes. Respiratory Not Present- Bloody sputum, Chronic Cough, Difficulty Breathing, Snoring and Wheezing. Cardiovascular Not Present- Chest Pain, Difficulty Breathing Lying Down, Leg Cramps, Palpitations, Rapid Heart Rate, Shortness of Breath and Swelling of Extremities. Gastrointestinal Not Present- Abdominal Pain, Bloating, Bloody Stool, Change in Bowel Habits, Chronic diarrhea, Constipation, Difficulty Swallowing, Excessive gas, Gets full quickly at meals, Hemorrhoids, Indigestion, Nausea, Rectal Pain and Vomiting. Male Genitourinary Not Present- Blood in Urine, Change in Urinary Stream, Frequency, Impotence, Nocturia, Painful Urination, Urgency and Urine Leakage. Musculoskeletal Not Present- Back Pain, Joint Pain, Joint Stiffness, Muscle Pain, Muscle Weakness and Swelling of Extremities. Psychiatric Not Present- Anxiety, Bipolar, Change in Sleep Pattern, Depression, Fearful and Frequent crying. Endocrine Not Present- Cold Intolerance, Excessive Hunger, Hair Changes, Heat Intolerance, Hot flashes and New Diabetes. Hematology Not Present- Easy Bruising, Excessive bleeding, Gland problems, HIV and Persistent Infections.   Vitals [name] CMA; 09/29/2014 10:11 AM) 09/29/2014 10:08 AM Weight: 181 lb Height: 69in Body Surface Area: 2 m Body Mass Index: 26.73 kg/m Temp.: 98.45F  Pulse: 77 (Regular)  BP: 124/62 (Sitting, Left Arm, Standard)    Physical Exam [name] S. Keith Starks MD; 09/29/2014 10:27 AM) General Mental Status-Alert. General Appearance-Consistent with stated age. Hydration-Well hydrated. Voice-Normal.  Head and Neck Head-normocephalic, atraumatic with no lesions or palpable masses. Trachea-midline. Thyroid Gland Characteristics - normal size and consistency.  Eye Eyeball - Bilateral-Extraocular movements intact. Sclera/Conjunctiva - Bilateral-No  scleral icterus.  Chest and Lung Exam Chest and lung exam reveals -quiet, even and easy respiratory effort with no use of accessory muscles and on auscultation, normal breath sounds, no adventitious sounds and normal vocal resonance. Inspection Chest Wall - Normal. Back - normal. Note: The port is well positioned on his left  upper chest wall   Cardiovascular Cardiovascular examination reveals -normal heart sounds, regular rate and rhythm with no murmurs and normal pedal pulses bilaterally.  Abdomen Note: The abdomen is soft and nontender. The midline incision is healed nicely with no sign of infection or hernia. There is no palpable mass. There is no palpable groin or supraclavicular lymphadenopathy.   Neurologic Neurologic evaluation reveals -alert and oriented x 3 with no impairment of recent or remote memory. Mental Status-Normal.  Musculoskeletal Normal Exam - Left-Upper Extremity Strength Normal and Lower Extremity Strength Normal. Normal Exam - Right-Upper Extremity Strength Normal and Lower Extremity Strength Normal.  Lymphatic Head & Neck  General Head & Neck Lymphatics: Bilateral - Description - Normal. Axillary  General Axillary Region: Bilateral - Description - Normal. Tenderness - Non Tender. Femoral & Inguinal  Generalized Femoral & Inguinal Lymphatics: Bilateral - Description - Normal. Tenderness - Non Tender.    Assessment & Plan [name] S. Keith Starks MD; 09/29/2014 10:25 AM) COLON CANCER (153.9  C18.9) MALIGNANT NEOPLASM OF ASCENDING COLON (153.6  C18.2) Impression: The patient is 3 years status post right colectomy for colon cancer. He has done well. By his report his most recent colonoscopy was clean and there is no evidence of recurrence. He would like to have his port removed.I have discussed with them in detail the risks and benefits of the operation to remove the port as well as some of the technical aspects and he understands and wishes to  proceed     Signed by [name], MD (09/29/2014 10:28 AM)

## 2014-10-19 NOTE — Op Note (Signed)
10/19/2014  10:34 AM  PATIENT:  [name]  80 y.o. male  PRE-OPERATIVE DIAGNOSIS:  Colon Cancer  POST-OPERATIVE DIAGNOSIS:  Colon Cancer  PROCEDURE:  Procedure(s): MINOR REMOVAL PORT-A-CATH (N/A)  SURGEON:  Surgeon(s) and Role:    * Jovita Kussmaul, MD - Primary  PHYSICIAN ASSISTANT:   ASSISTANTS: none   ANESTHESIA:   local  EBL:     BLOOD ADMINISTERED:none  DRAINS: none   LOCAL MEDICATIONS USED:  LIDOCAINE   SPECIMEN:  No Specimen  DISPOSITION OF SPECIMEN:  N/A  COUNTS:  YES  TOURNIQUET:  * No tourniquets in log *  DICTATION: .Dragon Dictation  After informed consent was obtained the patient was brought to the operating room and placed in the supine position on the operating room table. The patient's left chest area was prepped with ChloraPrep, allowed to dry, and draped in usual sterile manner. The area around the port was infiltrated with 1% lidocaine until a good field block was created. A small incision was made with a 15 blade knife along his old incision. This incision was carried through the subcutaneous tissue sharply with tenotomy scissors until the port was identified. The capsule surrounding the port was opened sharply with a 15 blade knife. The 2 anchoring stitches were removed. The port was then gently pushed out of its capsule and with traction the port was removed without difficulty. Pressure was held for several minutes until the area was completely hemostatic. The deep layer of the wound was then closed with interrupted 3-0 Vicryl stitches. The skin was then closed with interrupted 4-0 Monocryl subcuticular stitches. Dermabond dressings were applied. The patient tolerated the procedure well. At the end of the case all needle sponge and instrument counts were correct. The patient was then taken to recovery in stable condition.  PLAN OF CARE: Discharge to home after PACU  PATIENT DISPOSITION:  PACU - hemodynamically stable.   Delay start of  Pharmacological VTE agent (>24hrs) due to surgical blood loss or risk of bleeding: not applicable

## 2014-10-20 ENCOUNTER — Encounter (HOSPITAL_BASED_OUTPATIENT_CLINIC_OR_DEPARTMENT_OTHER): Payer: Self-pay | Admitting: General Surgery

## 2015-02-25 ENCOUNTER — Ambulatory Visit (HOSPITAL_BASED_OUTPATIENT_CLINIC_OR_DEPARTMENT_OTHER): Payer: Medicare Other | Admitting: Oncology

## 2015-02-25 ENCOUNTER — Telehealth: Payer: Self-pay | Admitting: Oncology

## 2015-02-25 ENCOUNTER — Other Ambulatory Visit (HOSPITAL_BASED_OUTPATIENT_CLINIC_OR_DEPARTMENT_OTHER): Payer: Medicare Other

## 2015-02-25 VITALS — BP 142/65 | HR 76 | Temp 98.0°F | Resp 18 | Ht 69.0 in | Wt 171.5 lb

## 2015-02-25 DIAGNOSIS — Z85038 Personal history of other malignant neoplasm of large intestine: Secondary | ICD-10-CM

## 2015-02-25 DIAGNOSIS — C180 Malignant neoplasm of cecum: Secondary | ICD-10-CM

## 2015-02-25 LAB — CBC WITH DIFFERENTIAL/PLATELET
BASO%: 0.5 % (ref 0.0–2.0)
Basophils Absolute: 0 10*3/uL (ref 0.0–0.1)
EOS%: 2.9 % (ref 0.0–7.0)
Eosinophils Absolute: 0.1 10*3/uL (ref 0.0–0.5)
HEMATOCRIT: 40.9 % (ref 38.4–49.9)
HGB: 14.2 g/dL (ref 13.0–17.1)
LYMPH#: 1 10*3/uL (ref 0.9–3.3)
LYMPH%: 24.3 % (ref 14.0–49.0)
MCH: 32.4 pg (ref 27.2–33.4)
MCHC: 34.7 g/dL (ref 32.0–36.0)
MCV: 93.4 fL (ref 79.3–98.0)
MONO#: 0.4 10*3/uL (ref 0.1–0.9)
MONO%: 8.5 % (ref 0.0–14.0)
NEUT#: 2.6 10*3/uL (ref 1.5–6.5)
NEUT%: 63.8 % (ref 39.0–75.0)
Platelets: 198 10*3/uL (ref 140–400)
RBC: 4.38 10*6/uL (ref 4.20–5.82)
RDW: 12.9 % (ref 11.0–14.6)
WBC: 4.1 10*3/uL (ref 4.0–10.3)

## 2015-02-25 LAB — COMPREHENSIVE METABOLIC PANEL (CC13)
ALBUMIN: 3.9 g/dL (ref 3.5–5.0)
ALT: 24 U/L (ref 0–55)
AST: 19 U/L (ref 5–34)
Alkaline Phosphatase: 107 U/L (ref 40–150)
Anion Gap: 12 mEq/L — ABNORMAL HIGH (ref 3–11)
BILIRUBIN TOTAL: 0.41 mg/dL (ref 0.20–1.20)
BUN: 15.9 mg/dL (ref 7.0–26.0)
CO2: 26 meq/L (ref 22–29)
Calcium: 9.6 mg/dL (ref 8.4–10.4)
Chloride: 101 mEq/L (ref 98–109)
Creatinine: 1.3 mg/dL (ref 0.7–1.3)
EGFR: 57 mL/min/{1.73_m2} — ABNORMAL LOW (ref >90)
GLUCOSE: 343 mg/dL — AB (ref 70–140)
POTASSIUM: 5 meq/L (ref 3.5–5.1)
Sodium: 139 mEq/L (ref 136–145)
Total Protein: 6.9 g/dL (ref 6.4–8.3)

## 2015-02-25 NOTE — Progress Notes (Signed)
Hematology and Oncology Follow Up Visit  [name] [id number] 1931-03-11 81 y.o. 02/25/2015 10:25 AM Charolette Forward, MDHarwani, Prudencio Burly, MD   Principle Diagnosis: 81 year old man with colon cancer diagnosed in 06/2010. He had stage III (T3N2 disease).  Prior Therapy:  1. He is status post laparoscopic-assisted right hemicolectomy on 07/04/2010 for stage T3 N2 (5/14 lymph nodes positive), moderate differentiated adenocarcinoma of the colon.  2. He is status post adjuvant FOLFOX6 from 08/24/2010 through 02/14/2011.    Current therapy: Observation and follow up. No evidence of relapse at this point.   Interim History: [name] presents today for a follow up visit. Since his last visit, he reports no complaints. He had his Port-A-Cath removed as the last visit.  He has not reported any recent illnesses or hospitalizations. He continues to ambulate without any difficulty.He continues to enjoy good energy levels. He has had no change in his bowels. He denies weight loss. He denies pain. He denies nausea or vomiting. Is not reporting any hematochezia or melena or any change in his bowel habits. He has not reported any musculoskeletal complaints. He does not report any frequency urgency or hesitancy. He does not report any skeletal complaints. He has not reported any headaches or blurry vision or neurological complaints. Rest of his review of systems unremarkable.   Medications: I have reviewed the patient's current medications. Current Outpatient Prescriptions  Medication Sig Dispense Refill  . cloNIDine (CATAPRES) 0.2 MG tablet Take 0.2 mg by mouth daily.     Marland Kitchen Clopidogrel Bisulfate (PLAVIX PO) Take 75 mg by mouth every other day.     . ferrous sulfate 325 (65 FE) MG tablet Take 325 mg by mouth 2 (two) times daily.    Marland Kitchen glimepiride (AMARYL) 4 MG tablet Take 4 mg by mouth 2 (two) times daily.     Marland Kitchen HYDROcodone-acetaminophen (NORCO) 5-325 MG per tablet Take 1-2 tablets by mouth every 6 (six) hours  as needed. 30 tablet 0  . LESCOL XL 80 MG 24 hr tablet Take 80 mg by mouth.     . lidocaine-prilocaine (EMLA) cream Apply topically as needed. 30 g 0  . lisinopril (PRINIVIL,ZESTRIL) 10 MG tablet Take 20 mg by mouth 2 (two) times daily.     . metFORMIN (GLUCOPHAGE) 500 MG tablet Take 500 mg by mouth 2 (two) times daily with a meal.     . ONE TOUCH ULTRA TEST test strip     . pioglitazone (ACTOS) 45 MG tablet Take 45 mg by mouth daily.     Marland Kitchen VALTREX 500 MG tablet Take 500 mg by mouth daily.      No current facility-administered medications for this visit.   Facility-Administered Medications Ordered in Other Visits  Medication Dose Route Frequency Provider Last Rate Last Dose  . sodium chloride 0.9 % injection 10 mL  10 mL Intravenous PRN Wyatt Portela, MD   10 mL at 01/22/13 1043    Allergies:  Allergies  Allergen Reactions  . Penicillins Swelling    Happened in childhood. Did not affect breathing.    Past Medical History, Surgical history, Social history, and Family History were reviewed and updated.   Physical Exam: Blood pressure 142/65, pulse 76, temperature 98 F (36.7 C), resp. rate 18, height 5\' 9"  (1.753 m), weight 171 lb 8 oz (77.792 kg), SpO2 100 %. ECOG: 0 General appearance: alert awake not in any distress. Head: Normocephalic, without obvious abnormality Neck: no adenopathy Lymph nodes: Cervical, supraclavicular, and axillary nodes normal. Heart:regular  rate and rhythm, S1, S2 normal, no murmur, click, rub or gallop Lung:chest clear, no wheezing, rales, normal symmetric air entry Abdomin: soft, non-tender, without masses or organomegaly EXT:no erythema, induration, or nodules   Lab Results: Lab Results  Component Value Date   WBC 4.1 02/25/2015   HGB 14.2 02/25/2015   HCT 40.9 02/25/2015   MCV 93.4 02/25/2015   PLT 198 02/25/2015     Chemistry      Component Value Date/Time   NA 137 08/25/2014 1000   NA 138 06/03/2012 1008   NA 137 07/10/2011 1007    K 4.6 08/25/2014 1000   K 4.4 06/03/2012 1008   K 5.0* 07/10/2011 1007   CL 103 12/03/2012 1022   CL 102 06/03/2012 1008   CL 96* 07/10/2011 1007   CO2 26 08/25/2014 1000   CO2 28 06/03/2012 1008   CO2 27 07/10/2011 1007   BUN 14.7 08/25/2014 1000   BUN 15 06/03/2012 1008   BUN 12 07/10/2011 1007   CREATININE 1.3 08/25/2014 1000   CREATININE 1.06 06/03/2012 1008   CREATININE 1.0 07/10/2011 1007      Component Value Date/Time   CALCIUM 9.4 08/25/2014 1000   CALCIUM 9.7 06/03/2012 1008   CALCIUM 9.0 07/10/2011 1007   ALKPHOS 81 08/25/2014 1000   ALKPHOS 85 06/03/2012 1008   ALKPHOS 112* 07/10/2011 1007   AST 19 08/25/2014 1000   AST 16 06/03/2012 1008   AST 22 07/10/2011 1007   ALT 21 08/25/2014 1000   ALT 14 06/03/2012 1008   ALT 31 07/10/2011 1007   BILITOT 0.46 08/25/2014 1000   BILITOT 0.5 06/03/2012 1008   BILITOT 0.50 07/10/2011 1007       Impression and Plan:  [name] is a 81 year old with:  1. Stage III colon cancer. He is S/P right hemicolectomy on 07/04/2010 for stage T3 N2 (5/14 lymph nodes positive) as well as S/P FOLFOX concluded in 01/2011.  His labs and exam today suggest no relapse as well. His CT scan done on 08/25/2014 did not show any evidence of relapsed disease.  The plan is to continue with active surveillance at the time being. I will repeat his laboratory testing, physical examination and CT scan in 6 months. If his studies are all within normal range, no further follow-up will be needed at that time.  2. Port management: Port has been removed at this time without any complications.     [facility], MD 5/5/201610:25 AM

## 2015-02-25 NOTE — Telephone Encounter (Signed)
Gave and printed appt sched adn avs for pt for NOV....gv pt barium

## 2015-02-26 LAB — CEA: CEA: <0.5 ng/mL (ref 0.0–5.0)

## 2015-08-31 ENCOUNTER — Ambulatory Visit (HOSPITAL_COMMUNITY)
Admission: RE | Admit: 2015-08-31 | Discharge: 2015-08-31 | Disposition: A | Discharge status: Home / Self Care | Payer: Medicare Other | Source: Ambulatory Visit | Attending: Oncology | Admitting: Oncology

## 2015-08-31 ENCOUNTER — Encounter (HOSPITAL_COMMUNITY): Payer: Self-pay

## 2015-08-31 ENCOUNTER — Other Ambulatory Visit (HOSPITAL_BASED_OUTPATIENT_CLINIC_OR_DEPARTMENT_OTHER): Payer: Medicare Other

## 2015-08-31 DIAGNOSIS — Z9049 Acquired absence of other specified parts of digestive tract: Secondary | ICD-10-CM | POA: Insufficient documentation

## 2015-08-31 DIAGNOSIS — Z85038 Personal history of other malignant neoplasm of large intestine: Secondary | ICD-10-CM | POA: Diagnosis present

## 2015-08-31 DIAGNOSIS — C180 Malignant neoplasm of cecum: Secondary | ICD-10-CM | POA: Insufficient documentation

## 2015-08-31 DIAGNOSIS — I2510 Atherosclerotic heart disease of native coronary artery without angina pectoris: Secondary | ICD-10-CM | POA: Diagnosis not present

## 2015-08-31 LAB — CBC WITH DIFFERENTIAL/PLATELET
BASO%: 0.5 % (ref 0.0–2.0)
Basophils Absolute: 0 10*3/uL (ref 0.0–0.1)
EOS ABS: 0.1 10*3/uL (ref 0.0–0.5)
EOS%: 1.2 % (ref 0.0–7.0)
HCT: 40.1 % (ref 38.4–49.9)
HGB: 13.6 g/dL (ref 13.0–17.1)
LYMPH%: 21.8 % (ref 14.0–49.0)
MCH: 31.7 pg (ref 27.2–33.4)
MCHC: 33.9 g/dL (ref 32.0–36.0)
MCV: 93.5 fL (ref 79.3–98.0)
MONO#: 0.3 10*3/uL (ref 0.1–0.9)
MONO%: 7.6 % (ref 0.0–14.0)
NEUT#: 3 10*3/uL (ref 1.5–6.5)
NEUT%: 68.9 % (ref 39.0–75.0)
Platelets: 228 10*3/uL (ref 140–400)
RBC: 4.29 10*6/uL (ref 4.20–5.82)
RDW: 13.2 % (ref 11.0–14.6)
WBC: 4.3 10*3/uL (ref 4.0–10.3)
lymph#: 0.9 10*3/uL (ref 0.9–3.3)

## 2015-08-31 LAB — COMPREHENSIVE METABOLIC PANEL (CC13)
ALBUMIN: 4.2 g/dL (ref 3.5–5.0)
ALK PHOS: 143 U/L (ref 40–150)
ALT: 57 U/L — ABNORMAL HIGH (ref 0–55)
AST: 30 U/L (ref 5–34)
Anion Gap: 9 mEq/L (ref 3–11)
BUN: 17.1 mg/dL (ref 7.0–26.0)
CO2: 25 meq/L (ref 22–29)
Calcium: 10.6 mg/dL — ABNORMAL HIGH (ref 8.4–10.4)
Chloride: 105 mEq/L (ref 98–109)
Creatinine: 1.2 mg/dL (ref 0.7–1.3)
EGFR: 63 mL/min/{1.73_m2} — AB (ref >90)
Glucose: 155 mg/dl — ABNORMAL HIGH (ref 70–140)
POTASSIUM: 4.8 meq/L (ref 3.5–5.1)
Sodium: 139 mEq/L (ref 136–145)
Total Bilirubin: 0.47 mg/dL (ref 0.20–1.20)
Total Protein: 7.4 g/dL (ref 6.4–8.3)

## 2015-08-31 LAB — CEA: CEA: 1 ng/mL (ref 0.0–5.0)

## 2015-08-31 MED ORDER — IOHEXOL 300 MG/ML  SOLN
100.0000 mL | Freq: Once | INTRAMUSCULAR | Status: AC | PRN
  Administered 2015-08-31: 100 mL via INTRAVENOUS

## 2015-09-02 ENCOUNTER — Ambulatory Visit (HOSPITAL_BASED_OUTPATIENT_CLINIC_OR_DEPARTMENT_OTHER): Payer: Medicare Other | Admitting: Oncology

## 2015-09-02 VITALS — BP 138/67 | HR 80 | Temp 98.2°F | Resp 16 | Wt 174.6 lb

## 2015-09-02 DIAGNOSIS — Z85038 Personal history of other malignant neoplasm of large intestine: Secondary | ICD-10-CM | POA: Diagnosis present

## 2015-09-02 DIAGNOSIS — C180 Malignant neoplasm of cecum: Secondary | ICD-10-CM

## 2015-09-02 NOTE — Progress Notes (Signed)
Hematology and Oncology Follow Up Visit  Mujtaba Dyck DO:[id number] 07/30/1931 81 y.o. 09/02/2015 8:21 AM Charolette Forward, MDHarwani, Prudencio Burly, MD   Principle Diagnosis: 81 year old man with colon cancer diagnosed in 06/2010. He had stage III (T3N2 disease).  Prior Therapy:  1. He is status post laparoscopic-assisted right hemicolectomy on 07/04/2010 for stage T3 N2 (5/14 lymph nodes positive), moderate differentiated adenocarcinoma of the colon.  2. He is status post adjuvant FOLFOX6 from 08/24/2010 through 02/14/2011.    Current therapy: Observation and follow up. No evidence of relapse at this point.   Interim History: Mr. Metzker presents today for a follow up visit. Since his last visit, he continues to do very well without any recent issues. He denied any GI issues such as abdominal pain, I will satiety or change in his bowel habits. Has not reported any hematochezia or melena. He continues to ambulate without any difficulty.He continues to enjoy good energy levels. His appetite remains reasonable and his quality of life is unchanged.    He does not report any headaches, blurry vision, syncope or seizures. Does not report any fevers or chills or sweats. Does not report any chest pain or palpitation. Does not report any cough or hemoptysis. He does not report any frequency urgency or hesitancy. He does not report any skeletal complaints. Rest of his review of systems unremarkable.   Medications: I have reviewed the patient's current medications. Current Outpatient Prescriptions  Medication Sig Dispense Refill  . atorvastatin (LIPITOR) 40 MG tablet As directed    . cloNIDine (CATAPRES) 0.2 MG tablet Take 0.2 mg by mouth daily.     Marland Kitchen Clopidogrel Bisulfate (PLAVIX PO) Take 75 mg by mouth every other day.     . ferrous sulfate 325 (65 FE) MG tablet Take 325 mg by mouth 2 (two) times daily.    Marland Kitchen glimepiride (AMARYL) 4 MG tablet Take 4 mg by mouth 2 (two) times daily.     Marland Kitchen  HYDROcodone-acetaminophen (NORCO) 5-325 MG per tablet Take 1-2 tablets by mouth every 6 (six) hours as needed. 30 tablet 0  . LESCOL XL 80 MG 24 hr tablet Take 80 mg by mouth.     . lidocaine-prilocaine (EMLA) cream Apply topically as needed. 30 g 0  . lisinopril (PRINIVIL,ZESTRIL) 10 MG tablet Take 20 mg by mouth 2 (two) times daily.     . metFORMIN (GLUCOPHAGE) 500 MG tablet Take 500 mg by mouth 2 (two) times daily with a meal.     . ONE TOUCH ULTRA TEST test strip     . pioglitazone (ACTOS) 45 MG tablet Take 45 mg by mouth daily.     Marland Kitchen VALTREX 500 MG tablet Take 500 mg by mouth daily.      No current facility-administered medications for this visit.   Facility-Administered Medications Ordered in Other Visits  Medication Dose Route Frequency Provider Last Rate Last Dose  . sodium chloride 0.9 % injection 10 mL  10 mL Intravenous PRN Wyatt Portela, MD   10 mL at 01/22/13 1043    Allergies:  Allergies  Allergen Reactions  . Penicillins Swelling    Happened in childhood. Did not affect breathing.    Past Medical History, Surgical history, Social history, and Family History were reviewed and updated.   Physical Exam: There were no vitals taken for this visit. ECOG: 0 General appearance: alert pleasant gentleman without distress. Head: Normocephalic, without obvious abnormality no oral ulcers or lesions. Neck: no adenopathy Lymph nodes: Cervical, supraclavicular, and axillary  nodes normal. Heart:regular rate and rhythm, S1, S2 normal, no murmur, click, rub or gallop Lung:chest clear, no wheezing, rales, normal symmetric air entry Abdomin: soft, non-tender, without masses or organomegaly EXT:no erythema, induration, or nodules   Lab Results: Lab Results  Component Value Date   WBC 4.3 08/31/2015   HGB 13.6 08/31/2015   HCT 40.1 08/31/2015   MCV 93.5 08/31/2015   PLT 228 08/31/2015     Chemistry      Component Value Date/Time   NA 139 08/31/2015 0931   NA 138  06/03/2012 1008   NA 137 07/10/2011 1007   K 4.8 08/31/2015 0931   K 4.4 06/03/2012 1008   K 5.0* 07/10/2011 1007   CL 103 12/03/2012 1022   CL 102 06/03/2012 1008   CL 96* 07/10/2011 1007   CO2 25 08/31/2015 0931   CO2 28 06/03/2012 1008   CO2 27 07/10/2011 1007   BUN 17.1 08/31/2015 0931   BUN 15 06/03/2012 1008   BUN 12 07/10/2011 1007   CREATININE 1.2 08/31/2015 0931   CREATININE 1.06 06/03/2012 1008   CREATININE 1.0 07/10/2011 1007      Component Value Date/Time   CALCIUM 10.6* 08/31/2015 0931   CALCIUM 9.7 06/03/2012 1008   CALCIUM 9.0 07/10/2011 1007   ALKPHOS 143 08/31/2015 0931   ALKPHOS 85 06/03/2012 1008   ALKPHOS 112* 07/10/2011 1007   AST 30 08/31/2015 0931   AST 16 06/03/2012 1008   AST 22 07/10/2011 1007   ALT 57* 08/31/2015 0931   ALT 14 06/03/2012 1008   ALT 31 07/10/2011 1007   BILITOT 0.47 08/31/2015 0931   BILITOT 0.5 06/03/2012 1008   BILITOT 0.50 07/10/2011 1007     CLINICAL DATA: 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 Prostate cancer. History of chemotherapy and radiation therapy.  EXAM: CT CHEST, ABDOMEN, AND PELVIS WITH CONTRAST  TECHNIQUE: Multidetector CT imaging of the chest, abdomen and pelvis was performed following the standard protocol during bolus administration of intravenous contrast.  CONTRAST: 163mL OMNIPAQUE IOHEXOL 300 MG/ML SOLN  COMPARISON: 08/25/2014 CT of the chest, abdomen and pelvis .  FINDINGS: CT CHEST FINDINGS  Mediastinum/Nodes: Normal heart size. No pericardial fluid/thickening. There is atherosclerosis of the thoracic aorta, the great vessels of the mediastinum and the coronary arteries, including calcified atherosclerotic plaque in the left anterior descending, left circumflex and right coronary arteries. Great vessels are normal in course and caliber. No central pulmonary emboli. Normal visualized thyroid. Normal esophagus. No pathologically enlarged axillary,  mediastinal or hilar lymph nodes.  Lungs/Pleura: No pneumothorax. No pleural effusion. Stable calcified pleural plaques in the anterior mid to upper left pleural space and in the basilar right pleural space. Right middle lobe 5 mm ground-glass pulmonary nodule (series 4/ image 34), stable since 03/21/11. Stable tiny calcified granuloma in the subpleural left upper lobe. No acute consolidative airspace disease, new significant pulmonary nodules or lung masses.  Musculoskeletal: No aggressive appearing focal osseous lesions. Mild degenerative changes in the thoracic spine. Stable moderate symmetric gynecomastia.  CT ABDOMEN PELVIS FINDINGS  Hepatobiliary: Normal liver with no liver mass. Normal gallbladder with no radiopaque cholelithiasis. No biliary ductal dilatation.  Pancreas: Normal, with no mass or duct dilation.  Spleen: Normal size. No mass.  Adrenals/Urinary Tract: Normal adrenals. There are several simple renal cysts in both kidneys, largest 3.3 cm in the medial upper right kidney. There is a minimally complex 8.6 cm renal cyst in the upper left kidney with focal mural calcification,  unchanged. There are a few subcentimeter hypodense renal lesions in the left kidney, too small to characterize, not appreciably change. Normal bladder.  Stomach/Bowel: Grossly normal stomach. Normal caliber small bowel with no small bowel wall thickening. Status post ileocecal resection with stable appearance of the widely patent neo ileocolic anastomosis in the right abdomen, with no wall thickening or mass at the neo ileocolic anastomosis. No large bowel wall thickening.  Vascular/Lymphatic: Atherosclerotic nonaneurysmal abdominal aorta. Patent portal, splenic, hepatic and renal veins. No pathologically enlarged lymph nodes in the abdomen or pelvis.  Reproductive: Stable mild prostatomegaly with stereotactic radiation therapy seeds in the prostate.  Other: No  pneumoperitoneum, ascites or focal fluid collection.  Musculoskeletal: No aggressive appearing focal osseous lesions. Moderate degenerative changes in the lumbar spine, most prominent at L4-5. Stable small fat containing right inguinal hernia.  IMPRESSION: 1. Stable postsurgical changes status post ileocecal resection, with no evidence of local tumor recurrence. 2. No evidence of metastatic disease in the chest, abdomen or pelvis. 3. Three-vessel coronary atherosclerosis.   Impression and Plan:  Mr. Kuehler is a 81 year old with:  1. Stage III colon cancer. He is S/P right hemicolectomy on 07/04/2010 for stage T3 N2 (5/14 lymph nodes positive) as well as S/P FOLFOX concluded in 01/2011.   Laboratory data and CT scan in November 2016 continue to show no evidence of disease. At this time he is over 5 years from his surgery and close to 5 years from the conclusion of his chemotherapy. At this time, I see no further oncology follow-up is needed.   2. Port management: Port has been removed at this time without any complications.  3. Follow-up: I'll be happy to see him in the future as needed or if there is any other issues arise.     [facility], MD 11/10/20168:21 AM

## 2017-06-14 ENCOUNTER — Inpatient Hospital Stay (HOSPITAL_COMMUNITY)
Admission: EM | Admit: 2017-06-14 | Discharge: 2017-06-18 | DRG: 100 | Disposition: A | Discharge status: Skilled Nursing Facility | Payer: Medicare Other | Attending: Internal Medicine | Admitting: Internal Medicine

## 2017-06-14 ENCOUNTER — Encounter (HOSPITAL_COMMUNITY): Payer: Self-pay

## 2017-06-14 ENCOUNTER — Emergency Department (HOSPITAL_COMMUNITY): Payer: Medicare Other

## 2017-06-14 DIAGNOSIS — E1165 Type 2 diabetes mellitus with hyperglycemia: Secondary | ICD-10-CM | POA: Diagnosis present

## 2017-06-14 DIAGNOSIS — Z85038 Personal history of other malignant neoplasm of large intestine: Secondary | ICD-10-CM

## 2017-06-14 DIAGNOSIS — E1122 Type 2 diabetes mellitus with diabetic chronic kidney disease: Secondary | ICD-10-CM

## 2017-06-14 DIAGNOSIS — E11649 Type 2 diabetes mellitus with hypoglycemia without coma: Secondary | ICD-10-CM | POA: Diagnosis present

## 2017-06-14 DIAGNOSIS — G9341 Metabolic encephalopathy: Secondary | ICD-10-CM | POA: Diagnosis not present

## 2017-06-14 DIAGNOSIS — Z7902 Long term (current) use of antithrombotics/antiplatelets: Secondary | ICD-10-CM

## 2017-06-14 DIAGNOSIS — Z923 Personal history of irradiation: Secondary | ICD-10-CM

## 2017-06-14 DIAGNOSIS — E1129 Type 2 diabetes mellitus with other diabetic kidney complication: Secondary | ICD-10-CM | POA: Diagnosis present

## 2017-06-14 DIAGNOSIS — Z833 Family history of diabetes mellitus: Secondary | ICD-10-CM

## 2017-06-14 DIAGNOSIS — Z79899 Other long term (current) drug therapy: Secondary | ICD-10-CM

## 2017-06-14 DIAGNOSIS — N183 Chronic kidney disease, stage 3 unspecified: Secondary | ICD-10-CM | POA: Diagnosis present

## 2017-06-14 DIAGNOSIS — I252 Old myocardial infarction: Secondary | ICD-10-CM

## 2017-06-14 DIAGNOSIS — R410 Disorientation, unspecified: Secondary | ICD-10-CM

## 2017-06-14 DIAGNOSIS — Z9221 Personal history of antineoplastic chemotherapy: Secondary | ICD-10-CM

## 2017-06-14 DIAGNOSIS — Z87891 Personal history of nicotine dependence: Secondary | ICD-10-CM

## 2017-06-14 DIAGNOSIS — C180 Malignant neoplasm of cecum: Secondary | ICD-10-CM | POA: Diagnosis not present

## 2017-06-14 DIAGNOSIS — E860 Dehydration: Secondary | ICD-10-CM | POA: Diagnosis present

## 2017-06-14 DIAGNOSIS — I10 Essential (primary) hypertension: Secondary | ICD-10-CM | POA: Diagnosis not present

## 2017-06-14 DIAGNOSIS — E785 Hyperlipidemia, unspecified: Secondary | ICD-10-CM | POA: Diagnosis present

## 2017-06-14 DIAGNOSIS — I639 Cerebral infarction, unspecified: Secondary | ICD-10-CM | POA: Diagnosis present

## 2017-06-14 DIAGNOSIS — F0390 Unspecified dementia without behavioral disturbance: Secondary | ICD-10-CM | POA: Diagnosis present

## 2017-06-14 DIAGNOSIS — Z8546 Personal history of malignant neoplasm of prostate: Secondary | ICD-10-CM

## 2017-06-14 DIAGNOSIS — Z880 Allergy status to penicillin: Secondary | ICD-10-CM

## 2017-06-14 DIAGNOSIS — G40209 Localization-related (focal) (partial) symptomatic epilepsy and epileptic syndromes with complex partial seizures, not intractable, without status epilepticus: Secondary | ICD-10-CM | POA: Diagnosis not present

## 2017-06-14 DIAGNOSIS — I129 Hypertensive chronic kidney disease with stage 1 through stage 4 chronic kidney disease, or unspecified chronic kidney disease: Secondary | ICD-10-CM | POA: Diagnosis present

## 2017-06-14 DIAGNOSIS — Z8673 Personal history of transient ischemic attack (TIA), and cerebral infarction without residual deficits: Secondary | ICD-10-CM

## 2017-06-14 DIAGNOSIS — Z7984 Long term (current) use of oral hypoglycemic drugs: Secondary | ICD-10-CM

## 2017-06-14 HISTORY — DX: Acute myocardial infarction, unspecified: I21.9

## 2017-06-14 HISTORY — DX: Metabolic encephalopathy: G93.41

## 2017-06-14 HISTORY — DX: Chronic kidney disease, stage 3 unspecified: N18.30

## 2017-06-14 LAB — URINALYSIS, ROUTINE W REFLEX MICROSCOPIC
BACTERIA UA: NONE SEEN
BILIRUBIN URINE: NEGATIVE
Glucose, UA: >=500 mg/dL — AB
HGB URINE DIPSTICK: NEGATIVE
Ketones, ur: 5 mg/dL — AB
LEUKOCYTES UA: NEGATIVE
NITRITE: NEGATIVE
Protein, ur: NEGATIVE mg/dL
SPECIFIC GRAVITY, URINE: 1.028 (ref 1.005–1.030)
Squamous Epithelial / LPF: NONE SEEN
pH: 5 (ref 5.0–8.0)

## 2017-06-14 LAB — CBC WITH DIFFERENTIAL/PLATELET
BASOS ABS: 0 10*3/uL (ref 0.0–0.1)
BASOS PCT: 0 %
Eosinophils Absolute: 0 10*3/uL (ref 0.0–0.7)
Eosinophils Relative: 1 %
HEMATOCRIT: 38.2 % — AB (ref 39.0–52.0)
HEMOGLOBIN: 13.2 g/dL (ref 13.0–17.0)
Lymphocytes Relative: 21 %
Lymphs Abs: 1 10*3/uL (ref 0.7–4.0)
MCH: 30.9 pg (ref 26.0–34.0)
MCHC: 34.6 g/dL (ref 30.0–36.0)
MCV: 89.5 fL (ref 78.0–100.0)
Monocytes Absolute: 0.5 10*3/uL (ref 0.1–1.0)
Monocytes Relative: 11 %
NEUTROS ABS: 3.2 10*3/uL (ref 1.7–7.7)
NEUTROS PCT: 67 %
Platelets: 216 10*3/uL (ref 150–400)
RBC: 4.27 MIL/uL (ref 4.22–5.81)
RDW: 12.9 % (ref 11.5–15.5)
WBC: 4.8 10*3/uL (ref 4.0–10.5)

## 2017-06-14 LAB — BLOOD GAS, VENOUS
Acid-Base Excess: 0.4 mmol/L (ref 0.0–2.0)
Bicarbonate: 26.6 mmol/L (ref 20.0–28.0)
O2 Saturation: 41.9 %
Patient temperature: 37
pCO2, Ven: 52.3 mmHg (ref 44.0–60.0)
pH, Ven: 7.328 (ref 7.250–7.430)

## 2017-06-14 LAB — COMPREHENSIVE METABOLIC PANEL
ALK PHOS: 113 U/L (ref 38–126)
ALT: 28 U/L (ref 17–63)
AST: 20 U/L (ref 15–41)
Albumin: 4.6 g/dL (ref 3.5–5.0)
Anion gap: 10 (ref 5–15)
BUN: 27 mg/dL — ABNORMAL HIGH (ref 6–20)
CALCIUM: 10 mg/dL (ref 8.9–10.3)
CO2: 24 mmol/L (ref 22–32)
Chloride: 100 mmol/L — ABNORMAL LOW (ref 101–111)
Creatinine, Ser: 1.32 mg/dL — ABNORMAL HIGH (ref 0.61–1.24)
GFR, EST AFRICAN AMERICAN: 55 mL/min — AB (ref >60)
GFR, EST NON AFRICAN AMERICAN: 48 mL/min — AB (ref >60)
Glucose, Bld: 461 mg/dL — ABNORMAL HIGH (ref 65–99)
Potassium: 5 mmol/L (ref 3.5–5.1)
Sodium: 134 mmol/L — ABNORMAL LOW (ref 135–145)
Total Bilirubin: 0.7 mg/dL (ref 0.3–1.2)
Total Protein: 7.9 g/dL (ref 6.5–8.1)

## 2017-06-14 LAB — I-STAT CHEM 8, ED
BUN: 28 mg/dL — ABNORMAL HIGH (ref 6–20)
CALCIUM ION: 1.18 mmol/L (ref 1.15–1.40)
Chloride: 99 mmol/L — ABNORMAL LOW (ref 101–111)
Creatinine, Ser: 1.2 mg/dL (ref 0.61–1.24)
Glucose, Bld: 460 mg/dL — ABNORMAL HIGH (ref 65–99)
HCT: 39 % (ref 39.0–52.0)
Hemoglobin: 13.3 g/dL (ref 13.0–17.0)
Potassium: 5 mmol/L (ref 3.5–5.1)
SODIUM: 135 mmol/L (ref 135–145)
TCO2: 24 mmol/L (ref 0–100)

## 2017-06-14 LAB — RAPID URINE DRUG SCREEN, HOSP PERFORMED
Amphetamines: NOT DETECTED
BARBITURATES: NOT DETECTED
Benzodiazepines: NOT DETECTED
COCAINE: NOT DETECTED
Opiates: NOT DETECTED
TETRAHYDROCANNABINOL: NOT DETECTED

## 2017-06-14 LAB — ETHANOL: Alcohol, Ethyl (B): <5 mg/dL (ref <5)

## 2017-06-14 LAB — AMMONIA: Ammonia: 9 umol/L (ref 9–35)

## 2017-06-14 LAB — GLUCOSE, CAPILLARY: GLUCOSE-CAPILLARY: 270 mg/dL — AB (ref 65–99)

## 2017-06-14 LAB — CBG MONITORING, ED: GLUCOSE-CAPILLARY: 475 mg/dL — AB (ref 65–99)

## 2017-06-14 MED ORDER — CLOPIDOGREL BISULFATE 75 MG PO TABS: 75.0000 mg | ORAL_TABLET | ORAL | Status: DC

## 2017-06-14 MED ORDER — CLONIDINE HCL 0.1 MG PO TABS: 0.2000 mg | ORAL_TABLET | Freq: Every day | ORAL | Status: DC

## 2017-06-14 MED ORDER — LIDOCAINE-PRILOCAINE 2.5-2.5 % EX CREA: TOPICAL_CREAM | CUTANEOUS | Status: DC | PRN

## 2017-06-14 MED ORDER — VALACYCLOVIR HCL 500 MG PO TABS: 500.0000 mg | ORAL_TABLET | Freq: Every day | ORAL | Status: DC

## 2017-06-14 MED ORDER — INSULIN ASPART 100 UNIT/ML ~~LOC~~ SOLN: 10.0000 [IU] | Freq: Once | SUBCUTANEOUS | Status: DC

## 2017-06-14 MED ORDER — HYDROCODONE-ACETAMINOPHEN 5-325 MG PO TABS: 1.0000 | ORAL_TABLET | Freq: Four times a day (QID) | ORAL | Status: DC | PRN

## 2017-06-14 MED ORDER — SODIUM CHLORIDE 0.9 % IV SOLN
INTRAVENOUS | Status: DC
  Administered 2017-06-14 – 2017-06-15 (×3): via INTRAVENOUS

## 2017-06-14 MED ORDER — HYDRALAZINE HCL 20 MG/ML IJ SOLN
5.0000 mg | INTRAMUSCULAR | Status: DC | PRN
  Filled 2017-06-14: qty 0.25

## 2017-06-14 MED ORDER — LISINOPRIL 20 MG PO TABS: 20.0000 mg | ORAL_TABLET | Freq: Two times a day (BID) | ORAL | Status: DC

## 2017-06-14 MED ORDER — INSULIN ASPART 100 UNIT/ML ~~LOC~~ SOLN
8.0000 [IU] | Freq: Once | SUBCUTANEOUS | Status: AC
  Administered 2017-06-14: 8 [IU] via SUBCUTANEOUS
  Filled 2017-06-14: qty 1

## 2017-06-14 MED ORDER — ONDANSETRON HCL 4 MG/2ML IJ SOLN: 4.0000 mg | Freq: Three times a day (TID) | INTRAMUSCULAR | Status: DC | PRN

## 2017-06-14 MED ORDER — ENOXAPARIN SODIUM 40 MG/0.4ML ~~LOC~~ SOLN
40.0000 mg | Freq: Every day | SUBCUTANEOUS | Status: DC
  Administered 2017-06-14 – 2017-06-17 (×4): 40 mg via SUBCUTANEOUS
  Filled 2017-06-14 (×4): qty 0.4

## 2017-06-14 MED ORDER — FERROUS SULFATE 325 (65 FE) MG PO TABS
325.0000 mg | ORAL_TABLET | Freq: Two times a day (BID) | ORAL | Status: DC
  Administered 2017-06-14 – 2017-06-18 (×8): 325 mg via ORAL
  Filled 2017-06-14 (×8): qty 1

## 2017-06-14 MED ORDER — ATORVASTATIN CALCIUM 40 MG PO TABS
40.0000 mg | ORAL_TABLET | Freq: Every day | ORAL | Status: DC
  Administered 2017-06-15 – 2017-06-17 (×3): 40 mg via ORAL
  Filled 2017-06-14 (×3): qty 1

## 2017-06-14 MED ORDER — INSULIN ASPART 100 UNIT/ML ~~LOC~~ SOLN
0.0000 [IU] | Freq: Three times a day (TID) | SUBCUTANEOUS | Status: DC
  Administered 2017-06-15: 5 [IU] via SUBCUTANEOUS
  Administered 2017-06-15: 3 [IU] via SUBCUTANEOUS
  Administered 2017-06-16: 7 [IU] via SUBCUTANEOUS
  Administered 2017-06-16 (×2): 3 [IU] via SUBCUTANEOUS
  Administered 2017-06-17 (×2): 5 [IU] via SUBCUTANEOUS
  Administered 2017-06-18: 1 [IU] via SUBCUTANEOUS
  Administered 2017-06-18: 9 [IU] via SUBCUTANEOUS

## 2017-06-14 MED ORDER — ACETAMINOPHEN 325 MG PO TABS: 650.0000 mg | ORAL_TABLET | Freq: Four times a day (QID) | ORAL | Status: DC | PRN

## 2017-06-14 MED ORDER — SODIUM CHLORIDE 0.9 % IV SOLN: INTRAVENOUS | Status: DC

## 2017-06-14 MED ORDER — SODIUM CHLORIDE 0.9 % IV BOLUS (SEPSIS)
1000.0000 mL | Freq: Once | INTRAVENOUS | Status: AC
  Administered 2017-06-14: 1000 mL via INTRAVENOUS

## 2017-06-14 MED ORDER — INSULIN GLARGINE 100 UNIT/ML ~~LOC~~ SOLN
5.0000 [IU] | Freq: Every day | SUBCUTANEOUS | Status: DC
  Administered 2017-06-14: 5 [IU] via SUBCUTANEOUS
  Filled 2017-06-14: qty 0.05

## 2017-06-14 MED ORDER — SODIUM CHLORIDE 0.9 % IV BOLUS (SEPSIS)
2000.0000 mL | Freq: Once | INTRAVENOUS | Status: AC
  Administered 2017-06-14: 2000 mL via INTRAVENOUS

## 2017-06-14 MED ORDER — LISINOPRIL 20 MG PO TABS
20.0000 mg | ORAL_TABLET | Freq: Two times a day (BID) | ORAL | Status: DC
  Administered 2017-06-14 – 2017-06-18 (×8): 20 mg via ORAL
  Filled 2017-06-14 (×8): qty 1

## 2017-06-14 NOTE — ED Notes (Signed)
Bed: WA19 Expected date:  Expected time:  Means of arrival:  Comments: EMS-weakness 

## 2017-06-14 NOTE — ED Notes (Signed)
Attempted to call report. Will call back.  

## 2017-06-14 NOTE — H&P (Signed)
History and Physical    Issaih Gutierrez WLN:[id number] DOB: 01/02/1931 DOA: 06/14/2017  Referring MD/NP/PA:   PCP: Charolette Forward, MD   Patient coming from:  The patient is coming from home.  At baseline, pt is independent for most of ADL.  Chief Complaint: Altered mental status  HPI: [name] is a 83 y.o. male with medical history significant of hypertension, hyperlipidemia, diabetes mellitus, possible stroke, prostate cancer (s/p of surgery and possible chemo per his son), colon cancer (s/p of right colectomy, radiation and chemotherapy), who presents with altered mental status.  Per patient's son, patient's memory has been gradually declining in this year, but today his mental status has acutely worsened. Patient was found to be disorientated, urinating everywhere. Patient has generalized weakness, but no unilateral weakness, numbness or tingling to extremities. No facial droop, slurred speech, hearing loss, vision change per his son. Patient does not seem to have any pain anywhere. Does not seem to have chest pain, shortness breath, cough, nausea, vomiting, diarrhea, abdominal pain per his son. Patient moves all extremities normally.   ED course: pt was found to have blood sugar 461 with normal anion gap and bicarbonate 24, stable renal function, negative UDS, negative urinalysis, WBC 4.8, alcohol level less than 5, ammonia 9, temperature normal, no tachycardia, oxygen saturation 100% on room air, CT head is negative for acute intracranial abnormalities.  Review of Systems: Could not be reviewed accurately due to altered mental status.  Allergy:  Allergies  Allergen Reactions  . Penicillins Swelling    Has patient had a PCN reaction causing immediate rash, facial/tongue/throat swelling, SOB or lightheadedness with hypotension: Unknown Has patient had a PCN reaction causing severe rash involving mucus membranes or skin necrosis: Unknown Has patient had a PCN reaction that required  hospitalization: Unknown Has patient had a PCN reaction occurring within the last 10 years: Unknown If all of the above answers are "NO", then may proceed with Cephalosporin use.  Happened in childhood    Past Medical History:  Diagnosis Date  . Colon cancer ([place])    colon ca dx 7/11  . Diabetes mellitus   . Hypertension   . MI (myocardial infarction) ([place])   . Prostate CA [facility])    prostate  . Stroke [facility])     Past Surgical History:  Procedure Laterality Date  . COLON SURGERY  06/2010   right colectomy, T3N2  . PORT-A-CATH REMOVAL N/A 10/19/2014   Procedure: MINOR REMOVAL PORT-A-CATH;  Surgeon: Autumn Messing III, MD;  Location: [place];  Service: General;  Laterality: N/A;  . PROSTATE SURGERY      Social History:  reports that he has quit smoking. His smoking use included Cigarettes. He has a 0.75 pack-year smoking history. He has never used smokeless tobacco. He reports that he does not drink alcohol or use drugs.  Family History:  Family History  Problem Relation Age of Onset  . Diabetes Mellitus II Mother      Prior to Admission medications   Medication Sig Start Date End Date Taking? Authorizing Provider  atorvastatin (LIPITOR) 40 MG tablet As directed 08/29/15   [provider]  cloNIDine (CATAPRES) 0.2 MG tablet Take 0.2 mg by mouth daily.  07/03/11   [provider]  Clopidogrel Bisulfate (PLAVIX PO) Take 75 mg by mouth every other day.     [provider]  ferrous sulfate 325 (65 FE) MG tablet Take 325 mg by mouth 2 (two) times daily.    [provider]  glimepiride (AMARYL) 4 MG tablet Take 4 mg by mouth 2 (two) times daily.  08/30/11   [provider]  HYDROcodone-acetaminophen (NORCO) 5-325 MG per tablet Take 1-2 tablets by mouth every 6 (six) hours as needed. 10/19/14   Autumn Messing III, MD  LESCOL XL 80 MG 24 hr tablet Take 80 mg by mouth.  08/30/11   [provider]  lidocaine-prilocaine (EMLA)  cream Apply topically as needed. 10/09/12   Odogwu, Jerold Coombe I, MD  lisinopril (PRINIVIL,ZESTRIL) 10 MG tablet Take 20 mg by mouth 2 (two) times daily.  09/01/11   [provider]  metFORMIN (GLUCOPHAGE) 500 MG tablet Take 500 mg by mouth 2 (two) times daily with a meal.  09/13/11   [provider]  ONE TOUCH ULTRA TEST test strip  08/04/11   [provider]  pioglitazone (ACTOS) 45 MG tablet Take 45 mg by mouth daily.  08/30/11   [provider]  VALTREX 500 MG tablet Take 500 mg by mouth daily.  08/30/11   [provider]    Physical Exam: Vitals:   06/14/17 1712 06/14/17 1930 06/14/17 2000 06/14/17 2102  BP: 136/68 140/78 (!) 142/73   Pulse: 70 64 64   Resp: 16 16 18    Temp: 98.1 F (36.7 C)   98.2 F (36.8 C)  TempSrc: Oral     SpO2: 100% 100% 100%   Weight:      Height:       General: Not in acute distress HEENT:       Eyes: PERRL, EOMI, no scleral icterus.       ENT: No discharge from the ears and nose, no pharynx injection, no tonsillar enlargement.        Neck: No JVD, no bruit, no mass felt. Heme: No neck lymph node enlargement. Cardiac: S1/S2, RRR, No murmurs, No gallops or rubs. Respiratory: No rales, wheezing, rhonchi or rubs. GI: Soft, nondistended, nontender, no rebound pain, no organomegaly, BS present. GU: No hematuria Ext: No pitting leg edema bilaterally. 2+DP/PT pulse bilaterally. Musculoskeletal: No joint deformities, No joint redness or warmth, no limitation of ROM in spin. Skin: No rashes.  Neuro: confused, not oriented X3, but followed command. cranial nerves II-XII grossly intact. Muscle strength 5/5 in all extremities, sensation to light touch intact. Brachial reflex 2+ bilaterally. Negative Babinski's sign. Psych: Patient is not psychotic, no suicidal or hemocidal ideation.  Labs on Admission: I have personally reviewed following labs and imaging studies  CBC:  Recent Labs Lab 06/14/17 1809 06/14/17 1810    WBC  --  4.8  NEUTROABS  --  3.2  HGB 13.3 13.2  HCT 39.0 38.2*  MCV  --  89.5  PLT  --  702   Basic Metabolic Panel:  Recent Labs Lab 06/14/17 1809 06/14/17 1810  NA 135 134*  K 5.0 5.0  CL 99* 100*  CO2  --  24  GLUCOSE 460* 461*  BUN 28* 27*  CREATININE 1.20 1.32*  CALCIUM  --  10.0   GFR: Estimated Creatinine Clearance: 41.7 mL/min (A) (by C-G formula based on SCr of 1.32 mg/dL (H)). Liver Function Tests:  Recent Labs Lab 06/14/17 1810  AST 20  ALT 28  ALKPHOS 113  BILITOT 0.7  PROT 7.9  ALBUMIN 4.6   No results for input(s): LIPASE, AMYLASE in the last 168 hours.  Recent Labs Lab 06/14/17 1804  AMMONIA 9   Coagulation Profile: No results for input(s): INR, PROTIME in the last  168 hours. Cardiac Enzymes: No results for input(s): CKTOTAL, CKMB, CKMBINDEX, TROPONINI in the last 168 hours. BNP (last 3 results) No results for input(s): PROBNP in the last 8760 hours. HbA1C: No results for input(s): HGBA1C in the last 72 hours. CBG:  Recent Labs Lab 06/14/17 1716  GLUCAP 475*   Lipid Profile: No results for input(s): CHOL, HDL, LDLCALC, TRIG, CHOLHDL, LDLDIRECT in the last 72 hours. Thyroid Function Tests: No results for input(s): TSH, T4TOTAL, FREET4, T3FREE, THYROIDAB in the last 72 hours. Anemia Panel: No results for input(s): VITAMINB12, FOLATE, FERRITIN, TIBC, IRON, RETICCTPCT in the last 72 hours. Urine analysis:    Component Value Date/Time   COLORURINE YELLOW 06/14/2017 1737   APPEARANCEUR CLEAR 06/14/2017 1737   LABSPEC 1.028 06/14/2017 1737   PHURINE 5.0 06/14/2017 1737   GLUCOSEU >=500 (A) 06/14/2017 1737   HGBUR NEGATIVE 06/14/2017 1737   BILIRUBINUR NEGATIVE 06/14/2017 1737   KETONESUR 5 (A) 06/14/2017 1737   PROTEINUR NEGATIVE 06/14/2017 1737   UROBILINOGEN 0.2 06/04/2009 0302   NITRITE NEGATIVE 06/14/2017 1737   LEUKOCYTESUR NEGATIVE 06/14/2017 1737   Sepsis Labs: @LABRCNTIP (procalcitonin:4,lacticidven:4) )No results  found for this or any previous visit (from the past 240 hour(s)).   Radiological Exams on Admission: Ct Head Wo Contrast  Result Date: 06/14/2017 CLINICAL DATA:  83 y/o M; unexplained altered level of consciousness. EXAM: CT HEAD WITHOUT CONTRAST TECHNIQUE: Contiguous axial images were obtained from the base of the skull through the vertex without intravenous contrast. COMPARISON:  04/29/2017 CT and MRI head. FINDINGS: Brain: No evidence of acute infarction, hemorrhage, hydrocephalus, extra-axial collection or mass lesion/mass effect. Small chronic cortical infarct in the right frontal operculum. Stable mild chronic microvascular ischemic changes of the brain and moderate parenchymal volume loss with anterior frontal lobe predominance. Vascular: Extensive calcific atherosclerosis of carotid siphons. No hyperdense vessel identified peer Skull: Normal. Negative for fracture or focal lesion. Sinuses/Orbits: No acute finding. Other: None. IMPRESSION: 1. No acute intracranial abnormality identified. 2. Stable mild chronic microvascular ischemic changes and moderate brain parenchymal volume loss with anterior temporal lobe predominance. Electronically Signed   By: Kristine Garbe M.D.   On: 06/14/2017 18:50     EKG: Independently reviewed.  Sinus rhythm, QTC 377, anteroseptal infarction pattern.  Assessment/Plan Principal Problem:   Acute metabolic encephalopathy Active Problems:   Hypertension   Type II diabetes mellitus with renal manifestations (HCC)   Stroke (HCC)   CKD (chronic kidney disease), stage III   HLD (hyperlipidemia)   Acute metabolic encephalopathy: Etiology is not clear. Urinalysis negative, UDS negative, renal function stable and CT head is negative for acute intracranial abnormalities. Differential diagnoses include early stage of dementia (it should not cause acute altered mental status), delirium, hyperosmolar and hypo-glycemia condition (blood sugar 461, which should  not cause dramatic mental status change). Patient does not have focal neurological findings on physical examination, less likely to have large stroke.  -will place on tele bed for obs -Frequent neuro check -Treat hypoglycemia as below -If no improvement, may need to MRI of brain in morning (not ordered yet) -swallowing screen at bedside  Type II diabetes mellitus with renal manifestations and hypoglycemia: Last A1c 8.2, poorly controled. Patient is taking metformin and Amaryl at home -will give one dose of novolog 8 units now - start lantus 5 units daily -SSI -IVF: 3L NS, then 125 cc/h -Check A1c  HTN: -Continue lisinopril -IVF hydralazine when necessary  Hx of stroke ([place]):  -Continue Plavix and Lipitor  HLD: -Lipitor  CKD (chronic kidney disease), stage III: stable. Baseline creatinine 1.2-1.3. His creatinine is 1.32 on admission. -Follow-up renal function by BMP   DVT ppx: SQ Lovenox Code Status: Full code Family Communication:  Yes, patient's  son  at bed side Disposition Plan:  Anticipate discharge back to previous home environment Consults called: none  Admission status: Obs / tele   Date of Service 06/14/2017    Ivor Costa [facility] Pager [phone]  If 7PM-7AM, please contact night-coverage [email or web address] Password [facility] 06/14/2017, 9:09 PM

## 2017-06-14 NOTE — ED Provider Notes (Signed)
[facility] DEPT Provider Note   CSN: [id number] Arrival date & time: 06/14/17  1642     History   Chief Complaint Chief Complaint  Patient presents with  . Hyperglycemia  . Altered Mental Status  . Extremity Weakness    HPI [name] is a 83 y.o. male.  HPI  Remainder of history, ROS, and physical exam limited due to patient's condition (AMS). Additional information was obtained from EMS and family.   Level V Caveat.   Family reports that the patient has been having some memory and recall difficulty for approximately 1-2 years status post and gradually worsened over that time however today he was noted to be significantly altered and repeating questions. Patient also noted to have generalized weakness but no focal deficits, slurred speech or facial droop. He denies any recent fevers or infections. They report that the patient typically manages his own medication and treats his diabetes with pills.   Patient does have a history of colon cancer but has been in remission for several years.  Past Medical History:  Diagnosis Date  . Colon cancer ([place])    colon ca dx 7/11  . Diabetes mellitus   . Hypertension   . MI (myocardial infarction) ([place])   . Prostate CA [facility])    prostate  . Stroke [facility])     Patient Active Problem List   Diagnosis Date Noted  . Acute metabolic encephalopathy 62/37/6283  . CKD (chronic kidney disease), stage III 06/14/2017  . HLD (hyperlipidemia) 06/14/2017  . Stroke ([place])   . Hypertension 06/05/2012  . Type II diabetes mellitus with renal manifestations ([place]) 06/05/2012  . Malignant neoplasm of cecum ([place]) 08/27/2011    Past Surgical History:  Procedure Laterality Date  . COLON SURGERY  06/2010   right colectomy, T3N2  . PORT-A-CATH REMOVAL N/A 10/19/2014   Procedure: MINOR REMOVAL PORT-A-CATH;  Surgeon: Autumn Messing III, MD;  Location: [place];  Service: General;  Laterality: N/A;  . PROSTATE SURGERY          Home Medications    Prior to Admission medications   Medication Sig Start Date End Date Taking? Authorizing Provider  atorvastatin (LIPITOR) 40 MG tablet As directed 08/29/15  Yes [provider]  ferrous sulfate 325 (65 FE) MG tablet Take 325 mg by mouth 2 (two) times daily.   Yes [provider]  glimepiride (AMARYL) 4 MG tablet Take 4 mg by mouth 2 (two) times daily.  08/30/11  Yes [provider]  lisinopril (PRINIVIL,ZESTRIL) 20 MG tablet Take 20 mg by mouth 2 (two) times daily.   Yes [provider]  metFORMIN (GLUCOPHAGE) 500 MG tablet Take 500 mg by mouth 2 (two) times daily with a meal.  09/13/11  Yes [provider]  ONE TOUCH ULTRA TEST test strip  08/04/11  Yes [provider]  HYDROcodone-acetaminophen (NORCO) 5-325 MG per tablet Take 1-2 tablets by mouth every 6 (six) hours as needed. Patient not taking: Reported on 06/14/2017 10/19/14   Autumn Messing III, MD  lidocaine-prilocaine (EMLA) cream Apply topically as needed. Patient not taking: Reported on 06/14/2017 10/09/12   Nira Retort, MD    Family History Family History  Problem Relation Age of Onset  . Diabetes Mellitus II Mother     Social History Social History  Substance Use Topics  . Smoking status: Former Smoker    Packs/day: 0.25    Years: 3.00    Types: Cigarettes  . Smokeless tobacco: Never Used  .  Alcohol use No     Allergies   Penicillins   Review of Systems Review of Systems  Unable to perform ROS: Mental status change     Physical Exam Updated Vital Signs BP 140/78   Pulse 64   Temp 98.1 F (36.7 C) (Oral)   Resp 16   Ht 5\' 9"  (1.753 m)   Wt 72.6 kg (160 lb)   SpO2 100%   BMI 23.63 kg/m   Physical Exam  Constitutional: He appears well-developed and well-nourished. No distress.  HENT:  Head: Normocephalic and atraumatic.  Nose: Nose normal.  Eyes: Pupils are equal, round, and reactive to light. Conjunctivae and  EOM are normal. Right eye exhibits no discharge. Left eye exhibits no discharge. No scleral icterus.  Neck: Normal range of motion. Neck supple.  Cardiovascular: Normal rate and regular rhythm.  Exam reveals no gallop and no friction rub.   No murmur heard. Pulmonary/Chest: Effort normal and breath sounds normal. No stridor. No respiratory distress. He has no rales.  Abdominal: Soft. He exhibits no distension. There is no tenderness.  Musculoskeletal: He exhibits no edema or tenderness.  Neurological: He is alert. He is disoriented.  Skin: Skin is warm and dry. No rash noted. He is not diaphoretic. No erythema.  Psychiatric: He has a normal mood and affect.  Vitals reviewed.    ED Treatments / Results  Labs (all labs ordered are listed, but only abnormal results are displayed) Labs Reviewed  CBC WITH DIFFERENTIAL/PLATELET - Abnormal; Notable for the following:       Result Value   HCT 38.2 (*)    All other components within normal limits  URINALYSIS, ROUTINE W REFLEX MICROSCOPIC - Abnormal; Notable for the following:    Glucose, UA >=500 (*)    Ketones, ur 5 (*)    All other components within normal limits  COMPREHENSIVE METABOLIC PANEL - Abnormal; Notable for the following:    Sodium 134 (*)    Chloride 100 (*)    Glucose, Bld 461 (*)    BUN 27 (*)    Creatinine, Ser 1.32 (*)    GFR calc non Af Amer 48 (*)    GFR calc Af Amer 55 (*)    All other components within normal limits  GLUCOSE, CAPILLARY - Abnormal; Notable for the following:    Glucose-Capillary 270 (*)    All other components within normal limits  I-STAT CHEM 8, ED - Abnormal; Notable for the following:    Chloride 99 (*)    BUN 28 (*)    Glucose, Bld 460 (*)    All other components within normal limits  CBG MONITORING, ED - Abnormal; Notable for the following:    Glucose-Capillary 475 (*)    All other components within normal limits  BLOOD GAS, VENOUS  AMMONIA  ETHANOL  RAPID URINE DRUG SCREEN, HOSP  PERFORMED  BASIC METABOLIC PANEL  CBC  HEMOGLOBIN A1C    EKG  EKG Interpretation  Date/Time:  Thursday June 14 2017 17:21:20 EDT Ventricular Rate:  65 PR Interval:    QRS Duration: 70 QT Interval:  362 QTC Calculation: 377 R Axis:   59 Text Interpretation:  Sinus rhythm NO STEMI No significant change since last tracing Confirmed by Addison Lank [phone]) on 06/14/2017 5:26:54 PM       Radiology Ct Head Wo Contrast  Result Date: 06/14/2017 CLINICAL DATA:  83 y/o M; unexplained altered level of consciousness. EXAM: CT HEAD WITHOUT CONTRAST TECHNIQUE: Contiguous axial images were  obtained from the base of the skull through the vertex without intravenous contrast. COMPARISON:  04/29/2017 CT and MRI head. FINDINGS: Brain: No evidence of acute infarction, hemorrhage, hydrocephalus, extra-axial collection or mass lesion/mass effect. Small chronic cortical infarct in the right frontal operculum. Stable mild chronic microvascular ischemic changes of the brain and moderate parenchymal volume loss with anterior frontal lobe predominance. Vascular: Extensive calcific atherosclerosis of carotid siphons. No hyperdense vessel identified peer Skull: Normal. Negative for fracture or focal lesion. Sinuses/Orbits: No acute finding. Other: None. IMPRESSION: 1. No acute intracranial abnormality identified. 2. Stable mild chronic microvascular ischemic changes and moderate brain parenchymal volume loss with anterior temporal lobe predominance. Electronically Signed   By: Kristine Garbe M.D.   On: 06/14/2017 18:50    Procedures Procedures (including critical care time)  Medications Ordered in ED Medications  sodium chloride 0.9 % bolus 1,000 mL (0 mLs Intravenous Stopped 06/14/17 1931)    And  0.9 %  sodium chloride infusion ( Intravenous New Bag/Given 06/14/17 2259)  atorvastatin (LIPITOR) tablet 40 mg (not administered)  HYDROcodone-acetaminophen (NORCO/VICODIN) 5-325 MG per tablet 1-2 tablet  (not administered)  lidocaine-prilocaine (EMLA) cream (not administered)  ferrous sulfate tablet 325 mg (325 mg Oral Given 06/14/17 2259)  insulin glargine (LANTUS) injection 5 Units (5 Units Subcutaneous Given 06/14/17 2259)  insulin aspart (novoLOG) injection 0-9 Units (not administered)  hydrALAZINE (APRESOLINE) injection 5 mg (not administered)  ondansetron (ZOFRAN) injection 4 mg (not administered)  acetaminophen (TYLENOL) tablet 650 mg (not administered)  enoxaparin (LOVENOX) injection 40 mg (40 mg Subcutaneous Given 06/14/17 2259)  lisinopril (PRINIVIL,ZESTRIL) tablet 20 mg (20 mg Oral Given 06/14/17 2259)  sodium chloride 0.9 % bolus 2,000 mL (0 mLs Intravenous Stopped 06/14/17 2335)  insulin aspart (novoLOG) injection 8 Units (8 Units Subcutaneous Given 06/14/17 2134)     Initial Impression / Assessment and Plan / ED Course  I have reviewed the triage vital signs and the nursing notes.  Pertinent labs & imaging results that were available during my care of the patient were reviewed by me and considered in my medical decision making (see chart for details).     Altered mental status with no identifiable source. No evidence of infectious process. CT head without evidence of large metastatic disease, ICH. No metabolic derangements. Patient does have hyperglycemia but is not in DKA or HHS.   Discussed case with hospitalist who will admit for further workup and management  Final Clinical Impressions(s) / ED Diagnoses   Final diagnoses:  Disorientation      Kacee Koren, Grayce Sessions, MD 06/15/17 [phone]

## 2017-06-14 NOTE — ED Notes (Signed)
Patient transported to CT 

## 2017-06-14 NOTE — ED Notes (Signed)
Family at bedside. 

## 2017-06-14 NOTE — ED Notes (Signed)
ED Provider at bedside. 

## 2017-06-15 ENCOUNTER — Inpatient Hospital Stay (HOSPITAL_COMMUNITY): Payer: Medicare Other

## 2017-06-15 DIAGNOSIS — I10 Essential (primary) hypertension: Secondary | ICD-10-CM | POA: Diagnosis not present

## 2017-06-15 DIAGNOSIS — E785 Hyperlipidemia, unspecified: Secondary | ICD-10-CM | POA: Diagnosis present

## 2017-06-15 DIAGNOSIS — Z87891 Personal history of nicotine dependence: Secondary | ICD-10-CM | POA: Diagnosis not present

## 2017-06-15 DIAGNOSIS — Z8546 Personal history of malignant neoplasm of prostate: Secondary | ICD-10-CM | POA: Diagnosis not present

## 2017-06-15 DIAGNOSIS — Z880 Allergy status to penicillin: Secondary | ICD-10-CM | POA: Diagnosis not present

## 2017-06-15 DIAGNOSIS — E1165 Type 2 diabetes mellitus with hyperglycemia: Secondary | ICD-10-CM | POA: Diagnosis present

## 2017-06-15 DIAGNOSIS — E1122 Type 2 diabetes mellitus with diabetic chronic kidney disease: Secondary | ICD-10-CM | POA: Diagnosis present

## 2017-06-15 DIAGNOSIS — I252 Old myocardial infarction: Secondary | ICD-10-CM | POA: Diagnosis not present

## 2017-06-15 DIAGNOSIS — I129 Hypertensive chronic kidney disease with stage 1 through stage 4 chronic kidney disease, or unspecified chronic kidney disease: Secondary | ICD-10-CM | POA: Diagnosis present

## 2017-06-15 DIAGNOSIS — Z833 Family history of diabetes mellitus: Secondary | ICD-10-CM | POA: Diagnosis not present

## 2017-06-15 DIAGNOSIS — F0390 Unspecified dementia without behavioral disturbance: Secondary | ICD-10-CM | POA: Diagnosis present

## 2017-06-15 DIAGNOSIS — Z8673 Personal history of transient ischemic attack (TIA), and cerebral infarction without residual deficits: Secondary | ICD-10-CM | POA: Diagnosis not present

## 2017-06-15 DIAGNOSIS — R410 Disorientation, unspecified: Secondary | ICD-10-CM | POA: Diagnosis not present

## 2017-06-15 DIAGNOSIS — Z79899 Other long term (current) drug therapy: Secondary | ICD-10-CM | POA: Diagnosis not present

## 2017-06-15 DIAGNOSIS — Z923 Personal history of irradiation: Secondary | ICD-10-CM | POA: Diagnosis not present

## 2017-06-15 DIAGNOSIS — E11649 Type 2 diabetes mellitus with hypoglycemia without coma: Secondary | ICD-10-CM | POA: Diagnosis present

## 2017-06-15 DIAGNOSIS — E860 Dehydration: Secondary | ICD-10-CM | POA: Diagnosis present

## 2017-06-15 DIAGNOSIS — N183 Chronic kidney disease, stage 3 (moderate): Secondary | ICD-10-CM | POA: Diagnosis present

## 2017-06-15 DIAGNOSIS — Z7984 Long term (current) use of oral hypoglycemic drugs: Secondary | ICD-10-CM | POA: Diagnosis not present

## 2017-06-15 DIAGNOSIS — Z7902 Long term (current) use of antithrombotics/antiplatelets: Secondary | ICD-10-CM | POA: Diagnosis not present

## 2017-06-15 DIAGNOSIS — G40209 Localization-related (focal) (partial) symptomatic epilepsy and epileptic syndromes with complex partial seizures, not intractable, without status epilepticus: Secondary | ICD-10-CM | POA: Diagnosis present

## 2017-06-15 DIAGNOSIS — Z85038 Personal history of other malignant neoplasm of large intestine: Secondary | ICD-10-CM | POA: Diagnosis not present

## 2017-06-15 DIAGNOSIS — G9341 Metabolic encephalopathy: Secondary | ICD-10-CM | POA: Diagnosis present

## 2017-06-15 DIAGNOSIS — Z9221 Personal history of antineoplastic chemotherapy: Secondary | ICD-10-CM | POA: Diagnosis not present

## 2017-06-15 LAB — BASIC METABOLIC PANEL
Anion gap: 5 (ref 5–15)
BUN: 18 mg/dL (ref 6–20)
CHLORIDE: 112 mmol/L — AB (ref 101–111)
CO2: 22 mmol/L (ref 22–32)
Calcium: 8.4 mg/dL — ABNORMAL LOW (ref 8.9–10.3)
Creatinine, Ser: 0.97 mg/dL (ref 0.61–1.24)
GFR calc Af Amer: >60 mL/min (ref >60)
GFR calc non Af Amer: >60 mL/min (ref >60)
Glucose, Bld: 73 mg/dL (ref 65–99)
POTASSIUM: 3.7 mmol/L (ref 3.5–5.1)
SODIUM: 139 mmol/L (ref 135–145)

## 2017-06-15 LAB — CBC
HEMATOCRIT: 30.2 % — AB (ref 39.0–52.0)
Hemoglobin: 10.2 g/dL — ABNORMAL LOW (ref 13.0–17.0)
MCH: 30.3 pg (ref 26.0–34.0)
MCHC: 33.8 g/dL (ref 30.0–36.0)
MCV: 89.6 fL (ref 78.0–100.0)
Platelets: 176 10*3/uL (ref 150–400)
RBC: 3.37 MIL/uL — ABNORMAL LOW (ref 4.22–5.81)
RDW: 13.1 % (ref 11.5–15.5)
WBC: 4.4 10*3/uL (ref 4.0–10.5)

## 2017-06-15 LAB — GLUCOSE, CAPILLARY
GLUCOSE-CAPILLARY: 61 mg/dL — AB (ref 65–99)
GLUCOSE-CAPILLARY: 162 mg/dL — AB (ref 65–99)
GLUCOSE-CAPILLARY: 274 mg/dL — AB (ref 65–99)
GLUCOSE-CAPILLARY: 261 mg/dL — AB (ref 65–99)
Glucose-Capillary: 212 mg/dL — ABNORMAL HIGH (ref 65–99)

## 2017-06-15 LAB — HEMOGLOBIN A1C
Hgb A1c MFr Bld: 11.2 % — ABNORMAL HIGH (ref 4.8–5.6)
Mean Plasma Glucose: 274.74 mg/dL

## 2017-06-15 MED ORDER — SITAGLIPTIN PHOSPHATE 50 MG PO TABS: 50.0000 mg | ORAL_TABLET | Freq: Every day | ORAL | 0 refills | Status: DC

## 2017-06-15 MED ORDER — DEXTROSE 50 % IV SOLN
25.0000 mL | Freq: Once | INTRAVENOUS | Status: AC
  Administered 2017-06-15: 25 mL via INTRAVENOUS
  Filled 2017-06-15: qty 50

## 2017-06-15 MED ORDER — METFORMIN HCL 500 MG PO TABS: 1000.0000 mg | ORAL_TABLET | Freq: Two times a day (BID) | ORAL | 0 refills | Status: DC

## 2017-06-15 MED ORDER — LISINOPRIL 20 MG PO TABS: 20.0000 mg | ORAL_TABLET | Freq: Every day | ORAL | Status: AC

## 2017-06-15 NOTE — Progress Notes (Signed)
Addendum: Discharge cancelled, [name] had 2 episodes after discharge where he had transient confusion, blank stares, ? Seizure like episodes which spontaneously resolved. Will check MRI Brain and EEG  [name], MD

## 2017-06-15 NOTE — Progress Notes (Signed)
Initial Nutrition Assessment  DOCUMENTATION CODES:   Non-severe (moderate) malnutrition in context of chronic illness  INTERVENTION:   Glucerna Shake po TID, each supplement provides 220 kcal and 10 grams of protein  NUTRITION DIAGNOSIS:   Malnutrition (Moderate) related to chronic illness (prostate/ colon cancer s/p colectomy) as evidenced by moderate depletions of muscle mass, moderate depletion of body fat.  GOAL:   Patient will meet greater than or equal to 90% of their needs  MONITOR:   PO intake, Supplement acceptance, Labs, Weight trends  REASON FOR ASSESSMENT:   Malnutrition Screening Tool    ASSESSMENT:   Pt with PMH of HTN, HLD, DM, stroke, CKD III, prostate cancer s/p surgery and chemotherapy (unknown timeline), colon cancer s/p colectomy. Presents this admission with acute metabolic encephalopathy from unknown etiology.     Pt confused upon assessment. No family at bedside to help with nutrition history. Reports having no loss in appetite prior to admission, eating three meals a day. Meals consist of chicken, vegetables, and rice. Pt does not use supplementation at home.   Wt records limited in history over past year. Pt reports he has lost wt but unable to quantity how many lbs. Suspect since mental decline, pt has lost wt.   Nutrition-Focused physical exam completed. Findings are moderate fat depletion, moderate muscle depletion, and no edema. It looks as if the pt has lost wt from nutrition focused physical exam findings. Hard to tel with limited history. Will try to see family this visit to get more information. Will provide supplementation this hospital stay and monitor PO intake.   Medications reviewed and include: ferrous sulfate, SSI Labs reviewed: Cl 112 (H) Calcium 8.4 (L) CBG 73-461  Diet Order:  Diet Carb Modified Fluid consistency: Thin; Room service appropriate? Yes  Skin:  Reviewed, no issues  Last BM:  PTA  Height:   Ht Readings from Last 1  Encounters:  06/14/17 5\' 9"  (1.753 m)    Weight:   Wt Readings from Last 1 Encounters:  06/14/17 160 lb (72.6 kg)    Ideal Body Weight:  72.7 kg  BMI:  Body mass index is 23.63 kg/m.  Estimated Nutritional Needs:   Kcal:  1800-2100 (25-29 kcal/kg)  Protein:  90-100 grams (1.2-1.4 g/kg)  Fluid:  >1.8 L/day  EDUCATION NEEDS:   No education needs identified at this time  [place], LDN Clinical Nutrition Pager # - [phone]

## 2017-06-15 NOTE — Progress Notes (Addendum)
RN called to the room by physical therapist. States that patient is confused; has blurred vision; unable to make eye contact and his dizzy. Upon arrival, patient was back in bed; alert and oriented; vital signs stable; neuro checks are normal; patient states he is still a little dizzy but denies any other symptoms. MD paged. Received orders to take orthostatic vitals. Will continue to monitor the patient.

## 2017-06-15 NOTE — Progress Notes (Signed)
Upon arrival in room to administer medications to patient, patient was found to be in a confused state; eyes rolled back up; patient was unable to make any eye contact. Patient stayed in that state for approximately 2 minutes. Patient's son was at bedside. Charge RN in the room while performing neuro checks and checking vital signs. Vital signs stable. Md notified. Seizure precautions in place. Orders received to do MRI and EEG.

## 2017-06-15 NOTE — Discharge Summary (Signed)
Physician Discharge Summary  [name] LZJ:[id number] DOB: [birth year] DOA: 06/14/2017  PCP: Charolette Forward, MD  Admit date: 06/14/2017 Discharge date: 06/15/2017  Time spent: 35 minutes  Recommendations for Outpatient Follow-up:  1. Needs referral to Endocrinology for uncontrolled DM   Discharge Diagnoses:  Principal Problem:   Acute metabolic encephalopathy   Short Term memory loss >1 year,-suspected early dementia   Hyperglycemia   Dehydration   Hypertension   Type II diabetes mellitus with renal manifestations (HCC)   Stroke (HCC)   CKD (chronic kidney disease), stage III   HLD (hyperlipidemia)   Discharge Condition: stable  Diet recommendation: diabetic  Filed Weights   06/14/17 1700  Weight: 72.6 kg (160 lb)    History of present illness:  [name] is a 83 y.o. male with medical history significant of hypertension, hyperlipidemia, diabetes mellitus, possible stroke, prostate cancer (s/p of surgery and possible chemo per his son), colon cancer (s/p of right colectomy, radiation and chemotherapy), who presents with altered mental status. Per patient's son, patient's memory has been gradually declining in this year, but today his mental status has acutely worsened. Patient was found to be disorientated, urinated on the cough and hence brought to the ER  Hospital Course:  Acute metabolic encephalopathy:  -due to hyperglycemia and dehydration in the background of memory loss/ Likely early dementia, son reports issues with short-term memory loss for 1-2 years -Urinalysis negative, CXR negative, labs unremarkable except for hyperglycemia, UDS negative, renal function stable and CT head is negative for acute intracranial abnormalities. -resolved with hydration and some insulin -see discussion on DM below  Type II diabetes mellitus with renal manifestations and hypoglycemia: - poorly controled, on metformin and Amaryl at home - Hba1c is 11.2 suggesting poorly  controlled DM -I called and discussed situation with patient's son, Even though Mr. [name] lives with his son, patient is alone most of the day, and with his memory/cognitive issues would be high risk to start on insulin, monitor symptoms of hypoglycemia etc. -Hence I elected to increase his metformin to 1000mg  twice a day and started him on Januvia -Home health RN set up for medication management -Would benefit from referral to endocrinology for better diabetes management in somebody with early dementia, also talked to the son about considering assisted living, if this continues to be an issue.  HTN: -Continue lisinopril  Hx of stroke ([place]):  -Continue Plavix and Lipitor  HLD: -Lipitor  CKD (chronic kidney disease), stage III: stable. Baseline creatinine 1.2-1.3. His creatinine is 1.32 on admission. -Stable  Discharge Exam: Vitals:   06/15/17 0520 06/15/17 1414  BP: (!) 118/53 (!) 151/69  Pulse: 64 82  Resp: 16 16  Temp: 98.6 F (37 C) 99 F (37.2 C)  SpO2: 100% 100%    General: AAO to self and place Cardiovascular: S1S2/RRR Respiratory: CTAB  Discharge Instructions   Discharge Instructions    Diet - low sodium heart healthy    Complete by:  As directed    Diet Carb Modified    Complete by:  As directed    Increase activity slowly    Complete by:  As directed      Current Discharge Medication List    START taking these medications   Details  sitaGLIPtin (JANUVIA) 50 MG tablet Take 1 tablet (50 mg total) by mouth daily. Qty: 30 tablet, Refills: 0      CONTINUE these medications which have CHANGED   Details  lisinopril (PRINIVIL,ZESTRIL) 20 MG tablet Take 1 tablet (  20 mg total) by mouth daily.    metFORMIN (GLUCOPHAGE) 500 MG tablet Take 2 tablets (1,000 mg total) by mouth 2 (two) times daily with a meal. Qty: 30 tablet, Refills: 0      CONTINUE these medications which have NOT CHANGED   Details  atorvastatin (LIPITOR) 40 MG tablet As directed    Associated Diagnoses: Malignant neoplasm of cecum (HCC)    ferrous sulfate 325 (65 FE) MG tablet Take 325 mg by mouth 2 (two) times daily.   Associated Diagnoses: Colon cancer ([place])    glimepiride (AMARYL) 4 MG tablet Take 4 mg by mouth 2 (two) times daily.     ONE TOUCH ULTRA TEST test strip       STOP taking these medications     HYDROcodone-acetaminophen (NORCO) 5-325 MG per tablet      lidocaine-prilocaine (EMLA) cream        Allergies  Allergen Reactions  . Penicillins Swelling    Has patient had a PCN reaction causing immediate rash, facial/tongue/throat swelling, SOB or lightheadedness with hypotension: Unknown Has patient had a PCN reaction causing severe rash involving mucus membranes or skin necrosis: Unknown Has patient had a PCN reaction that required hospitalization: Unknown Has patient had a PCN reaction occurring within the last 10 years: Unknown If all of the above answers are "NO", then may proceed with Cephalosporin use.  Happened in childhood   Follow-up Information    Charolette Forward, MD. Schedule an appointment as soon as possible for a visit in 1 week(s).   Specialty:  Cardiology Contact information: [address] [place] [address] [phone]            The results of significant diagnostics from this hospitalization (including imaging, microbiology, ancillary and laboratory) are listed below for reference.    Significant Diagnostic Studies: Ct Head Wo Contrast  Result Date: 06/14/2017 CLINICAL DATA:  83 y/o M; unexplained altered level of consciousness. EXAM: CT HEAD WITHOUT CONTRAST TECHNIQUE: Contiguous axial images were obtained from the base of the skull through the vertex without intravenous contrast. COMPARISON:  04/29/2017 CT and MRI head. FINDINGS: Brain: No evidence of acute infarction, hemorrhage, hydrocephalus, extra-axial collection or mass lesion/mass effect. Small chronic cortical infarct in the right frontal  operculum. Stable mild chronic microvascular ischemic changes of the brain and moderate parenchymal volume loss with anterior frontal lobe predominance. Vascular: Extensive calcific atherosclerosis of carotid siphons. No hyperdense vessel identified peer Skull: Normal. Negative for fracture or focal lesion. Sinuses/Orbits: No acute finding. Other: None. IMPRESSION: 1. No acute intracranial abnormality identified. 2. Stable mild chronic microvascular ischemic changes and moderate brain parenchymal volume loss with anterior temporal lobe predominance. Electronically Signed   By: Kristine Garbe M.D.   On: 06/14/2017 18:50    Microbiology: No results found for this or any previous visit (from the past 240 hour(s)).   Labs: Basic Metabolic Panel:  Recent Labs Lab 06/14/17 1809 06/14/17 1810 06/15/17 0632  NA 135 134* 139  K 5.0 5.0 3.7  CL 99* 100* 112*  CO2  --  24 22  GLUCOSE 460* 461* 73  BUN 28* 27* 18  CREATININE 1.20 1.32* 0.97  CALCIUM  --  10.0 8.4*   Liver Function Tests:  Recent Labs Lab 06/14/17 1810  AST 20  ALT 28  ALKPHOS 113  BILITOT 0.7  PROT 7.9  ALBUMIN 4.6   No results for input(s): LIPASE, AMYLASE in the last 168 hours.  Recent Labs Lab 06/14/17 1804  AMMONIA 9   CBC:  Recent Labs Lab 06/14/17 1809 06/14/17 1810 06/15/17 0632  WBC  --  4.8 4.4  NEUTROABS  --  3.2  --   HGB 13.3 13.2 10.2*  HCT 39.0 38.2* 30.2*  MCV  --  89.5 89.6  PLT  --  216 176   Cardiac Enzymes: No results for input(s): CKTOTAL, CKMB, CKMBINDEX, TROPONINI in the last 168 hours. BNP: BNP (last 3 results) No results for input(s): BNP in the last 8760 hours.  ProBNP (last 3 results) No results for input(s): PROBNP in the last 8760 hours.  CBG:  Recent Labs Lab 06/14/17 1716 06/14/17 2218 06/15/17 0753 06/15/17 0830 06/15/17 1159  GLUCAP 475* 270* 61* 162* 212*       SignedDomenic Polite MD.  [facility] 06/15/2017, 2:41 PM

## 2017-06-15 NOTE — Progress Notes (Addendum)
CRITICAL VALUE ALERT  Critical Value:  CBG 61  Date & Time Notied:  06/15/2017 0800  Provider Notified: Broadus John  Orders Received/Actions taken: hypoglycemic protocol; patient is NPO. d50 25 ml given. Will recheck cbg in 15 mins.  Rechecked CBG 162

## 2017-06-15 NOTE — Evaluation (Signed)
Physical Therapy Evaluation Patient Details Name: [name] MRN: [id number] DOB: [birth year] Today's Date: 06/15/2017   History of Present Illness   83 y.o. male with medical history significant of hypertension, hyperlipidemia, diabetes mellitus, possible stroke, prostate cancer (s/p of surgery and possible chemo per his son), colon cancer (s/p of right colectomy, radiation and chemotherapy), who presents with altered mental status  Clinical Impression  Pt admitted with above diagnosis. Pt currently with functional limitations due to the deficits listed below (see PT Problem List). Pt will benefit from skilled PT to increase their independence and safety with mobility to allow discharge to the venue listed below.  Pt's cognition appropriate upon entering room however after ambulating a few feet into hallway, pt reported eye movement, blurred vision, and unable to focus.  Pt assisted back to room and no longer following simple commands well.  Pt reported feeling "like he's drunk."  Pt then unable to recall his descriptions and event accurately.  Vitals obtained and documented by RN who was called into room to assess pt.  Please see below for more specific details (mobility section).       Follow Up Recommendations Home health PT;Supervision/Assistance - 24 hour    Equipment Recommendations  Cane (possibly would benefit from cane, TBD)    Recommendations for Other Services       Precautions / Restrictions Precautions Precautions: Fall      Mobility  Bed Mobility Overal bed mobility: Needs Assistance Bed Mobility: Supine to Sit;Sit to Supine     Supine to sit: Supervision;HOB elevated Sit to supine: Mod assist   General bed mobility comments: assist for return to supine due to decreased cognition  Transfers Overall transfer level: Needs assistance Equipment used: None Transfers: Sit to/from Stand Sit to Stand: Min assist         General transfer comment: verbal cues for  technique to stand, assist to return to sitting with manual cue at hips due to decreased cognition - pt with increased difficulty following simple commands  Ambulation/Gait Ambulation/Gait assistance: Min guard Ambulation Distance (Feet): 30 Feet Assistive device: None (pt pushed IV pole) Gait Pattern/deviations: Step-through pattern;Decreased stride length;Narrow base of support     General Gait Details: after approx 15 feet of ambulating, pt reported eyes moving uncontrollable, did not observe nystagmus however pt not able to focus on stationary object (gaze looking off to right side and slightly upward), pt states yes to blurry vision/double vision so assisted pt back to room; upon returning to room pt appeared more confused and unable to follow simple commands; vitals obtained and RN into room (RN documentated)  Stairs            Wheelchair Mobility    Modified Rankin (Stroke Patients Only)       Balance Overall balance assessment: Needs assistance (pt denies hx of falls)         Standing balance support: No upper extremity supported Standing balance-Leahy Scale: Fair                               Pertinent Vitals/Pain Pain Assessment: No/denies pain    Home Living Family/patient expects to be discharged to:: Private residence Living Arrangements: Children   Type of Home: House Home Access: Stairs to enter Entrance Stairs-Rails: None Entrance Stairs-Number of Steps: 8 Home Layout: Two level Home Equipment: None      Prior Function Level of Independence: Independent  Hand Dominance        Extremity/Trunk Assessment        Lower Extremity Assessment Lower Extremity Assessment: Generalized weakness       Communication   Communication: No difficulties  Cognition Arousal/Alertness: Awake/alert Behavior During Therapy: WFL for tasks assessed/performed Overall Cognitive Status: Impaired/Different from baseline                                  General Comments: decreased short term memory - per chart possibly early dementia, pt also did not recall events or his descriptions from ambulating in hallway      General Comments      Exercises     Assessment/Plan    PT Assessment Patient needs continued PT services  PT Problem List Decreased strength;Decreased mobility;Decreased knowledge of use of DME;Decreased balance;Decreased cognition       PT Treatment Interventions DME instruction;Therapeutic activities;Gait training;Functional mobility training;Balance training;Therapeutic exercise;Patient/family education    PT Goals (Current goals can be found in the Care Plan section)  Acute Rehab PT Goals PT Goal Formulation: With patient Time For Goal Achievement: 06/22/17 Potential to Achieve Goals: Good    Frequency Min 3X/week   Barriers to discharge        Co-evaluation               AM-PAC PT "6 Clicks" Daily Activity  Outcome Measure Difficulty turning over in bed (including adjusting bedclothes, sheets and blankets)?: None Difficulty moving from lying on back to sitting on the side of the bed? : Unable Difficulty sitting down on and standing up from a chair with arms (e.g., wheelchair, bedside commode, etc,.)?: Unable Help needed moving to and from a bed to chair (including a wheelchair)?: A Little Help needed walking in hospital room?: A Little Help needed climbing 3-5 steps with a railing? : A Lot 6 Click Score: 14    End of Session Equipment Utilized During Treatment: Gait belt Activity Tolerance: Treatment limited secondary to medical complications (Comment) Patient left: in bed;with bed alarm set;with call bell/phone within reach;with nursing/sitter in room Nurse Communication: Mobility status PT Visit Diagnosis: Difficulty in walking, not elsewhere classified (R26.2)    Time: 3545-6256 PT Time Calculation (min) (ACUTE ONLY): 17 min   Charges:   PT  Evaluation $PT Eval Low Complexity: 1 Low     PT G Codes:   PT G-Codes **NOT FOR INPATIENT CLASS** Functional Assessment Tool Used: AM-PAC 6 Clicks Basic Mobility;Clinical judgement Functional Limitation: Mobility: Walking and moving around Mobility: Walking and Moving Around Current Status (L8937): At least 40 percent but less than 60 percent impaired, limited or restricted Mobility: Walking and Moving Around Goal Status [id number]): At least 1 percent but less than 20 percent impaired, limited or restricted    Carmelia Bake, PT, DPT 06/15/2017 Pager: [phone]  York Ram E 06/15/2017, 3:30 PM

## 2017-06-15 NOTE — Care Management Note (Signed)
Case Management Note  Patient Details  Name: [name] MRN: [id number] Date of Birth: Oct 19, 1931  Subjective/Objective:                  pt was found to have blood sugar 461 with normal anion gap and bicarbonate 24, stable renal function, negative UDS, negative urinalysis, WBC 4.8, alcohol level less than 5, ammonia 9, temperature normal, no tachycardia, oxygen saturation 100% on room air, CT head is negative for acute intracranial abnormalities  Action/Plan: Date:  June 15, 2017 Chart reviewed for concurrent status and case management needs. Will continue to follow patient progress. Discharge Planning: following for needs Expected discharge date: [id number] Velva Harman, BSN, [place], [place]  Expected Discharge Date:   (unknown)               Expected Discharge Plan:  Home/Self Care  In-House Referral:     Discharge planning Services  CM Consult  Post Acute Care Choice:    Choice offered to:     DME Arranged:    DME Agency:     HH Arranged:    [place] Agency:     Status of Service:  In process, will continue to follow  If discussed at Long Length of Stay Meetings, dates discussed:    Additional Comments:  Leeroy Cha, RN 06/15/2017, 9:10 AM

## 2017-06-16 DIAGNOSIS — R410 Disorientation, unspecified: Secondary | ICD-10-CM

## 2017-06-16 LAB — GLUCOSE, CAPILLARY
GLUCOSE-CAPILLARY: 320 mg/dL — AB (ref 65–99)
Glucose-Capillary: 232 mg/dL — ABNORMAL HIGH (ref 65–99)
Glucose-Capillary: 235 mg/dL — ABNORMAL HIGH (ref 65–99)
Glucose-Capillary: 117 mg/dL — ABNORMAL HIGH (ref 65–99)

## 2017-06-16 LAB — TSH: TSH: 3.151 u[IU]/mL (ref 0.350–4.500)

## 2017-06-16 MED ORDER — LEVETIRACETAM 500 MG/5ML IV SOLN
500.0000 mg | Freq: Two times a day (BID) | INTRAVENOUS | Status: DC
  Administered 2017-06-16 – 2017-06-18 (×5): 500 mg via INTRAVENOUS
  Filled 2017-06-16 (×5): qty 5

## 2017-06-16 NOTE — Progress Notes (Signed)
PROGRESS NOTE    [name]  GLO:[id number] DOB: 01/21/1931 DOA: 06/14/2017 PCP: Charolette Forward, MD  Brief Narrative: Keith Maddoxis a 83 y.o.malewith medical history significant of hypertension, hyperlipidemia, diabetes mellitus, possible stroke, prostate cancer (s/p of surgery and possible chemo per his son), colon cancer (s/p of right colectomy, radiation and chemotherapy), who presents with altered mental status. Per patient's son, patient's memory has been gradually declining in this year, but 8/23 his mental status has acutely worsened. Patient was found tobe disorientated, hence brought to the ER   Assessment & Plan:   Acute Encephalopathy: -initially felt to be related to hyperglycemia and dehydration -however yesterday he had 2 episodes in the hospital with acute confusion, eyes rolled back not making eye contact during second episode, which raises the concern for complex partial seizures -hence I cancelled his discharge, ordered MRI and EEG, -MRI without acute findings, old CVA and atrophy -Neuro consult requested, d/w Dr.Arora who recommended keppra-he will FU -no fevers/leukocytosis, UA/ammonia unremarkable  Type II diabetes mellitus with renal manifestations and hypoglycemia: - poorly controled, on metformin and Amarylat home - Hba1c is 11.2 suggesting poorly controlled DM -I called and discussed situation with patient's son, Even though Mr. [name] lives with his son, patient is alone most of the day, and with his memory/cognitive issues would be high risk to start on insulin, monitor symptoms of hypoglycemia etc. -Hence at discharge will increase his metformin to 1000mg  twice a day and start him on Januvia -Home health RN set up for medication management -Would benefit from referral to endocrinology for better diabetes management in somebody with early dementia, also talked to the son about considering assisted living, if this continues to be an  issue.  HTN: -Continue lisinopril  Hx of stroke ([place]):  -Continue Plavix and Lipitor -MRI without acute findings  HLD: -Lipitor  CKD (chronic kidney disease), stage III: stable. Baseline creatinine 1.2-1.3. His creatinine is 1.32 on admission. -Stable   Consultants:   Neuro   Subjective: Feels ok, continues to have short term memory deficits  Objective: Vitals:   06/15/17 1552 06/15/17 1809 06/15/17 2130 06/16/17 0628  BP: 137/67 (!) 149/69 (!) 105/53 132/66  Pulse: 73 74 64 67  Resp:  16 18 16   Temp:  98.4 F (36.9 C) 99.5 F (37.5 C) 98.5 F (36.9 C)  TempSrc:  Oral Oral Oral  SpO2: 100% 100% 100% 98%  Weight:      Height:        Intake/Output Summary (Last 24 hours) at 06/16/17 1113 Last data filed at 06/16/17 0926  Gross per 24 hour  Intake              480 ml  Output                0 ml  Net              480 ml   Filed Weights   06/14/17 1700  Weight: 72.6 kg (160 lb)    Examination:  General exam: Appears calm and comfortable, Alert and oriented to self and place Respiratory system: Clear to auscultation. Respiratory effort normal. Cardiovascular system: S1 & S2 heard, RRR. No JVD, murmurs Gastrointestinal system: Abdomen is nondistended, soft and nontender.  Normal bowel sounds heard. Central nervous system:  No focal neurological deficits. Extremities: Symmetric 5 x 5 power. Skin: No rashes, lesions or ulcers Psychiatry: Mood and affect appear appropriate    Data Reviewed:   CBC:  Recent Labs Lab 06/14/17  1809 06/14/17 1810 06/15/17 0632  WBC  --  4.8 4.4  NEUTROABS  --  3.2  --   HGB 13.3 13.2 10.2*  HCT 39.0 38.2* 30.2*  MCV  --  89.5 89.6  PLT  --  216 144   Basic Metabolic Panel:  Recent Labs Lab 06/14/17 1809 06/14/17 1810 06/15/17 0632  NA 135 134* 139  K 5.0 5.0 3.7  CL 99* 100* 112*  CO2  --  24 22  GLUCOSE 460* 461* 73  BUN 28* 27* 18  CREATININE 1.20 1.32* 0.97  CALCIUM  --  10.0 8.4*    GFR: Estimated Creatinine Clearance: 56.7 mL/min (by C-G formula based on SCr of 0.97 mg/dL). Liver Function Tests:  Recent Labs Lab 06/14/17 1810  AST 20  ALT 28  ALKPHOS 113  BILITOT 0.7  PROT 7.9  ALBUMIN 4.6   No results for input(s): LIPASE, AMYLASE in the last 168 hours.  Recent Labs Lab 06/14/17 1804  AMMONIA 9   Coagulation Profile: No results for input(s): INR, PROTIME in the last 168 hours. Cardiac Enzymes: No results for input(s): CKTOTAL, CKMB, CKMBINDEX, TROPONINI in the last 168 hours. BNP (last 3 results) No results for input(s): PROBNP in the last 8760 hours. HbA1C:  Recent Labs  06/15/17 0632  HGBA1C 11.2*   CBG:  Recent Labs Lab 06/15/17 0830 06/15/17 1159 06/15/17 1716 06/15/17 2126 06/16/17 0734  GLUCAP 162* 212* 274* 261* 232*   Lipid Profile: No results for input(s): CHOL, HDL, LDLCALC, TRIG, CHOLHDL, LDLDIRECT in the last 72 hours. Thyroid Function Tests: No results for input(s): TSH, T4TOTAL, FREET4, T3FREE, THYROIDAB in the last 72 hours. Anemia Panel: No results for input(s): VITAMINB12, FOLATE, FERRITIN, TIBC, IRON, RETICCTPCT in the last 72 hours. Urine analysis:    Component Value Date/Time   COLORURINE YELLOW 06/14/2017 1737   APPEARANCEUR CLEAR 06/14/2017 1737   LABSPEC 1.028 06/14/2017 1737   PHURINE 5.0 06/14/2017 1737   GLUCOSEU >=500 (A) 06/14/2017 1737   HGBUR NEGATIVE 06/14/2017 1737   BILIRUBINUR NEGATIVE 06/14/2017 1737   KETONESUR 5 (A) 06/14/2017 1737   PROTEINUR NEGATIVE 06/14/2017 1737   UROBILINOGEN 0.2 06/04/2009 0302   NITRITE NEGATIVE 06/14/2017 1737   LEUKOCYTESUR NEGATIVE 06/14/2017 1737   Sepsis Labs: @LABRCNTIP (procalcitonin:4,lacticidven:4)  )No results found for this or any previous visit (from the past 240 hour(s)).       Radiology Studies: Ct Head Wo Contrast  Result Date: 06/14/2017 CLINICAL DATA:  83 y/o M; unexplained altered level of consciousness. EXAM: CT HEAD WITHOUT  CONTRAST TECHNIQUE: Contiguous axial images were obtained from the base of the skull through the vertex without intravenous contrast. COMPARISON:  04/29/2017 CT and MRI head. FINDINGS: Brain: No evidence of acute infarction, hemorrhage, hydrocephalus, extra-axial collection or mass lesion/mass effect. Small chronic cortical infarct in the right frontal operculum. Stable mild chronic microvascular ischemic changes of the brain and moderate parenchymal volume loss with anterior frontal lobe predominance. Vascular: Extensive calcific atherosclerosis of carotid siphons. No hyperdense vessel identified peer Skull: Normal. Negative for fracture or focal lesion. Sinuses/Orbits: No acute finding. Other: None. IMPRESSION: 1. No acute intracranial abnormality identified. 2. Stable mild chronic microvascular ischemic changes and moderate brain parenchymal volume loss with anterior temporal lobe predominance. Electronically Signed   By: Kristine Garbe M.D.   On: 06/14/2017 18:50   Mr Brain Wo Contrast  Result Date: 06/15/2017 CLINICAL DATA:  Encephalopathy. Prostate cancer. Two episodes of transient confusion and blank stares with possible seizure like activity. EXAM:  MRI HEAD WITHOUT CONTRAST TECHNIQUE: Multiplanar, multiecho pulse sequences of the brain and surrounding structures were obtained without intravenous contrast. COMPARISON:  MRI brain 04/29/2008. FINDINGS: Brain: No acute infarct, hemorrhage, or mass lesion is present. The ventricles are of normal size. Moderate diffuse atrophy has progressed. Periventricular white matter changes are noted. There is focal encephalomalacia involving the right frontal operculum compatible with a remote infarct. Ventricles are proportionate to the degree of atrophy. No significant extra-axial fluid collection is present. Vascular: Normal flow voids. Skull and upper cervical spine: The skullbase is normal. The craniocervical junction is normal. Degenerative changes are  noted the upper cervical spine. Sinuses/Orbits: The paranasal sinuses and mastoid air cells are clear. The globes and orbits are within normal limits. IMPRESSION: 1. No acute intracranial abnormality. 2. Remote right frontal operculum infarct. 3. Progressive advanced atrophy and white matter disease. Electronically Signed   By: San Morelle M.D.   On: 06/15/2017 21:03        Scheduled Meds: . atorvastatin  40 mg Oral q1800  . enoxaparin (LOVENOX) injection  40 mg Subcutaneous QHS  . ferrous sulfate  325 mg Oral BID  . insulin aspart  0-9 Units Subcutaneous TID WC  . lisinopril  20 mg Oral BID   Continuous Infusions:   LOS: 1 day    Time spent: 62min    [name], MD [facility] Pager [phone]  If 7PM-7AM, please contact night-coverage [email or web address] Password [email or web address] 06/16/2017, 11:13 AM

## 2017-06-16 NOTE — Progress Notes (Signed)
Kept jumping out of keep, attempted to walk into the hallway. Has been reoriented several times, remains confused. MD notified, telesitter started. Will continue to monitor at this time.

## 2017-06-16 NOTE — Progress Notes (Signed)
Physical Therapy Treatment Patient Details Name: [name] MRN: [id number] DOB: 12-Dec-1930 Today's Date: 06/16/2017    History of Present Illness  83 y.o. male with medical history significant of hypertension, hyperlipidemia, diabetes mellitus, possible stroke, prostate cancer (s/p of surgery and possible chemo per his son), colon cancer (s/p of right colectomy, radiation and chemotherapy), who presents with altered mental status.  ? seizure activity in hospital    PT Comments    Pt with one LOB with gait with HHA.  Pt confused and have switched recommendation to SNF.  If he is unable to go to SNF, then recommend HHPT with  24 hour A.    Follow Up Recommendations  SNF;Supervision/Assistance - 24 hour     Equipment Recommendations  Cane    Recommendations for Other Services       Precautions / Restrictions Precautions Precautions: Fall Restrictions Weight Bearing Restrictions: No    Mobility  Bed Mobility         Supine to sit: Supervision;HOB elevated Sit to supine: Supervision   General bed mobility comments: cues to position himself at an angle prior to lying down  Transfers   Equipment used: None   Sit to Stand: Min assist         General transfer comment: cues for hand placement. Multiple sit > stands for strengthening  Ambulation/Gait Ambulation/Gait assistance: Min assist Ambulation Distance (Feet): 75 Feet Assistive device: 1 person hand held assist Gait Pattern/deviations: Step-through pattern;Narrow base of support;Drifts right/left     General Gait Details: Pt drifting and had 1 near LOB requiring MIN/MOD A from PT   Stairs            Wheelchair Mobility    Modified Rankin (Stroke Patients Only)       Balance           Standing balance support: No upper extremity supported Standing balance-Leahy Scale: Fair Standing balance comment: min steadying assistance                            Cognition  Arousal/Alertness: Awake/alert Behavior During Therapy: WFL for tasks assessed/performed Overall Cognitive Status: No family/caregiver present to determine baseline cognitive functioning                                        Exercises      General Comments        Pertinent Vitals/Pain Pain Assessment: No/denies pain    Home Living                      Prior Function            PT Goals (current goals can now be found in the care plan section) Acute Rehab PT Goals Patient Stated Goal: none stated PT Goal Formulation: With patient Time For Goal Achievement: 06/22/17 Potential to Achieve Goals: Good Progress towards PT goals: Progressing toward goals    Frequency    Min 3X/week      PT Plan Discharge plan needs to be updated    Co-evaluation              AM-PAC PT "6 Clicks" Daily Activity  Outcome Measure  Difficulty turning over in bed (including adjusting bedclothes, sheets and blankets)?: None Difficulty moving from lying on back to sitting on the side of the bed? :  A Little Difficulty sitting down on and standing up from a chair with arms (e.g., wheelchair, bedside commode, etc,.)?: A Little Help needed moving to and from a bed to chair (including a wheelchair)?: A Little Help needed walking in hospital room?: A Little Help needed climbing 3-5 steps with a railing? : A Lot 6 Click Score: 18    End of Session Equipment Utilized During Treatment: Gait belt Activity Tolerance: Patient tolerated treatment well Patient left: in bed;with call bell/phone within reach;with bed alarm set Nurse Communication: Mobility status PT Visit Diagnosis: Difficulty in walking, not elsewhere classified (R26.2)     Time: 6789-3810 PT Time Calculation (min) (ACUTE ONLY): 13 min  Charges:  $Gait Training: 8-22 mins                    G Codes:       [name], [place] Pager [phone] 06/16/2017    [name] 06/16/2017, 1:31  PM

## 2017-06-16 NOTE — Progress Notes (Signed)
Pt ambulated from bed to chair with minimal assistance. No complaints voiced, will continue to monitor at this time

## 2017-06-16 NOTE — Consult Note (Addendum)
NEURO HOSPITALIST CONSULT NOTE   Requesting Physician: Dr. Broadus John    Chief Complaint: AMS  History obtained from:  Chart  HPI:                                                                                                                                      [name] is an 83 y.o. male with a past medical history significant for significant of hypertension, hyperlipidemia, diabetes mellitus, previous stroke, prostate cancer, colon cancer, who presents with altered mental status. Per the family, Mr. Renwick has been experiencing a gradual decline in memory, which worsened acutely on 06/14/17. There has been no report of a focal neurological deficit.  Mr Rentz was set to be discharged on 06/15/17, but Mr. Tomberlin had two episodes of transient confusion, ?blank stares and "seizure-like" events that resolved spontaneously. Mr. Kirn has no knowledge of these events.   He does not believe he is taking any medication currently and states that he was brought to the hospital to manage his blood pressure. He denies headache, dizziness, blurred vision, nausea/vomiting and pain.  Pertinent Imaging: CT head: No acute abnormality MRI brain: No acute abnormality. Remote right frontal operculum infarct. EEG pending  Past Medical History:  Diagnosis Date  . Colon cancer ([place])    colon ca dx 7/11  . Diabetes mellitus   . Hypertension   . MI (myocardial infarction) ([place])   . Prostate CA [facility])    prostate  . Stroke [facility])     Past Surgical History:  Procedure Laterality Date  . COLON SURGERY  06/2010   right colectomy, T3N2  . PORT-A-CATH REMOVAL N/A 10/19/2014   Procedure: MINOR REMOVAL PORT-A-CATH;  Surgeon: Autumn Messing III, MD;  Location: [place];  Service: General;  Laterality: N/A;  . PROSTATE SURGERY      Family History  Problem Relation Age of Onset  . Diabetes Mellitus II Mother     Social History:  reports that he has quit smoking. His  smoking use included Cigarettes. He has a 0.75 pack-year smoking history. He has never used smokeless tobacco. He reports that he does not drink alcohol or use drugs.  Allergies  Allergen Reactions  . Penicillins Swelling    Has patient had a PCN reaction causing immediate rash, facial/tongue/throat swelling, SOB or lightheadedness with hypotension: Unknown Has patient had a PCN reaction causing severe rash involving mucus membranes or skin necrosis: Unknown Has patient had a PCN reaction that required hospitalization: Unknown Has patient had a PCN reaction occurring within the last 10 years: Unknown If all of the above answers are "NO", then may proceed with Cephalosporin use.  Happened in childhood    MEDICATIONS:  Current Meds  Medication Sig  . atorvastatin (LIPITOR) 40 MG tablet As directed  . ferrous sulfate 325 (65 FE) MG tablet Take 325 mg by mouth 2 (two) times daily.  Marland Kitchen glimepiride (AMARYL) 4 MG tablet Take 4 mg by mouth 2 (two) times daily.   . ONE TOUCH ULTRA TEST test strip   . [DISCONTINUED] lisinopril (PRINIVIL,ZESTRIL) 20 MG tablet Take 20 mg by mouth 2 (two) times daily.  . [DISCONTINUED] metFORMIN (GLUCOPHAGE) 500 MG tablet Take 500 mg by mouth 2 (two) times daily with a meal.      Review Of Systems:                                                                                                           History obtained from unobtainable from patient due to mental status  Blood pressure 112/68, pulse 61, temperature 98.6 F (37 C), temperature source Oral, resp. rate 16, height 5\' 9"  (1.753 m), weight 72.6 kg (160 lb), SpO2 100 %.   Physical Examination:                                                                                                      General: WDWN male. Appears calm and comfortable in bed HEENT:  Normocephalic, no lesions, without  obvious abnormality.  Normal external eye and conjunctiva. Arcus senilis.  Normal and external ears. Normal external nose, mucus membranes and septum.  Normal pharynx. Cardiovascular: regular rate and rhythm, pulses palpable throughout   Pulmonary: CTA, unlabored breathing Abdomen: Soft, non-tender Extremities: no joint deformities, effusion, or inflammation, no edema Skin: warm and dry, no hyperpigmentation, vitiligo, or suspicious lesions  Neurological Examination:                                                                                               Mental Status: Fisher Lance is alert, oriented to himself and location, not to date. Could name objects and follow commands well. Conversation outside of the exam was very tangential. Cranial Nerves: II: Visual fields grossly normal, pupils are equal, round, reactive to light. III,IV, VI: Ptosis not present, extra-ocular muscle movements intact bilaterally V,VII: Smile and eyebrow raise is symmetric. Facial light touch intact bilaterally VIII: Hearing grossly intact IX,X: Uvula and  palate rise symmetrically XI: SCM and bilateral shoulder shrug strength 5/5 XII: Midline tongue extension Motor: Right :     Upper extremity   5/5   Left:     Upper extremity   5/5          Lower extremity   5/5     Lower extremity   5/5 Pronator drift not present Sensory: Pinprick and light touch intact throughout, bilaterally; Right neglect Deep Tendon Reflexes: Diminished throughout Plantars: Right: downgoing   Left: downgoing Cerebellar: Finger-to-nose test without evidence of dysmetria Gait: Deferred   Lab Results: Basic Metabolic Panel:  Recent Labs Lab 06/14/17 1809 06/14/17 1810 06/15/17 0632  NA 135 134* 139  K 5.0 5.0 3.7  CL 99* 100* 112*  CO2  --  24 22  GLUCOSE 460* 461* 73  BUN 28* 27* 18  CREATININE 1.20 1.32* 0.97  CALCIUM  --  10.0 8.4*    Liver Function Tests:  Recent Labs Lab 06/14/17 1810  AST 20  ALT 28   ALKPHOS 113  BILITOT 0.7  PROT 7.9  ALBUMIN 4.6   No results for input(s): LIPASE, AMYLASE in the last 168 hours.  Recent Labs Lab 06/14/17 1804  AMMONIA 9    CBC:  Recent Labs Lab 06/14/17 1809 06/14/17 1810 06/15/17 0632  WBC  --  4.8 4.4  NEUTROABS  --  3.2  --   HGB 13.3 13.2 10.2*  HCT 39.0 38.2* 30.2*  MCV  --  89.5 89.6  PLT  --  216 176    Cardiac Enzymes: No results for input(s): CKTOTAL, CKMB, CKMBINDEX, TROPONINI in the last 168 hours.  Lipid Panel: No results for input(s): CHOL, TRIG, HDL, CHOLHDL, VLDL, LDLCALC in the last 168 hours.  CBG:  Recent Labs Lab 06/15/17 1159 06/15/17 1716 06/15/17 2126 06/16/17 0734 06/16/17 1123  GLUCAP 212* 274* 261* 232* 235*    Microbiology: Results for orders placed or performed during the hospital encounter of 08/16/10  Surgical pcr screen     Status: None   Collection Time: 08/12/10  1:13 PM  Result Value Ref Range Status   MRSA, PCR NEGATIVE NEGATIVE Final   Staphylococcus aureus  NEGATIVE Final    NEGATIVE        The Xpert SA Assay (FDA approved for NASAL specimens only), is one component of a comprehensive surveillance program.  It is not intended to diagnose infection nor to guide or monitor treatment.    Coagulation Studies: No results for input(s): LABPROT, INR in the last 72 hours.  Imaging: Ct Head Wo Contrast  Result Date: 06/14/2017 CLINICAL DATA:  83 y/o M; unexplained altered level of consciousness. EXAM: CT HEAD WITHOUT CONTRAST TECHNIQUE: Contiguous axial images were obtained from the base of the skull through the vertex without intravenous contrast. COMPARISON:  04/29/2017 CT and MRI head. FINDINGS: Brain: No evidence of acute infarction, hemorrhage, hydrocephalus, extra-axial collection or mass lesion/mass effect. Small chronic cortical infarct in the right frontal operculum. Stable mild chronic microvascular ischemic changes of the brain and moderate parenchymal volume loss with  anterior frontal lobe predominance. Vascular: Extensive calcific atherosclerosis of carotid siphons. No hyperdense vessel identified peer Skull: Normal. Negative for fracture or focal lesion. Sinuses/Orbits: No acute finding. Other: None. IMPRESSION: 1. No acute intracranial abnormality identified. 2. Stable mild chronic microvascular ischemic changes and moderate brain parenchymal volume loss with anterior temporal lobe predominance. Electronically Signed   By: Kristine Garbe M.D.   On: 06/14/2017 18:50   Mr  Brain Wo Contrast  Result Date: 06/15/2017 CLINICAL DATA:  Encephalopathy. Prostate cancer. Two episodes of transient confusion and blank stares with possible seizure like activity. EXAM: MRI HEAD WITHOUT CONTRAST TECHNIQUE: Multiplanar, multiecho pulse sequences of the brain and surrounding structures were obtained without intravenous contrast. COMPARISON:  MRI brain 04/29/2008. FINDINGS: Brain: No acute infarct, hemorrhage, or mass lesion is present. The ventricles are of normal size. Moderate diffuse atrophy has progressed. Periventricular white matter changes are noted. There is focal encephalomalacia involving the right frontal operculum compatible with a remote infarct. Ventricles are proportionate to the degree of atrophy. No significant extra-axial fluid collection is present. Vascular: Normal flow voids. Skull and upper cervical spine: The skullbase is normal. The craniocervical junction is normal. Degenerative changes are noted the upper cervical spine. Sinuses/Orbits: The paranasal sinuses and mastoid air cells are clear. The globes and orbits are within normal limits. IMPRESSION: 1. No acute intracranial abnormality. 2. Remote right frontal operculum infarct. 3. Progressive advanced atrophy and white matter disease. Electronically Signed   By: San Morelle M.D.   On: 06/15/2017 21:03     Assessment  Mr. Hartje is a pleasant 83 year old gentleman with an acute worsening of  chronic memory loss on 06/14/17. He is metabolically stable. On 06/15/17 he was set to be discharged when he experienced two "seizure-like" episodes that resolved without intervention. 500mg  BID Keppra was started at that time. MRI and CT negative. The need for EEG is not urgent at this time as he does not have a prior history of seizure, clean imaging and he has returned to baseline. Would suggest that he is to be sent home on Keppra 500mg  BID and scheduled for an EEG in the outpatient setting.  Recommendations: 1) Continue Keppra 500mg  BID 2) Minimize the use of opiate pain medication and sedating medications as much as possible 3) Outpatient EEG 4) Please call with questions.  Solon Augusta PA-C [facility] [phone] 06/16/2017, 5:01 PM  Attending addendum Patient seen and examined independently. On exam, demonstrates Poor attention concentration. No focal cranial nerve, motor or sensory deficits at this time. I agree with the note above and agree with the note above with the exceptions documented below: I have reviewed the imaging independently, CT scan of the head shows no acute findings. There is evidence of old stroke on the MRI in the right frontal region.  Given the fact that he had these episodes that were presumably very stereotypic, has a history of dementia and history of old stroke with structural evidence of damage to the brain, and episodes have happened more than one time, I feel reasonable starting him on a anti-epileptic medication at this time.  He should definitely follow with neurology as an outpatient for possible seizures as well as for memory disorder evaluation.  Recommendations: -Keppra 500 twice a day. -EEG should be obtained but can be done as an outpatient and should not hold discharge of the patient is ready to be discharged tomorrow. -Minimize sedating medications and medications with seizure threshold lowering effects. -Maintain seizure  precautions -Outpatient neurology follow-up for seizure and memory disorder evaluation.  Call with questions as needed.  Amie Portland, MD [facility] [phone]  If 7pm to 7am, please call on call as listed on AMION.

## 2017-06-16 NOTE — Evaluation (Signed)
Occupational Therapy Evaluation Patient Details Name: [name] MRN: [id number] DOB: 23-Jul-1931 Today's Date: 06/16/2017    History of Present Illness  83 y.o. male with medical history significant of hypertension, hyperlipidemia, diabetes mellitus, possible stroke, prostate cancer (s/p of surgery and possible chemo per his son), colon cancer (s/p of right colectomy, radiation and chemotherapy), who presents with altered mental status.  ? seizure activity in hospital   Clinical Impression   This 83 year old man was admitted for the above.  He reports that he was independent with adls prior to admission. Recommend 24/7 or SNF at discharge. OT will follow him in acute settign with supervision level goals.  He currently needs min A for adls    Follow Up Recommendations  SNF;Supervision/Assistance - 24 hour (if home, HHOT)    Equipment Recommendations   (to be further assessed, family may want tub bench)    Recommendations for Other Services       Precautions / Restrictions Precautions Precautions: Fall Restrictions Weight Bearing Restrictions: No      Mobility Bed Mobility         Supine to sit: Supervision;HOB elevated Sit to supine: Supervision   General bed mobility comments: cues to position himself at an angle prior to lying down  Transfers   Equipment used: None   Sit to Stand: Min assist         General transfer comment: steadying assistance. Took several steps in room    Balance               Standing balance comment: min steadying assistance                           ADL either performed or assessed with clinical judgement   ADL Overall ADL's : Needs assistance/impaired Eating/Feeding: Set up;Supervision/ safety;Bed level   Grooming: Wash/dry hands;Set up;Sitting   Upper Body Bathing: Set up;Sitting   Lower Body Bathing: Minimal assistance;Sit to/from stand   Upper Body Dressing : Set up;Sitting   Lower Body Dressing:  Minimal assistance;Sit to/from stand   Toilet Transfer: Minimal assistance;Transfer board   Toileting- Clothing Manipulation and Hygiene: Minimal assistance;Sit to/from stand         General ADL Comments: pt needs min A for steadying. Did not use RW this session:  may help to use next time.  Pt had difficulty opening things on tray.  Used fork to stir coffee.  Found spoon and scooped several sips with spoon.     Vision  not assessed; wfls during adl tasks during assessment       Perception     Praxis      Pertinent Vitals/Pain Pain Assessment: No/denies pain     Hand Dominance     Extremity/Trunk Assessment Upper Extremity Assessment Upper Extremity Assessment: Generalized weakness (decreased motor control eccentrically)           Communication Communication Communication: No difficulties   Cognition Arousal/Alertness: Awake/alert Behavior During Therapy: WFL for tasks assessed/performed Overall Cognitive Status: No family/caregiver present to determine baseline cognitive functioning                                     General Comments       Exercises     Shoulder Instructions      Home Living Family/patient expects to be discharged to:: Private residence Living Arrangements: Children  Available Help at Discharge: Family               Bathroom Shower/Tub: Teacher, early years/pre: Standard     Home Equipment: None   Additional Comments: washes at sink most of the time      Prior Functioning/Environment Level of Independence: Independent                 OT Problem List: Decreased strength;Impaired balance (sitting and/or standing);Decreased activity tolerance;Decreased safety awareness      OT Treatment/Interventions: Self-care/ADL training;DME and/or AE instruction;Patient/family education;Balance training;Therapeutic activities    OT Goals(Current goals can be found in the care plan section) Acute Rehab OT  Goals Patient Stated Goal: none stated OT Goal Formulation: With patient Time For Goal Achievement: 06/23/17 Potential to Achieve Goals: Good ADL Goals Pt Will Perform Lower Body Bathing: with supervision;sit to/from stand Pt Will Perform Lower Body Dressing: with supervision;sit to/from stand Pt Will Transfer to Toilet: with supervision;ambulating;regular height toilet Pt Will Perform Toileting - Clothing Manipulation and hygiene: with supervision;sit to/from stand Pt Will Perform Tub/Shower Transfer: with supervision;ambulating;tub bench (vs min guard with tub seat) Additional ADL Goal #1: pt will perform a set of 10 x 3 shoulder exercises bil with cues to increase strength for adls  OT Frequency: Min 2X/week   Barriers to D/C:            Co-evaluation              AM-PAC PT "6 Clicks" Daily Activity     Outcome Measure Help from another person eating meals?: A Little Help from another person taking care of personal grooming?: A Little Help from another person toileting, which includes using toliet, bedpan, or urinal?: A Little Help from another person bathing (including washing, rinsing, drying)?: A Little Help from another person to put on and taking off regular upper body clothing?: A Little Help from another person to put on and taking off regular lower body clothing?: A Little 6 Click Score: 18   End of Session    Activity Tolerance: Patient tolerated treatment well Patient left: in bed;with call bell/phone within reach;with bed alarm set  OT Visit Diagnosis: Unsteadiness on feet (R26.81)                Time: 1324-4010 OT Time Calculation (min): 17 min Charges:  OT General Charges $OT Visit: 1 Procedure OT Evaluation $OT Eval Low Complexity: 1 Procedure G-Codes:     [place], OTR/L [phone] 06/16/2017  Emylia Latella 06/16/2017, 10:50 AM

## 2017-06-17 LAB — BASIC METABOLIC PANEL
Anion gap: 6 (ref 5–15)
BUN: 15 mg/dL (ref 6–20)
CALCIUM: 9.2 mg/dL (ref 8.9–10.3)
CO2: 25 mmol/L (ref 22–32)
Chloride: 109 mmol/L (ref 101–111)
Creatinine, Ser: 1.03 mg/dL (ref 0.61–1.24)
GFR calc Af Amer: >60 mL/min (ref >60)
GLUCOSE: 95 mg/dL (ref 65–99)
Potassium: 3.9 mmol/L (ref 3.5–5.1)
Sodium: 140 mmol/L (ref 135–145)

## 2017-06-17 LAB — CBC
HCT: 31.9 % — ABNORMAL LOW (ref 39.0–52.0)
Hemoglobin: 11 g/dL — ABNORMAL LOW (ref 13.0–17.0)
MCH: 30.8 pg (ref 26.0–34.0)
MCHC: 34.5 g/dL (ref 30.0–36.0)
MCV: 89.4 fL (ref 78.0–100.0)
PLATELETS: 190 10*3/uL (ref 150–400)
RBC: 3.57 MIL/uL — ABNORMAL LOW (ref 4.22–5.81)
RDW: 12.9 % (ref 11.5–15.5)
WBC: 3.4 10*3/uL — ABNORMAL LOW (ref 4.0–10.5)

## 2017-06-17 LAB — GLUCOSE, CAPILLARY
GLUCOSE-CAPILLARY: 284 mg/dL — AB (ref 65–99)
Glucose-Capillary: 97 mg/dL (ref 65–99)
Glucose-Capillary: 285 mg/dL — ABNORMAL HIGH (ref 65–99)
Glucose-Capillary: 358 mg/dL — ABNORMAL HIGH (ref 65–99)

## 2017-06-17 MED ORDER — INSULIN ASPART 100 UNIT/ML ~~LOC~~ SOLN
6.0000 [IU] | Freq: Once | SUBCUTANEOUS | Status: AC
  Administered 2017-06-17: 6 [IU] via SUBCUTANEOUS

## 2017-06-17 NOTE — NC FL2 (Signed)
[place] LEVEL OF CARE SCREENING TOOL     IDENTIFICATION  Patient Name: [name] Birthdate: Mar 05, 1931 Sex: male Admission Date (Current Location): 06/14/2017  [facility] and [place] Number:  Herbalist and Address:  [facility],  [place] [address] [place]      Provider Number: [id number]  Attending Physician Name and Address:  [name], MD  Relative Name and Phone Number:       Current Level of Care: Hospital Recommended Level of Care: [place] Prior Approval Number:    Date Approved/Denied:   PASRR Number: [id number]  Discharge Plan: SNF    Current Diagnoses: Patient Active Problem List   Diagnosis Date Noted  . Acute metabolic encephalopathy 62/70/3500  . CKD (chronic kidney disease), stage III 06/14/2017  . HLD (hyperlipidemia) 06/14/2017  . Stroke ([place])   . Hypertension 06/05/2012  . Type II diabetes mellitus with renal manifestations ([place]) 06/05/2012  . Malignant neoplasm of cecum ([place]) 08/27/2011    Orientation RESPIRATION BLADDER Height & Weight     Self  Normal External catheter Weight: 160 lb (72.6 kg) Height:  5\' 9"  (175.3 cm)  BEHAVIORAL SYMPTOMS/MOOD NEUROLOGICAL BOWEL NUTRITION STATUS      Continent    AMBULATORY STATUS COMMUNICATION OF NEEDS Skin   Extensive Assist Verbally Normal                       Personal Care Assistance Level of Assistance  Bathing, Dressing Bathing Assistance: Maximum assistance   Dressing Assistance: Maximum assistance     Functional Limitations Info             SPECIAL CARE FACTORS FREQUENCY  PT (By licensed PT), OT (By licensed OT)     PT Frequency: 5x/wk OT Frequency: 5x/wk            Contractures      Additional Factors Info  Code Status, Allergies, Insulin Sliding Scale Code Status Info: Full Allergies Info: Penicillins   Insulin Sliding Scale Info: 3x/day       Current Medications (06/17/2017):  This is the  current hospital active medication list Current Facility-Administered Medications  Medication Dose Route Frequency Provider Last Rate Last Dose  . acetaminophen (TYLENOL) tablet 650 mg  650 mg Oral Q6H PRN Ivor Costa, MD      . atorvastatin (LIPITOR) tablet 40 mg  40 mg Oral q1800 Ivor Costa, MD   40 mg at 06/16/17 1808  . enoxaparin (LOVENOX) injection 40 mg  40 mg Subcutaneous QHS Ivor Costa, MD   40 mg at 06/16/17 1944  . ferrous sulfate tablet 325 mg  325 mg Oral BID Ivor Costa, MD   325 mg at 06/17/17 1029  . hydrALAZINE (APRESOLINE) injection 5 mg  5 mg Intravenous Q2H PRN Ivor Costa, MD      . HYDROcodone-acetaminophen (NORCO/VICODIN) 5-325 MG per tablet 1-2 tablet  1-2 tablet Oral Q6H PRN Ivor Costa, MD      . insulin aspart (novoLOG) injection 0-9 Units  0-9 Units Subcutaneous TID WC Ivor Costa, MD   7 Units at 06/16/17 1807  . levETIRAcetam (KEPPRA) 500 mg in sodium chloride 0.9 % 100 mL IVPB  500 mg Intravenous Q12H [name], MD 420 mL/hr at 06/17/17 1122 500 mg at 06/17/17 1122  . lidocaine-prilocaine (EMLA) cream   Topical PRN Ivor Costa, MD      . lisinopril (PRINIVIL,ZESTRIL) tablet 20 mg  20 mg Oral BID Ivor Costa,  MD   20 mg at 06/17/17 1029  . ondansetron (ZOFRAN) injection 4 mg  4 mg Intravenous Q8H PRN Ivor Costa, MD       Facility-Administered Medications Ordered in Other Encounters  Medication Dose Route Frequency Provider Last Rate Last Dose  . sodium chloride 0.9 % injection 10 mL  10 mL Intravenous PRN Wyatt Portela, MD   10 mL at 01/22/13 1043     Discharge Medications: Please see discharge summary for a list of discharge medications.  Relevant Imaging Results:  Relevant Lab Results:   Additional Information SS#: [id number]  [name], LCSW

## 2017-06-17 NOTE — Progress Notes (Signed)
PROGRESS NOTE    [name]  ZOX:[id number] DOB: 03-Jan-1931 DOA: 06/14/2017 PCP: Charolette Forward, MD  Brief Narrative: Keith Maddoxis a 83 y.o.malewith medical history significant of hypertension, hyperlipidemia, diabetes mellitus, possible stroke, prostate cancer (s/p of surgery and possible chemo per his son), colon cancer (s/p of right colectomy, radiation and chemotherapy), who presents with altered mental status. Per patient's son, patient's memory has been gradually declining in this year, but 8/23 his mental status had acutely worsened. Patient was found tobe disorientated, hence brought to the ER   Assessment & Plan:   Acute Encephalopathy: -initially felt to be related to hyperglycemia and dehydration -however 8/24 evening he had 2 episodes in the hospital with acute confusion, eyes rolled back not making eye contact during second episode, which raises the concern for complex partial seizures -MRI without acute findings, old CVA and atrophy -Neuro consult per Dr.Arora greatly appreciated, now started on Keppra, no further episodes noted -will need EEG as outpatient -no fevers/leukocytosis, UA/ammonia unremarkable -PT/OT eval completed, SNF recommended, d/w son who agrees, CSW consulted for Rehab  Early Dementia/SHort term memory loss -ongoing for 1-2years per son  Type II diabetes mellitus with renal manifestations and hypoglycemia: - poorly controled, on metformin and Amarylat home - Hba1c is 11.2 suggesting poorly controlled DM -I called and discussed situation with patient's son, Even though [name] lives with his son, patient is alone most of the day, and with his memory/cognitive issues would be high risk to start on insulin, monitor symptoms of hypoglycemia etc. -Hence at discharge will increase his metformin to 1000mg  twice a day and start him on Januvia -Home health RN set up for medication management -Would benefit from referral to endocrinology for better  diabetes management in somebody with early dementia, also talked to the son about considering assisted living, if this continues to be an issue.  HTN: -Continue lisinopril  Hx of stroke ([place]):  -Continue Plavix and Lipitor -MRI without acute findings  HLD: -Lipitor  CKD (chronic kidney disease), stage III: stable. Baseline creatinine 1.2-1.3. His creatinine is 1.32 on admission. -Stable   Consultants:   Neuro   Subjective: Feels ok, no further episodes  Objective: Vitals:   06/15/17 2130 06/16/17 0628 06/16/17 1334 06/17/17 0642  BP: (!) 105/53 132/66 112/68 138/76  Pulse: 64 67 61 (!) 56  Resp: 18 16 16 16   Temp: 99.5 F (37.5 C) 98.5 F (36.9 C) 98.6 F (37 C) (!) 97.5 F (36.4 C)  TempSrc: Oral Oral Oral Oral  SpO2: 100% 98% 100% 99%  Weight:      Height:        Intake/Output Summary (Last 24 hours) at 06/17/17 1113 Last data filed at 06/17/17 0845  Gross per 24 hour  Intake              705 ml  Output             2800 ml  Net            -2095 ml   Filed Weights   06/14/17 1700  Weight: 72.6 kg (160 lb)    Examination:  Gen: Awake, Alert, Oriented X to self/partly to place, short term memory deficits HEENT: PERRLA, Neck supple, no JVD Lungs: Good air movement bilaterally, CTAB CVS: RRR,No Gallops,Rubs or new Murmurs Abd: soft, Non tender, non distended, BS present Extremities: No Cyanosis, Clubbing or edema Skin: no new rashes Central nervous system:  No focal neurological deficits. Psychiatry: pleasant  Data Reviewed:   CBC:  Recent Labs Lab 06/14/17 1809 06/14/17 1810 06/15/17 0632 06/17/17 0618  WBC  --  4.8 4.4 3.4*  NEUTROABS  --  3.2  --   --   HGB 13.3 13.2 10.2* 11.0*  HCT 39.0 38.2* 30.2* 31.9*  MCV  --  89.5 89.6 89.4  PLT  --  216 176 314   Basic Metabolic Panel:  Recent Labs Lab 06/14/17 1809 06/14/17 1810 06/15/17 0632 06/17/17 0618  NA 135 134* 139 140  K 5.0 5.0 3.7 3.9  CL 99* 100* 112* 109  CO2   --  24 22 25   GLUCOSE 460* 461* 73 95  BUN 28* 27* 18 15  CREATININE 1.20 1.32* 0.97 1.03  CALCIUM  --  10.0 8.4* 9.2   GFR: Estimated Creatinine Clearance: 53.4 mL/min (by C-G formula based on SCr of 1.03 mg/dL). Liver Function Tests:  Recent Labs Lab 06/14/17 1810  AST 20  ALT 28  ALKPHOS 113  BILITOT 0.7  PROT 7.9  ALBUMIN 4.6   No results for input(s): LIPASE, AMYLASE in the last 168 hours.  Recent Labs Lab 06/14/17 1804  AMMONIA 9   Coagulation Profile: No results for input(s): INR, PROTIME in the last 168 hours. Cardiac Enzymes: No results for input(s): CKTOTAL, CKMB, CKMBINDEX, TROPONINI in the last 168 hours. BNP (last 3 results) No results for input(s): PROBNP in the last 8760 hours. HbA1C:  Recent Labs  06/15/17 0632  HGBA1C 11.2*   CBG:  Recent Labs Lab 06/16/17 0734 06/16/17 1123 06/16/17 1707 06/16/17 2122 06/17/17 0735  GLUCAP 232* 235* 320* 117* 97   Lipid Profile: No results for input(s): CHOL, HDL, LDLCALC, TRIG, CHOLHDL, LDLDIRECT in the last 72 hours. Thyroid Function Tests:  Recent Labs  06/16/17 1835  TSH 3.151   Anemia Panel: No results for input(s): VITAMINB12, FOLATE, FERRITIN, TIBC, IRON, RETICCTPCT in the last 72 hours. Urine analysis:    Component Value Date/Time   COLORURINE YELLOW 06/14/2017 1737   APPEARANCEUR CLEAR 06/14/2017 1737   LABSPEC 1.028 06/14/2017 1737   PHURINE 5.0 06/14/2017 1737   GLUCOSEU >=500 (A) 06/14/2017 1737   HGBUR NEGATIVE 06/14/2017 1737   BILIRUBINUR NEGATIVE 06/14/2017 1737   KETONESUR 5 (A) 06/14/2017 1737   PROTEINUR NEGATIVE 06/14/2017 1737   UROBILINOGEN 0.2 06/04/2009 0302   NITRITE NEGATIVE 06/14/2017 1737   LEUKOCYTESUR NEGATIVE 06/14/2017 1737   Sepsis Labs: @LABRCNTIP (procalcitonin:4,lacticidven:4)  )No results found for this or any previous visit (from the past 240 hour(s)).       Radiology Studies: Mr Brain Wo Contrast  Result Date: 06/15/2017 CLINICAL DATA:   Encephalopathy. Prostate cancer. Two episodes of transient confusion and blank stares with possible seizure like activity. EXAM: MRI HEAD WITHOUT CONTRAST TECHNIQUE: Multiplanar, multiecho pulse sequences of the brain and surrounding structures were obtained without intravenous contrast. COMPARISON:  MRI brain 04/29/2008. FINDINGS: Brain: No acute infarct, hemorrhage, or mass lesion is present. The ventricles are of normal size. Moderate diffuse atrophy has progressed. Periventricular white matter changes are noted. There is focal encephalomalacia involving the right frontal operculum compatible with a remote infarct. Ventricles are proportionate to the degree of atrophy. No significant extra-axial fluid collection is present. Vascular: Normal flow voids. Skull and upper cervical spine: The skullbase is normal. The craniocervical junction is normal. Degenerative changes are noted the upper cervical spine. Sinuses/Orbits: The paranasal sinuses and mastoid air cells are clear. The globes and orbits are within normal limits. IMPRESSION: 1. No acute intracranial abnormality.  2. Remote right frontal operculum infarct. 3. Progressive advanced atrophy and white matter disease. Electronically Signed   By: San Morelle M.D.   On: 06/15/2017 21:03        Scheduled Meds: . atorvastatin  40 mg Oral q1800  . enoxaparin (LOVENOX) injection  40 mg Subcutaneous QHS  . ferrous sulfate  325 mg Oral BID  . insulin aspart  0-9 Units Subcutaneous TID WC  . lisinopril  20 mg Oral BID   Continuous Infusions: . levETIRAcetam Stopped (06/16/17 2335)     LOS: 2 days    Time spent: 50min    [name], MD [facility] Pager [phone]  If 7PM-7AM, please contact night-coverage [email or web address] Password [email or web address] 06/17/2017, 11:13 AM

## 2017-06-18 LAB — GLUCOSE, CAPILLARY
GLUCOSE-CAPILLARY: 131 mg/dL — AB (ref 65–99)
GLUCOSE-CAPILLARY: 353 mg/dL — AB (ref 65–99)
GLUCOSE-CAPILLARY: 392 mg/dL — AB (ref 65–99)

## 2017-06-18 MED ORDER — METFORMIN HCL 500 MG PO TABS: 500.0000 mg | ORAL_TABLET | Freq: Two times a day (BID) | ORAL | 0 refills | Status: DC

## 2017-06-18 MED ORDER — ACETAMINOPHEN 325 MG PO TABS: 650.0000 mg | ORAL_TABLET | Freq: Four times a day (QID) | ORAL | Status: AC | PRN

## 2017-06-18 MED ORDER — INSULIN GLARGINE 100 UNIT/ML ~~LOC~~ SOLN: 10.0000 [IU] | Freq: Every day | SUBCUTANEOUS | Status: AC

## 2017-06-18 MED ORDER — INSULIN GLARGINE 100 UNIT/ML ~~LOC~~ SOLN
10.0000 [IU] | Freq: Every day | SUBCUTANEOUS | Status: DC
  Administered 2017-06-18: 10 [IU] via SUBCUTANEOUS
  Filled 2017-06-18: qty 0.1

## 2017-06-18 NOTE — Discharge Summary (Signed)
Physician Discharge Summary  [name] Surgeon JQB:[id number] DOB: 1931/10/18 DOA: 06/14/2017  PCP: Charolette Forward, MD  Admit date: 06/14/2017 Discharge date: 06/18/2017  Time spent: 35 minutes  Recommendations for Outpatient Follow-up:  1. PCP in 1 week 2. Needs Neurology FU in 1 month and EEG as outpatient 3. Started on Lantus for DM/please monitor CBGs more closely   Discharge Diagnoses:  Principal Problem:   Acute metabolic encephalopathy   Complex partial seizures suspected   Early Dementia   Hypertension   Type II diabetes mellitus with renal manifestations ([place])   H/o Stroke ([place])   CKD (chronic kidney disease), stage III   HLD (hyperlipidemia)   Discharge Condition: stable  Diet recommendation: diabetic  Filed Weights   06/14/17 1700  Weight: 72.6 kg (160 lb)    History of present illness:  Keith Maddoxis a 83 y.o.malewith medical history significant of hypertension, hyperlipidemia, diabetes mellitus, possible stroke, prostate cancer (s/p of surgery and possible chemo per his son), colon cancer (s/p of right colectomy, radiation and chemotherapy), who presents with altered mental status. Per patient's son, patient's memory has been gradually declining in this year, but 8/23 his mental status had acutely worsened. Patient was found tobe disorientated, hence brought to the ER  Hospital Course:  Acute Encephalopathy/Complex partial seizures -initially felt to be related to hyperglycemia and dehydration, but on 8/24 evening he had 2 episodes in the hospital with acute mental status change with acute onset confusion, eyes rolled back and not making eye contact during second episode, which raised the concern for complex partial seizures -MRI without acute findings, old CVA and atrophy -Neurology consulted, seen by Dr.Arora -he was started on Keppra, no further episodes noted -will need EEG as outpatient per Neurology recommendations -no fevers/leukocytosis, UA/ammonia  unremarkable -PT/OT eval completed, SNF recommended, d/w son who agreed, CSW consulted for Rehab  Early Dementia/SHort term memory loss -ongoing for 1-2years per son  Type II diabetes mellitus with renal manifestations and hypoglycemia: -poorly controled, on metformin and Amarylat home - Hba1c is 11.2 suggesting poorly controlled DM - started on lantus since he will be discharging to SNF where he will not be incharge of his medications  HTN: -Continue lisinopril  Hx of stroke ([place]):  -Continue Plavix and Lipitor -MRI without acute findings  HLD: -Lipitor  CKD (chronic kidney disease), stage III: stable. Baseline creatinine 1.2-1.3. His creatinine is 1.32 on admission. -Stable    Consultations:  Neurology  Discharge Exam: Vitals:   06/17/17 1956 06/18/17 0456  BP: 115/67 110/64  Pulse: (!) 57 (!) 50  Resp: 16   Temp: 98.8 F (37.1 C) 98.6 F (37 C)  SpO2: 100% 100%    General: Alert, awake, oriented to self and place but not to time Cardiovascular: S1S2/RRR Respiratory: CTAB  Discharge Instructions   Discharge Instructions    Diet - low sodium heart healthy    Complete by:  As directed    Diet - low sodium heart healthy    Complete by:  As directed    Diet Carb Modified    Complete by:  As directed    Increase activity slowly    Complete by:  As directed    Increase activity slowly    Complete by:  As directed      Current Discharge Medication List    START taking these medications   Details  acetaminophen (TYLENOL) 325 MG tablet Take 2 tablets (650 mg total) by mouth every 6 (six) hours as needed for mild pain  or fever.    insulin glargine (LANTUS) 100 UNIT/ML injection Inject 0.1 mLs (10 Units total) into the skin daily.      CONTINUE these medications which have CHANGED   Details  lisinopril (PRINIVIL,ZESTRIL) 20 MG tablet Take 1 tablet (20 mg total) by mouth daily.    metFORMIN (GLUCOPHAGE) 500 MG tablet Take 1 tablet (500 mg  total) by mouth 2 (two) times daily with a meal. Refills: 0      CONTINUE these medications which have NOT CHANGED   Details  atorvastatin (LIPITOR) 40 MG tablet As directed   Associated Diagnoses: Malignant neoplasm of cecum ([facility])    ferrous sulfate 325 (65 FE) MG tablet Take 325 mg by mouth 2 (two) times daily.   Associated Diagnoses: Colon cancer ([place])    glimepiride (AMARYL) 4 MG tablet Take 4 mg by mouth 2 (two) times daily.     ONE TOUCH ULTRA TEST test strip       STOP taking these medications     HYDROcodone-acetaminophen (NORCO) 5-325 MG per tablet      lidocaine-prilocaine (EMLA) cream        Allergies  Allergen Reactions  . Penicillins Swelling    Has patient had a PCN reaction causing immediate rash, facial/tongue/throat swelling, SOB or lightheadedness with hypotension: Unknown Has patient had a PCN reaction causing severe rash involving mucus membranes or skin necrosis: Unknown Has patient had a PCN reaction that required hospitalization: Unknown Has patient had a PCN reaction occurring within the last 10 years: Unknown If all of the above answers are "NO", then may proceed with Cephalosporin use.  Happened in childhood   Follow-up Information    Charolette Forward, MD. Schedule an appointment as soon as possible for a visit in 1 week(s).   Specialty:  Cardiology Contact information: [place] [address] [phone]            The results of significant diagnostics from this hospitalization (including imaging, microbiology, ancillary and laboratory) are listed below for reference.    Significant Diagnostic Studies: Ct Head Wo Contrast  Result Date: 06/14/2017 CLINICAL DATA:  83 y/o M; unexplained altered level of consciousness. EXAM: CT HEAD WITHOUT CONTRAST TECHNIQUE: Contiguous axial images were obtained from the base of the skull through the vertex without intravenous contrast. COMPARISON:  04/29/2017 CT and MRI  head. FINDINGS: Brain: No evidence of acute infarction, hemorrhage, hydrocephalus, extra-axial collection or mass lesion/mass effect. Small chronic cortical infarct in the right frontal operculum. Stable mild chronic microvascular ischemic changes of the brain and moderate parenchymal volume loss with anterior frontal lobe predominance. Vascular: Extensive calcific atherosclerosis of carotid siphons. No hyperdense vessel identified peer Skull: Normal. Negative for fracture or focal lesion. Sinuses/Orbits: No acute finding. Other: None. IMPRESSION: 1. No acute intracranial abnormality identified. 2. Stable mild chronic microvascular ischemic changes and moderate brain parenchymal volume loss with anterior temporal lobe predominance. Electronically Signed   By: Kristine Garbe M.D.   On: 06/14/2017 18:50   Mr Brain Wo Contrast  Result Date: 06/15/2017 CLINICAL DATA:  Encephalopathy. Prostate cancer. Two episodes of transient confusion and blank stares with possible seizure like activity. EXAM: MRI HEAD WITHOUT CONTRAST TECHNIQUE: Multiplanar, multiecho pulse sequences of the brain and surrounding structures were obtained without intravenous contrast. COMPARISON:  MRI brain 04/29/2008. FINDINGS: Brain: No acute infarct, hemorrhage, or mass lesion is present. The ventricles are of normal size. Moderate diffuse atrophy has progressed. Periventricular white matter changes are noted. There is  focal encephalomalacia involving the right frontal operculum compatible with a remote infarct. Ventricles are proportionate to the degree of atrophy. No significant extra-axial fluid collection is present. Vascular: Normal flow voids. Skull and upper cervical spine: The skullbase is normal. The craniocervical junction is normal. Degenerative changes are noted the upper cervical spine. Sinuses/Orbits: The paranasal sinuses and mastoid air cells are clear. The globes and orbits are within normal limits. IMPRESSION: 1. No  acute intracranial abnormality. 2. Remote right frontal operculum infarct. 3. Progressive advanced atrophy and white matter disease. Electronically Signed   By: San Morelle M.D.   On: 06/15/2017 21:03    Microbiology: No results found for this or any previous visit (from the past 240 hour(s)).   Labs: Basic Metabolic Panel:  Recent Labs Lab 06/14/17 1809 06/14/17 1810 06/15/17 0632 06/17/17 0618  NA 135 134* 139 140  K 5.0 5.0 3.7 3.9  CL 99* 100* 112* 109  CO2  --  24 22 25   GLUCOSE 460* 461* 73 95  BUN 28* 27* 18 15  CREATININE 1.20 1.32* 0.97 1.03  CALCIUM  --  10.0 8.4* 9.2   Liver Function Tests:  Recent Labs Lab 06/14/17 1810  AST 20  ALT 28  ALKPHOS 113  BILITOT 0.7  PROT 7.9  ALBUMIN 4.6   No results for input(s): LIPASE, AMYLASE in the last 168 hours.  Recent Labs Lab 06/14/17 1804  AMMONIA 9   CBC:  Recent Labs Lab 06/14/17 1809 06/14/17 1810 06/15/17 0632 06/17/17 0618  WBC  --  4.8 4.4 3.4*  NEUTROABS  --  3.2  --   --   HGB 13.3 13.2 10.2* 11.0*  HCT 39.0 38.2* 30.2* 31.9*  MCV  --  89.5 89.6 89.4  PLT  --  216 176 190   Cardiac Enzymes: No results for input(s): CKTOTAL, CKMB, CKMBINDEX, TROPONINI in the last 168 hours. BNP: BNP (last 3 results) No results for input(s): BNP in the last 8760 hours.  ProBNP (last 3 results) No results for input(s): PROBNP in the last 8760 hours.  CBG:  Recent Labs Lab 06/17/17 1128 06/17/17 1701 06/17/17 2144 06/18/17 0751 06/18/17 1151  GLUCAP 284* 285* 358* 131* 353*       Signed:  Michaeline Eckersley MD.  [facility] 06/18/2017, 12:06 PM

## 2017-06-18 NOTE — Progress Notes (Signed)
Date: June 18, 2017 Chart reviewed for discharge orders: None found for case management. Vernia Buff, [phone]

## 2017-06-18 NOTE — Clinical Social Work Note (Signed)
Clinical Social Work Assessment  Patient Details  Name: [name] MRN: [id number] Date of Birth: 1931-10-18  Date of referral:  06/18/17               Reason for consult:  Facility Placement                Permission sought to share information with:  Family Supports Permission granted to share information::     Name::        Agency::     Relationship::  Son   Contact Information:     Housing/Transportation Living arrangements for the past 2 months:  Single Family Home Source of Information:  Adult Children Patient Interpreter Needed:  None Criminal Activity/Legal Involvement Pertinent to Current Situation/Hospitalization:  No - Comment as needed Significant Relationships:  Adult Children Lives with:  Adult Children Do you feel safe going back to the place where you live?  Yes Need for family participation in patient care:  Yes (Comment)  Care giving concerns: SNF Placement  8/23 his mental status acutely worsened.    Social Worker assessment / plan: CSW called patient son, explain role and reason for call- to assist with SNF placement per change in patient medical status. Patient son agreeable to SNF. CSW followed up with SNF bed offers. Patient son reports he is out of town and cannot view facilities. Patient son agreeable to send patient to facility in [place] near family.  Patient son is agreeable to [facility]. CSW contacted [place], she confirmed bed.  FL2 completed/ DC summary faxed.    Employment status:    Insurance information:  Medicare PT Recommendations:  [employer] / Referral to community resources:  [place]  Patient/Family's Response to care: Agreeable and Responding well to care. Patient son will complete SNF admissions information by phone.  Patient/Family's Understanding of and Emotional Response to Diagnosis, Current Treatment, and Prognosis: Patient son understands patient needs short term rehab before  returning he is agreeable to SNF placement.   Emotional Assessment Appearance:  Appears stated age Attitude/Demeanor/Rapport:    Affect (typically observed):  Calm Orientation:  Oriented to Self, Oriented to Place Alcohol / Substance use:  Not Applicable Psych involvement (Current and /or in the community):  No (Comment)  Discharge Needs  Concerns to be addressed:  Discharge Planning Concerns Readmission within the last 30 days:  No Current discharge risk:  Dependent with Mobility Barriers to Discharge:  No Barriers Identified   [name], LCSW 06/18/2017, 1:06 PM

## 2017-06-18 NOTE — Progress Notes (Signed)
Physical Therapy Treatment Patient Details Name: [name] MRN: [id number] DOB: Jul 17, 1931 Today's Date: 06/18/2017    History of Present Illness  83 y.o. male with medical history significant of hypertension, hyperlipidemia, diabetes mellitus, possible stroke, prostate cancer (s/p of surgery and possible chemo per his son), colon cancer (s/p of right colectomy, radiation and chemotherapy), who presents with altered mental status.  ? seizure activity in hospital    PT Comments    Pt demonstrates increased cognition.  Assisted with amb in hallway a greater distance with decreased assist level.     Follow Up Recommendations  SNF;Supervision/Assistance - 24 hour ([place])     Equipment Recommendations       Recommendations for Other Services       Precautions / Restrictions Precautions Precautions: Fall Precaution Comments: AMS Restrictions Weight Bearing Restrictions: No    Mobility  Bed Mobility               General bed mobility comments: OOB in recliner  Transfers Overall transfer level: Needs assistance Equipment used: None Transfers: Sit to/from [facility] Sit to Stand: Min guard;Min assist Stand pivot transfers: Min guard;Min assist       General transfer comment: 25% VC's on safety with turns and hand placement with sit to stand and stand to sit  Ambulation/Gait Ambulation/Gait assistance: Min guard;Min assist Ambulation Distance (Feet): 155 Feet Assistive device: 1 person hand held assist Gait Pattern/deviations: Step-through pattern;Narrow base of support Gait velocity: WFL   General Gait Details: pt demonstartes increased dynamic balance and alternating gait with no true LOB.     Stairs            Wheelchair Mobility    Modified Rankin (Stroke Patients Only)       Balance                                            Cognition Arousal/Alertness: Awake/alert Behavior During Therapy: WFL for  tasks assessed/performed Overall Cognitive Status: Within Functional Limits for tasks assessed                                 General Comments: increased awareness and cognition.  Pt admits to not remembering much when admitted.  "I feel good".       Exercises      General Comments        Pertinent Vitals/Pain Pain Assessment: No/denies pain    Home Living                      Prior Function            PT Goals (current goals can now be found in the care plan section) Progress towards PT goals: Progressing toward goals    Frequency    Min 3X/week      PT Plan Discharge plan needs to be updated    Co-evaluation              AM-PAC PT "6 Clicks" Daily Activity  Outcome Measure  Difficulty turning over in bed (including adjusting bedclothes, sheets and blankets)?: A Little Difficulty moving from lying on back to sitting on the side of the bed? : A Little Difficulty sitting down on and standing up from a chair with arms (e.g., wheelchair, bedside commode,  etc,.)?: A Little Help needed moving to and from a bed to chair (including a wheelchair)?: A Little Help needed walking in hospital room?: A Little Help needed climbing 3-5 steps with a railing? : A Lot 6 Click Score: 17    End of Session Equipment Utilized During Treatment: Gait belt Activity Tolerance: Patient tolerated treatment well Patient left: in chair;with call bell/phone within reach;with chair alarm set Nurse Communication: Mobility status PT Visit Diagnosis: Difficulty in walking, not elsewhere classified (R26.2)     Time: 0932-3557 PT Time Calculation (min) (ACUTE ONLY): 12 min  Charges:  $Gait Training: 8-22 mins                    G Codes:       Rica Koyanagi  PTA [facility]  Acute  Rehab Pager      [phone]

## 2017-06-18 NOTE — Care Management Note (Signed)
Case Management Note  Patient Details  Name: Kenneith Stief MRN: [id number] Date of Birth: 09-May-1931  Subjective/Objective:                  Acute encephalopathy  Action/Plan: Date:  June 18, 2017  Chart reviewed for concurrent status and case management needs.  Will continue to follow patient progress.  Discharge Planning: following for needs /SNF placment Expected discharge date: [id number]  Velva Harman, BSN, [place], [place]   Expected Discharge Date:   (unknown)               Expected Discharge Plan:  Home/Self Care  In-House Referral:     Discharge planning Services  CM Consult  Post Acute Care Choice:    Choice offered to:     DME Arranged:    DME Agency:     HH Arranged:    HH Agency:     Status of Service:  In process, will continue to follow  If discussed at Long Length of Stay Meetings, dates discussed:    Additional Comments:  Leeroy Cha, RN 06/18/2017, 9:35 AM

## 2017-06-18 NOTE — Progress Notes (Signed)
Occupational Therapy Treatment Patient Details Name: [name] MRN: [id number] DOB: 1931/08/09 Today's Date: 06/18/2017    History of present illness  83 y.o. male with medical history significant of hypertension, hyperlipidemia, diabetes mellitus, possible stroke, prostate cancer (s/p of surgery and possible chemo per his son), colon cancer (s/p of right colectomy, radiation and chemotherapy), who presents with altered mental status.  ? seizure activity in hospital   OT comments  Pt progressing towards goals, completed room level functional mobility without AD and standing grooming ADLs all with MinA for steadying throughout. Requires Min verbal safety cues during session. Will continue to follow to progress Pt's safety awareness and independence with ADLs and functional mobility completion. POC remains appropriate.    Follow Up Recommendations  SNF;Supervision/Assistance - 24 hour (if home, HHOT )    Equipment Recommendations  Other (comment) (to be further assessed, family may want tub bench )          Precautions / Restrictions Precautions Precautions: Fall Restrictions Weight Bearing Restrictions: No       Mobility Bed Mobility Overal bed mobility: Needs Assistance Bed Mobility: Supine to Sit     Supine to sit: Supervision;HOB elevated        Transfers Overall transfer level: Needs assistance Equipment used: None Transfers: Sit to/from Stand Sit to Stand: Min assist         General transfer comment: MinA to steady upon rising     Balance Overall balance assessment: Needs assistance         Standing balance support: No upper extremity supported;During functional activity Standing balance-Leahy Scale: Fair Standing balance comment: min steadying assistance                           ADL either performed or assessed with clinical judgement   ADL Overall ADL's : Needs assistance/impaired     Grooming: Wash/dry face;Oral care;Minimal  assistance;Standing                       Toileting- Clothing Manipulation and Hygiene: Minimal assistance;Sit to/from stand Toileting - Clothing Manipulation Details (indicate cue type and reason): MinA to steady while Pt stands to void bladder      Functional mobility during ADLs: Minimal assistance General ADL Comments: Pt completed room level functional mobility, standing grooming ADLs at sink; requires MinA to steady in standing, min verbal cues for safety as Pt demonstrates decreased safety awareness; Pt is able to sequence and complete tasks, requires min verbal cues for locating soap/paper towels                       Cognition Arousal/Alertness: Awake/alert Behavior During Therapy: WFL for tasks assessed/performed Overall Cognitive Status: No family/caregiver present to determine baseline cognitive functioning                                 General Comments: decreased short term memory - per chart possibly early dementia, Pt aware that he is in hospital but asked multiple times for clarification on reason why; Pt independently asks OT to change calendar date as it was still displaying yesterday's date; min verbal cues for safety as pt unaware of decreased balance                           Pertinent Vitals/ Pain  Pain Assessment: No/denies pain                                                          Frequency  Min 2X/week        Progress Toward Goals  OT Goals(current goals can now be found in the care plan section)  Progress towards OT goals: Progressing toward goals  Acute Rehab OT Goals Patient Stated Goal: none stated OT Goal Formulation: With patient Time For Goal Achievement: 06/23/17 Potential to Achieve Goals: Good  Plan Discharge plan remains appropriate                     AM-PAC PT "6 Clicks" Daily Activity     Outcome Measure   Help from another person eating meals?: A  Little Help from another person taking care of personal grooming?: A Little Help from another person toileting, which includes using toliet, bedpan, or urinal?: A Little Help from another person bathing (including washing, rinsing, drying)?: A Little Help from another person to put on and taking off regular upper body clothing?: A Little Help from another person to put on and taking off regular lower body clothing?: A Little 6 Click Score: 18    End of Session Equipment Utilized During Treatment: Gait belt  OT Visit Diagnosis: Unsteadiness on feet (R26.81)   Activity Tolerance Patient tolerated treatment well   Patient Left in chair;with call bell/phone within reach;with chair alarm set   Nurse Communication Mobility status        Time: 0973-5329 OT Time Calculation (min): 28 min  Charges: OT General Charges $OT Visit: 1 Procedure OT Treatments $Self Care/Home Management : 23-37 mins  Lou Cal, OT Pager [phone] 06/18/2017    Raymondo Band 06/18/2017, 10:46 AM

## 2017-06-18 NOTE — Clinical Social Work Placement (Signed)
   CLINICAL SOCIAL WORK PLACEMENT  NOTE  Date:  06/18/2017  Patient Details  Name: [name] MRN: [id number] Date of Birth: [birth year]  Clinical Social Work is seeking post-discharge placement for this patient at the [place] level of care (*CSW will initial, date and re-position this form in  chart as items are completed):  Yes   Patient/family provided with [facility] Work Department's list of facilities offering this level of care within the geographic area requested by the patient (or if unable, by the patient's family).  Yes   Patient/family informed of their freedom to choose among providers that offer the needed level of care, that participate in Medicare, Medicaid or managed care program needed by the patient, have an available bed and are willing to accept the patient.  Yes   Patient/family informed of [facility]'s ownership interest in [facility] and [facility], as well as of the fact that they are under no obligation to receive care at these facilities.  PASRR submitted to EDS on       PASRR number received on       Existing PASRR number confirmed on 06/18/17     FL2 transmitted to all facilities in geographic area requested by pt/family on       FL2 transmitted to all facilities within larger geographic area on 06/17/17     Patient informed that his/her managed care company has contracts with or will negotiate with certain facilities, including the following:  [facility]     No   Patient/family informed of bed offers received.  Patient chooses bed at [facility]     Physician recommends and patient chooses bed at      Patient to be transferred to [facility] on 06/18/17.  Patient to be transferred to facility by [facility]      Patient family notified on 06/18/17 of transfer.  Name of family member notified:  Son:     PHYSICIAN       Additional Comment:     _______________________________________________ [name], LCSW 06/18/2017, 1:08 PM

## 2017-06-18 NOTE — Care Management Important Message (Signed)
Important Message  Patient Details  Name: Tyren Dugar MRN: [id number] Date of Birth: 1931/06/04   Medicare Important Message Given:  Yes    Kerin Salen 06/18/2017, 10:55 AMImportant Message  Patient Details  Name: Mehkai Gallo MRN: [id number] Date of Birth: Jul 01, 1931   Medicare Important Message Given:  Yes    Kerin Salen 06/18/2017, 10:55 AM

## 2017-06-18 NOTE — Progress Notes (Signed)
Pt discharged to [facility] and son notified of transfer via [place]. All belongings sent with patient.

## 2017-06-19 ENCOUNTER — Non-Acute Institutional Stay (SKILLED_NURSING_FACILITY): Payer: Medicare Other | Admitting: Internal Medicine

## 2017-06-19 ENCOUNTER — Encounter: Payer: Self-pay | Admitting: Internal Medicine

## 2017-06-19 DIAGNOSIS — I10 Essential (primary) hypertension: Secondary | ICD-10-CM | POA: Diagnosis not present

## 2017-06-19 DIAGNOSIS — G40209 Localization-related (focal) (partial) symptomatic epilepsy and epileptic syndromes with complex partial seizures, not intractable, without status epilepticus: Secondary | ICD-10-CM

## 2017-06-19 DIAGNOSIS — D508 Other iron deficiency anemias: Secondary | ICD-10-CM

## 2017-06-19 DIAGNOSIS — G301 Alzheimer's disease with late onset: Secondary | ICD-10-CM | POA: Diagnosis not present

## 2017-06-19 DIAGNOSIS — E1122 Type 2 diabetes mellitus with diabetic chronic kidney disease: Secondary | ICD-10-CM

## 2017-06-19 DIAGNOSIS — E785 Hyperlipidemia, unspecified: Secondary | ICD-10-CM

## 2017-06-19 DIAGNOSIS — F0280 Dementia in other diseases classified elsewhere without behavioral disturbance: Secondary | ICD-10-CM

## 2017-06-19 DIAGNOSIS — G9341 Metabolic encephalopathy: Secondary | ICD-10-CM

## 2017-06-19 DIAGNOSIS — Z8673 Personal history of transient ischemic attack (TIA), and cerebral infarction without residual deficits: Secondary | ICD-10-CM

## 2017-06-19 DIAGNOSIS — N183 Chronic kidney disease, stage 3 unspecified: Secondary | ICD-10-CM

## 2017-06-19 NOTE — Progress Notes (Signed)
: Provider:  Noah Delaine. Sheppard Coil, MD Location:  [place] Room Number: [place]:  SNF ([phone])  PCP: Charolette Forward, MD Patient Care Team: Charolette Forward, MD as PCP - General (Internal Medicine)  Extended Emergency Contact Information Primary Emergency Contact: Harb,Ronald Address: [place], [place] [address] [address] [place] Phone: [phone] Relation: Son Secondary Emergency Contact: Ruperto,Reginald  [place] Mobile Phone: [phone] Relation: Son     Allergies: Penicillins  Chief Complaint  Patient presents with  . New Admit To SNF    following hospitalization 06/14/17 to 0/86/57 acute metabolic encephalopathy.     HPI: Patient is 83 y.o. male hypertension, hyperlipidemia, diabetes, possible stroke, prostate cancer (status post surgery and chemotherapy), colon cancer (status post right colectomy, radiation, chemotherapy and (who presented to [facility] emergency department with altered mental status. Historian is patient's son. Patient's memory has been declining over the last year but worse today. Patient was found to be disoriented and urinating everywhere. Patient has generalized weakness but no unilateral weakness or numbness or tingling no facial droop and slurred speech no hearing loss or vision change no pain anywhere no chest pain or shortness of breath or cough no nausea vomiting or diarrhea no abdominal pain.initial workup in the ED was negative except for blood sugar of 461. Patient was admitted to [facility] from 8/23-27 for acute metabolic encephalopathy initially felt to be related to hyperglycemia and dehydration, Which resolved. Hospital course was complicated by the acute onset of confusion with eyes rolling back and head making no eye contact during episode which raise concern for complex partial seizures. MRI was without acute findings but old CVA and atrophy,  neurology consulted and patient was started on Keppra with no further episodes. Patient is admitted to skilled nursing facility with jaws weakness for OT/PT. While at skilled nursing facility patient will be followed for hypertension treated with Zestril, hyperlipidemia treated with Lipitor and iron deficiency anemia treated with iron.  Past Medical History:  Diagnosis Date  . Acute metabolic encephalopathy 8/46/9629  . CKD (chronic kidney disease), stage III 06/14/2017  . Colon cancer ([place])    colon ca dx 7/11  . Diabetes mellitus   . Hypertension   . Malignant neoplasm of cecum ([place]) 08/27/2011  . MI (myocardial infarction) ([place])   . Prostate CA [facility])    prostate  . Stroke ([place])   . Type II diabetes mellitus with renal manifestations ([place]) 06/05/2012    Past Surgical History:  Procedure Laterality Date  . COLON SURGERY  06/2010   right colectomy, T3N2  . PORT-A-CATH REMOVAL N/A 10/19/2014   Procedure: MINOR REMOVAL PORT-A-CATH;  Surgeon: Autumn Messing III, MD;  Location: [facility];  Service: General;  Laterality: N/A;  . PROSTATE SURGERY      Allergies as of 06/19/2017      Reactions   Penicillins Swelling   Has patient had a PCN reaction causing immediate rash, facial/tongue/throat swelling, SOB or lightheadedness with hypotension: Unknown Has patient had a PCN reaction causing severe rash involving mucus membranes or skin necrosis: Unknown Has patient had a PCN reaction that required hospitalization: Unknown Has patient had a PCN reaction occurring within the last 10 years: Unknown If all of the above answers are "NO", then may proceed with Cephalosporin use. Happened in childhood      Medication List  Accurate as of 06/19/17 10:03 AM. Always use your most recent med list.          acetaminophen 325 MG tablet Commonly known as:  TYLENOL Take 2 tablets (650 mg total) by mouth every 6 (six) hours as needed for mild pain or fever.   atorvastatin 40 MG  tablet Commonly known as:  LIPITOR As directed   ferrous sulfate 325 (65 FE) MG tablet Take 325 mg by mouth 2 (two) times daily.   glimepiride 4 MG tablet Commonly known as:  AMARYL Take 4 mg by mouth 2 (two) times daily.   insulin glargine 100 UNIT/ML injection Commonly known as:  LANTUS Inject 0.1 mLs (10 Units total) into the skin daily.   lisinopril 20 MG tablet Commonly known as:  PRINIVIL,ZESTRIL Take 1 tablet (20 mg total) by mouth daily.   metFORMIN 500 MG tablet Commonly known as:  GLUCOPHAGE Take 1 tablet (500 mg total) by mouth 2 (two) times daily with a meal.       No orders of the defined types were placed in this encounter.   Immunization History  Administered Date(s) Administered  . PPD Test 06/18/2017    Social History  Substance Use Topics  . Smoking status: Former Smoker    Packs/day: 0.25    Years: 3.00    Types: Cigarettes  . Smokeless tobacco: Never Used  . Alcohol use No    Family history is   Family History  Problem Relation Age of Onset  . Diabetes Mellitus II Mother       Review of Systems  DATA OBTAINED: from patient, nurse GENERAL:  no fevers, fatigue, appetite changes SKIN: No itching, or rash EYES: No eye pain, redness, discharge EARS: No earache, tinnitus, change in hearing NOSE: No congestion, drainage or bleeding  MOUTH/THROAT: No mouth or tooth pain, No sore throat RESPIRATORY: No cough, wheezing, SOB CARDIAC: No chest pain, palpitations, lower extremity edema  GI: No abdominal pain, No N/V/D or constipation, No heartburn or reflux  GU: No dysuria, frequency or urgency, or incontinence  MUSCULOSKELETAL: No unrelieved bone/joint pain NEUROLOGIC: No headache, dizziness or focal weakness PSYCHIATRIC: No c/o anxiety or sadness   Vitals:   06/19/17 0951  BP: 100/66  Pulse: 76  Resp: (!) 24  Temp: (!) 97 F (36.1 C)  SpO2: 96%    SpO2 Readings from Last 1 Encounters:  06/19/17 96%   Body mass index is 23.63  kg/m.     Physical Exam  GENERAL APPEARANCE: Alert, No acute distress.  SKIN: No diaphoresis rash HEAD: Normocephalic, atraumatic  EYES: Conjunctiva/lids clear. Pupils round, reactive. EOMs intact.  EARS: External exam WNL, canals clear. Hearing grossly normal.  NOSE: No deformity or discharge.  MOUTH/THROAT: Lips w/o lesions  RESPIRATORY: Breathing is even, unlabored. Lung sounds are clear   CARDIOVASCULAR: Heart RRR no murmurs, rubs or gallops. No peripheral edema.   GASTROINTESTINAL: Abdomen is soft, non-tender, not distended w/ normal bowel sounds. GENITOURINARY: Bladder non tender, not distended  MUSCULOSKELETAL: No abnormal joints or musculature NEUROLOGIC:  Cranial nerves 2-12 grossly intact. Moves all extremities  PSYCHIATRIC: Mood and affect with mild dementia, no behavioral issues  Patient Active Problem List   Diagnosis Date Noted  . Acute metabolic encephalopathy 47/42/5956  . CKD (chronic kidney disease), stage III 06/14/2017  . HLD (hyperlipidemia) 06/14/2017  . Stroke ([place])   . Hypertension 06/05/2012  . Type II diabetes mellitus with renal manifestations ([place]) 06/05/2012  . Malignant neoplasm of cecum ([place]) 08/27/2011  Labs reviewed: Basic Metabolic Panel:    Component Value Date/Time   NA 140 06/17/2017 0618   NA 139 08/31/2015 0931   K 3.9 06/17/2017 0618   K 4.8 08/31/2015 0931   CL 109 06/17/2017 0618   CL 103 12/03/2012 1022   CO2 25 06/17/2017 0618   CO2 25 08/31/2015 0931   GLUCOSE 95 06/17/2017 0618   GLUCOSE 155 (H) 08/31/2015 0931   GLUCOSE 225 (H) 12/03/2012 1022   BUN 15 06/17/2017 0618   BUN 17.1 08/31/2015 0931   CREATININE 1.03 06/17/2017 0618   CREATININE 1.2 08/31/2015 0931   CALCIUM 9.2 06/17/2017 0618   CALCIUM 10.6 (H) 08/31/2015 0931   PROT 7.9 06/14/2017 1810   PROT 7.4 08/31/2015 0931   ALBUMIN 4.6 06/14/2017 1810   ALBUMIN 4.2 08/31/2015 0931   AST 20 06/14/2017 1810   AST 30 08/31/2015 0931   ALT 28 06/14/2017  1810   ALT 57 (H) 08/31/2015 0931   ALKPHOS 113 06/14/2017 1810   ALKPHOS 143 08/31/2015 0931   BILITOT 0.7 06/14/2017 1810   BILITOT 0.47 08/31/2015 0931   GFRNONAA >60 06/17/2017 0618   GFRAA >60 06/17/2017 0618     Recent Labs  06/14/17 1810 06/15/17 0632 06/17/17 0618  NA 134* 139 140  K 5.0 3.7 3.9  CL 100* 112* 109  CO2 24 22 25   GLUCOSE 461* 73 95  BUN 27* 18 15  CREATININE 1.32* 0.97 1.03  CALCIUM 10.0 8.4* 9.2   Liver Function Tests:  Recent Labs  06/14/17 1810  AST 20  ALT 28  ALKPHOS 113  BILITOT 0.7  PROT 7.9  ALBUMIN 4.6   No results for input(s): LIPASE, AMYLASE in the last 8760 hours.  Recent Labs  06/14/17 1804  AMMONIA 9   CBC:  Recent Labs  06/14/17 1810 06/15/17 0632 06/17/17 0618  WBC 4.8 4.4 3.4*  NEUTROABS 3.2  --   --   HGB 13.2 10.2* 11.0*  HCT 38.2* 30.2* 31.9*  MCV 89.5 89.6 89.4  PLT 216 176 190   Lipid No results for input(s): CHOL, HDL, LDLCALC, TRIG in the last 8760 hours.  Cardiac Enzymes: No results for input(s): CKTOTAL, CKMB, CKMBINDEX, TROPONINI in the last 8760 hours. BNP: No results for input(s): BNP in the last 8760 hours. No results found for: [facility] Lab Results  Component Value Date   HGBA1C 11.2 (H) 06/15/2017   Lab Results  Component Value Date   TSH 3.151 06/16/2017   No results found for: VITAMINB12 No results found for: FOLATE No results found for: IRON, TIBC, FERRITIN  Imaging and Procedures obtained prior to SNF admission: Ct Head Wo Contrast  Result Date: 06/14/2017 CLINICAL DATA:  83 y/o M; unexplained altered level of consciousness. EXAM: CT HEAD WITHOUT CONTRAST TECHNIQUE: Contiguous axial images were obtained from the base of the skull through the vertex without intravenous contrast. COMPARISON:  04/29/2017 CT and MRI head. FINDINGS: Brain: No evidence of acute infarction, hemorrhage, hydrocephalus, extra-axial collection or mass lesion/mass effect. Small chronic cortical infarct  in the right frontal operculum. Stable mild chronic microvascular ischemic changes of the brain and moderate parenchymal volume loss with anterior frontal lobe predominance. Vascular: Extensive calcific atherosclerosis of carotid siphons. No hyperdense vessel identified peer Skull: Normal. Negative for fracture or focal lesion. Sinuses/Orbits: No acute finding. Other: None. IMPRESSION: 1. No acute intracranial abnormality identified. 2. Stable mild chronic microvascular ischemic changes and moderate brain parenchymal volume loss with anterior temporal lobe predominance. Electronically Signed  By: Kristine Garbe M.D.   On: 06/14/2017 18:50   Mr Brain Wo Contrast  Result Date: 06/15/2017 CLINICAL DATA:  Encephalopathy. Prostate cancer. Two episodes of transient confusion and blank stares with possible seizure like activity. EXAM: MRI HEAD WITHOUT CONTRAST TECHNIQUE: Multiplanar, multiecho pulse sequences of the brain and surrounding structures were obtained without intravenous contrast. COMPARISON:  MRI brain 04/29/2008. FINDINGS: Brain: No acute infarct, hemorrhage, or mass lesion is present. The ventricles are of normal size. Moderate diffuse atrophy has progressed. Periventricular white matter changes are noted. There is focal encephalomalacia involving the right frontal operculum compatible with a remote infarct. Ventricles are proportionate to the degree of atrophy. No significant extra-axial fluid collection is present. Vascular: Normal flow voids. Skull and upper cervical spine: The skullbase is normal. The craniocervical junction is normal. Degenerative changes are noted the upper cervical spine. Sinuses/Orbits: The paranasal sinuses and mastoid air cells are clear. The globes and orbits are within normal limits. IMPRESSION: 1. No acute intracranial abnormality. 2. Remote right frontal operculum infarct. 3. Progressive advanced atrophy and white matter disease. Electronically Signed   By:  San Morelle M.D.   On: 06/15/2017 21:03     Not all labs, radiology exams or other studies done during hospitalization come through on my EPIC note; however they are reviewed by me.    Assessment and Plan  ACUTE ENCEPHALOPATHY/COMPLEX PARTIAL SEIZURES-initially felt to be related to hyperglycemia and dehydration but on 8/24 patient had 2 episodes of acute mental status change with acute onset of confusion eyes rolling back and head unresponsiveness which raise concern for complex partial seizures; MRI without acute findings except for old CVA and atrophy; neurology consulted and patient was started on Keppra with no further episodes noted; will need EEG as outpatient per neurology recommendations; no fevers no leukocytosis, UA/ammonia unremarkable SNF - admitted with generalized weakness for OT/PT; will verify the patient is me on Keppra not, Was not on the discharge list of meds  Dementia/1-2 years in duration per son SNF - will undergo local at skilled nursing per speech therapy  DM 2 WITH RENAL MANIFESTATIONS/ HYPERGLYCEMIA-hemoglobin A1c was 11.2, poorly controlled on metformin and Amaryl at home; started on Lantus SNF -continue Lantus 10 units in the skin daily along with Glucophage 500 mg twice a day and Amaryl 4 mg twice a day; pt on ACE and statin  HYPERTENSION SNF - controlled continue lisinopril 20 mg by mouth daily  HYPERLIPIDEMIA SNF - not stated as uncontrolled;cont lipitor 40 mg daily  HX OF STROKE- MRI without acute SNF - will need to find out if pt is supposed on plavix; cont risk factor control with lipitor, DM meds and lisonopril  CKD3- pt at baseline of Cr 1.3 SNF - will f/u BMP  IRON DEF ANEMIA- d/c Hb 11.0 SNF - f/u BMP   Time spent > 45 min;> 50% of time with patient was spent reviewing records, labs, tests and studies, counseling and developing plan of care  Webb Silversmith D. Sheppard Coil, MD

## 2017-06-22 LAB — BASIC METABOLIC PANEL
BUN: 22 — AB (ref 4–21)
CREATININE: 1 (ref 0.6–1.3)
Glucose: 169
POTASSIUM: 4.4 (ref 3.4–5.3)
Sodium: 138 (ref 137–147)

## 2017-06-22 LAB — CBC AND DIFFERENTIAL
HCT: 34 — AB (ref 41–53)
HEMOGLOBIN: 11.3 — AB (ref 13.5–17.5)
PLATELETS: 202 (ref 150–399)
WBC: 4

## 2017-06-26 DIAGNOSIS — G40209 Localization-related (focal) (partial) symptomatic epilepsy and epileptic syndromes with complex partial seizures, not intractable, without status epilepticus: Secondary | ICD-10-CM | POA: Insufficient documentation

## 2017-06-26 DIAGNOSIS — F0390 Unspecified dementia without behavioral disturbance: Secondary | ICD-10-CM | POA: Insufficient documentation

## 2017-06-26 DIAGNOSIS — Z8673 Personal history of transient ischemic attack (TIA), and cerebral infarction without residual deficits: Secondary | ICD-10-CM

## 2017-06-26 DIAGNOSIS — D509 Iron deficiency anemia, unspecified: Secondary | ICD-10-CM | POA: Insufficient documentation

## 2017-06-26 HISTORY — DX: Localization-related (focal) (partial) symptomatic epilepsy and epileptic syndromes with complex partial seizures, not intractable, without status epilepticus: G40.209

## 2017-06-26 HISTORY — DX: Personal history of transient ischemic attack (TIA), and cerebral infarction without residual deficits: Z86.73

## 2017-06-26 HISTORY — DX: Unspecified dementia, unspecified severity, without behavioral disturbance, psychotic disturbance, mood disturbance, and anxiety: F03.90

## 2017-06-26 HISTORY — DX: Iron deficiency anemia, unspecified: D50.9

## 2017-07-03 ENCOUNTER — Other Ambulatory Visit: Payer: Self-pay

## 2017-07-09 ENCOUNTER — Non-Acute Institutional Stay (SKILLED_NURSING_FACILITY): Payer: Medicare Other | Admitting: Internal Medicine

## 2017-07-09 ENCOUNTER — Encounter: Payer: Self-pay | Admitting: Internal Medicine

## 2017-07-09 DIAGNOSIS — E1165 Type 2 diabetes mellitus with hyperglycemia: Secondary | ICD-10-CM

## 2017-07-09 NOTE — Progress Notes (Signed)
Location:  [place] Room Number: [place]:  SNF [phone])  Provider: Noah Delaine. Sheppard Coil, MD  Charolette Forward, MD  Patient Care Team: Charolette Forward, MD as PCP - General (Internal Medicine)  Extended Emergency Contact Information Primary Emergency Contact: Thursby,Ronald Address: [place], [place] [address] [address] [place] Phone: [phone] Relation: Son Secondary Emergency Contact: Hoeppner,Reginald  [place] Mobile Phone: [phone] Relation: Son    Allergies: Penicillins  Chief Complaint  Patient presents with  . Acute Visit    [place]age insulin regimen    HPI: Patient is 83 y.o. male who nursing asked me to see get patient on a scheduled mealtime insulin regimen. Patient will be going home soon and the sliding scale insulin will be difficult for him at home.also blood sugars have been running a little high. Patient has not had any symptoms.  Past Medical History:  Diagnosis Date  . Acute metabolic encephalopathy 06/23/5175  . CKD (chronic kidney disease), stage III ([place]) 06/14/2017  . Colon cancer ([place])    colon ca dx 7/11  . Dementia without behavioral disturbance 06/26/2017  . Diabetes mellitus   . History of stroke 06/26/2017  . Hypertension   . Iron deficiency anemia 06/26/2017  . Malignant neoplasm of cecum ([place]) 08/27/2011  . MI (myocardial infarction) ([place])   . 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 Prostate CA [facility])    prostate  . Stroke ([place])   . Type II diabetes mellitus with renal manifestations ([place]) 06/05/2012    Past Surgical History:  Procedure Laterality Date  . COLON SURGERY  06/2010   right colectomy, T3N2  . PROSTATE SURGERY      Allergies as of 07/09/2017      Reactions   Penicillins Swelling   Has patient had a PCN reaction causing immediate rash, facial/tongue/throat swelling, SOB or lightheadedness with hypotension:  Unknown Has patient had a PCN reaction causing severe rash involving mucus membranes or skin necrosis: Unknown Has patient had a PCN reaction that required hospitalization: Unknown Has patient had a PCN reaction occurring within the last 10 years: Unknown If all of the above answers are "NO", then may proceed with Cephalosporin use. Happened in childhood      Medication List        Accurate as of 07/09/17 11:59 PM. Always use your most recent med list.          acetaminophen 325 MG tablet Commonly known as:  TYLENOL Take 2 tablets (650 mg total) by mouth every 6 (six) hours as needed for mild pain or fever.   atorvastatin 40 MG tablet Commonly known as:  LIPITOR As directed   ferrous sulfate 325 (65 FE) MG tablet Take 325 mg by mouth 2 (two) times daily.   glimepiride 4 MG tablet Commonly known as:  AMARYL Take 4 mg by mouth 2 (two) times daily.   insulin glargine 100 UNIT/ML injection Commonly known as:  LANTUS Inject 0.1 mLs (10 Units total) into the skin daily.   lisinopril 20 MG tablet Commonly known as:  PRINIVIL,ZESTRIL Take 1 tablet (20 mg total) by mouth daily.   metFORMIN 500 MG tablet Commonly known as:  GLUCOPHAGE Take 1 tablet (500 mg total) by mouth 2 (two) times daily with a meal.       No orders of the defined types were placed in this encounter.  Immunization History  Administered Date(s) Administered  . PPD Test 06/18/2017    Social History   Tobacco Use  . Smoking status: Former Smoker    Packs/day: 0.25    Years: 3.00    Pack years: 0.75    Types: Cigarettes  . Smokeless tobacco: Never Used  Substance Use Topics  . Alcohol use: No    Review of Systems  DATA OBTAINED: from patient, nurse GENERAL:  no fevers, fatigue, appetite changes SKIN: No itching, rash HEENT: No complaint RESPIRATORY: No cough, wheezing, SOB CARDIAC: No chest pain, palpitations, lower extremity edema  GI: No abdominal pain, No N/V/D or constipation, No  heartburn or reflux  GU: No dysuria, frequency or urgency, or incontinence  MUSCULOSKELETAL: No unrelieved bone/joint pain NEUROLOGIC: No headache, dizziness  PSYCHIATRIC: No overt anxiety or sadness  Vitals:   07/09/17 1300  BP: 99/65  Pulse: 78  Resp: 18  Temp: (!) 97 F (36.1 C)   Body mass index is 22.98 kg/m. Physical Exam  GENERAL APPEARANCE: Alert, conversant, No acute distress  SKIN: No diaphoresis rash HEENT: Unremarkable RESPIRATORY: Breathing is even, unlabored. Lung sounds are clear   CARDIOVASCULAR: Heart RRR no murmurs, rubs or gallops. No peripheral edema  GASTROINTESTINAL: Abdomen is soft, non-tender, not distended w/ normal bowel sounds.  GENITOURINARY: Bladder non tender, not distended  MUSCULOSKELETAL: No abnormal joints or musculature NEUROLOGIC: Cranial nerves 2-12 grossly intact. Moves all extremities PSYCHIATRIC: Mood and affect appropriate to situation with mild dementia, no behavioral issues  Patient Active Problem List   Diagnosis Date Noted  . 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 Dementia without behavioral disturbance 06/26/2017  . History of stroke 06/26/2017  . Iron deficiency anemia 06/26/2017  . Acute metabolic encephalopathy 74/09/8785  . CKD (chronic kidney disease), stage III ([place]) 06/14/2017  . HLD (hyperlipidemia) 06/14/2017  . Stroke ([place])   . Hypertension 06/05/2012  . Type II diabetes mellitus with renal manifestations ([place]) 06/05/2012  . Malignant neoplasm of cecum ([facility]) 08/27/2011    CMP     Component Value Date/Time   NA 138 06/22/2017   NA 139 08/31/2015 0931   K 4.4 06/22/2017   K 4.8 08/31/2015 0931   CL 109 06/17/2017 0618   CL 103 12/03/2012 1022   CO2 25 06/17/2017 0618   CO2 25 08/31/2015 0931   GLUCOSE 95 06/17/2017 0618   GLUCOSE 155 (H) 08/31/2015 0931   GLUCOSE 225 (H) 12/03/2012 1022   BUN 22 (A) 06/22/2017   BUN 17.1 08/31/2015 0931   CREATININE 1.0  06/22/2017   CREATININE 1.03 06/17/2017 0618   CREATININE 1.2 08/31/2015 0931   CALCIUM 9.2 06/17/2017 0618   CALCIUM 10.6 (H) 08/31/2015 0931   PROT 7.9 06/14/2017 1810   PROT 7.4 08/31/2015 0931   ALBUMIN 4.6 06/14/2017 1810   ALBUMIN 4.2 08/31/2015 0931   AST 20 06/14/2017 1810   AST 30 08/31/2015 0931   ALT 28 06/14/2017 1810   ALT 57 (H) 08/31/2015 0931   ALKPHOS 113 06/14/2017 1810   ALKPHOS 143 08/31/2015 0931   BILITOT 0.7 06/14/2017 1810   BILITOT 0.47 08/31/2015 0931   GFRNONAA >60 06/17/2017 0618   GFRAA >60 06/17/2017 0618   Recent Labs    06/14/17 1810 06/15/17 0632 06/17/17 0618 06/22/17  NA 134* 139 140 138  K 5.0 3.7 3.9 4.4  CL 100* 112* 109  --   CO2 24 22 25   --   GLUCOSE 461* 73 95  --  BUN 27* 18 15 22*  CREATININE 1.32* 0.97 1.03 1.0  CALCIUM 10.0 8.4* 9.2  --    Recent Labs    06/14/17 1810  AST 20  ALT 28  ALKPHOS 113  BILITOT 0.7  PROT 7.9  ALBUMIN 4.6   Recent Labs    06/14/17 1810 06/15/17 0632 06/17/17 0618 06/22/17  WBC 4.8 4.4 3.4* 4.0  NEUTROABS 3.2  --   --   --   HGB 13.2 10.2* 11.0* 11.3*  HCT 38.2* 30.2* 31.9* 34*  MCV 89.5 89.6 89.4  --   PLT 216 176 190 202   No results for input(s): CHOL, LDLCALC, TRIG in the last 8760 hours.  Invalid input(s): HCL No results found for: MICROALBUR Lab Results  Component Value Date   TSH 3.151 06/16/2017   Lab Results  Component Value Date   HGBA1C 11.2 (H) 06/15/2017   No results found for: CHOL, HDL, LDLCALC, LDLDIRECT, TRIG, CHOLHDL  Significant Diagnostic Results in last 30 days:  No results found.  Assessment and Plan   DIABETES MELLITUS 2, uncontrolled-allergic to all patient's blood sugars and movement of insulin he is been given with meals since his arrival to the skilled nursing facility; patient's fasting blood sugars are good, he has rarely used insulin in the morning meal 2 or 3 units of most; do not feel like I can increase Lantus 10 units subcutaneous daily;  will increase Glucophage from 500 mg twice a day to 1000 mg twice a day; continue glipizide 4 mg daily; insulin 3 units with lunch and 5 units at dinner; if any meal blood sugars greater than 350 use insulin 10 units total for that meal; will continue to monitor until discharge    Paris Hohn D. Sheppard Coil, MD

## 2017-07-12 ENCOUNTER — Non-Acute Institutional Stay (SKILLED_NURSING_FACILITY): Payer: Medicare Other | Admitting: Internal Medicine

## 2017-07-12 DIAGNOSIS — G40209 Localization-related (focal) (partial) symptomatic epilepsy and epileptic syndromes with complex partial seizures, not intractable, without status epilepticus: Secondary | ICD-10-CM | POA: Diagnosis not present

## 2017-07-12 DIAGNOSIS — D508 Other iron deficiency anemias: Secondary | ICD-10-CM | POA: Diagnosis not present

## 2017-07-12 DIAGNOSIS — F0280 Dementia in other diseases classified elsewhere without behavioral disturbance: Secondary | ICD-10-CM | POA: Diagnosis not present

## 2017-07-12 DIAGNOSIS — G9341 Metabolic encephalopathy: Secondary | ICD-10-CM | POA: Diagnosis not present

## 2017-07-12 DIAGNOSIS — G301 Alzheimer's disease with late onset: Secondary | ICD-10-CM

## 2017-07-12 DIAGNOSIS — E785 Hyperlipidemia, unspecified: Secondary | ICD-10-CM

## 2017-07-12 DIAGNOSIS — E1122 Type 2 diabetes mellitus with diabetic chronic kidney disease: Secondary | ICD-10-CM

## 2017-07-12 DIAGNOSIS — I10 Essential (primary) hypertension: Secondary | ICD-10-CM

## 2017-07-12 DIAGNOSIS — Z8673 Personal history of transient ischemic attack (TIA), and cerebral infarction without residual deficits: Secondary | ICD-10-CM | POA: Diagnosis not present

## 2017-07-12 DIAGNOSIS — N183 Chronic kidney disease, stage 3 unspecified: Secondary | ICD-10-CM

## 2017-08-10 ENCOUNTER — Encounter: Payer: Self-pay | Admitting: Internal Medicine

## 2017-08-10 NOTE — Progress Notes (Signed)
Location:  [facility] Room Number: [place]:  SNF [phone])  Provider: Noah Delaine. Sheppard Coil, MD  PCP: Charolette Forward, MD Patient Care Team: Charolette Forward, MD as PCP - General (Internal Medicine)  Extended Emergency Contact Information Primary Emergency Contact: Kerwin,Ronald Address: [place], [place] [address] [address] [place] Phone: [phone] Relation: Son Secondary Emergency Contact: Najarro,Reginald  [place] Mobile Phone: [phone] Relation: Son  Allergies  Allergen Reactions  . Penicillins Swelling    Has patient had a PCN reaction causing immediate rash, facial/tongue/throat swelling, SOB or lightheadedness with hypotension: Unknown Has patient had a PCN reaction causing severe rash involving mucus membranes or skin necrosis: Unknown Has patient had a PCN reaction that required hospitalization: Unknown Has patient had a PCN reaction occurring within the last 10 years: Unknown If all of the above answers are "NO", then may proceed with Cephalosporin use.  Happened in childhood    Chief Complaint  Patient presents with  . Discharge Note    discharge from SNF to home 07/12/17    HPI:  83 y.o. male      Past Medical History:  Diagnosis Date  . Acute metabolic encephalopathy 6/76/1950  . CKD (chronic kidney disease), stage III ([facility]) 06/14/2017  . Colon cancer ([facility])    colon ca dx 7/11  . Dementia without behavioral disturbance 06/26/2017  . Diabetes mellitus   . History of stroke 06/26/2017  . Hypertension   . Iron deficiency anemia 06/26/2017  . Malignant neoplasm of cecum ([place]) 08/27/2011  . MI (myocardial infarction) ([place])   . 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 Prostate CA [facility])    prostate  . Stroke ([place])   . Type II diabetes mellitus with renal manifestations ([place]) 06/05/2012    Past Surgical History:  Procedure Laterality  Date  . COLON SURGERY  06/2010   right colectomy, T3N2  . PORT-A-CATH REMOVAL N/A 10/19/2014   Procedure: MINOR REMOVAL PORT-A-CATH;  Surgeon: Autumn Messing III, MD;  Location: [facility];  Service: General;  Laterality: N/A;  . PROSTATE SURGERY       reports that he has quit smoking. His smoking use included Cigarettes. He has a 0.75 pack-year smoking history. He has never used smokeless tobacco. He reports that he does not drink alcohol or use drugs. Social History   Social History  . Marital status: Divorced    Spouse name: N/A  . Number of children: N/A  . Years of education: N/A   Occupational History  . Not on file.   Social History Main Topics  . Smoking status: Former Smoker    Packs/day: 0.25    Years: 3.00    Types: Cigarettes  . Smokeless tobacco: Never Used  . Alcohol use No  . Drug use: No  . Sexual activity: No   Other Topics Concern  . Not on file   Social History Narrative   Admitted to [facility] 06/18/17   Divorced   Former smoker   Alcohol none   Full Code       Pertinent  Health Maintenance Due  Topic Date Due  . FOOT EXAM  04/24/1943  . OPHTHALMOLOGY EXAM  04/24/1943  . PNA vac Low Risk Adult (1 of 2 - PCV13) 04/23/1998  . INFLUENZA VACCINE  05/23/2017  . HEMOGLOBIN A1C  12/16/2017  Medications: Allergies as of 08/10/2017      Reactions   Penicillins Swelling   Has patient had a PCN reaction causing immediate rash, facial/tongue/throat swelling, SOB or lightheadedness with hypotension: Unknown Has patient had a PCN reaction causing severe rash involving mucus membranes or skin necrosis: Unknown Has patient had a PCN reaction that required hospitalization: Unknown Has patient had a PCN reaction occurring within the last 10 years: Unknown If all of the above answers are "NO", then may proceed with Cephalosporin use. Happened in childhood      Medication List       Accurate as of 08/10/17  2:34 PM. Always  use your most recent med list.          acetaminophen 325 MG tablet Commonly known as:  TYLENOL Take 2 tablets (650 mg total) by mouth every 6 (six) hours as needed for mild pain or fever.   atorvastatin 40 MG tablet Commonly known as:  LIPITOR As directed   ferrous sulfate 325 (65 FE) MG tablet Take 325 mg by mouth 2 (two) times daily.   glimepiride 4 MG tablet Commonly known as:  AMARYL Take 4 mg by mouth 2 (two) times daily.   HUMALOG KWIKPEN 100 UNIT/ML KiwkPen Generic drug:  insulin lispro Inject into the skin. 10 units before meals if CBG greater than 350; Give 3 units with lunch, if CBG greater than 350 give additional 7 units. Give 5 units with dinner, if CBG is great than 350 give 5 additional units to equal 10 units.   insulin glargine 100 UNIT/ML injection Commonly known as:  LANTUS Inject 0.1 mLs (10 Units total) into the skin daily.   lisinopril 20 MG tablet Commonly known as:  PRINIVIL,ZESTRIL Take 1 tablet (20 mg total) by mouth daily.   metFORMIN 1000 MG tablet Commonly known as:  GLUCOPHAGE Take 1,000 mg by mouth 2 (two) times daily with a meal.        Vitals:   07/12/17 1430  BP: 102/64  Pulse: 64  Resp: 18  Temp: 98.4 F (36.9 C)  SpO2: 97%  Weight: 155 lb (70.3 kg)  Height: 5\' 9"  (1.753 m)   Body mass index is 22.89 kg/m.  Physical Exam  GENERAL APPEARANCE: Alert, conversant. No acute distress.  HEENT: Unremarkable. RESPIRATORY: Breathing is even, unlabored. Lung sounds are clear   CARDIOVASCULAR: Heart RRR no murmurs, rubs or gallops. No peripheral edema.  GASTROINTESTINAL: Abdomen is soft, non-tender, not distended w/ normal bowel sounds.  NEUROLOGIC: Cranial nerves 2-12 grossly intact. Moves all extremities   Labs reviewed: Basic Metabolic Panel:  Recent Labs  06/14/17 1810 06/15/17 0632 06/17/17 0618 06/22/17  NA 134* 139 140 138  K 5.0 3.7 3.9 4.4  CL 100* 112* 109  --   CO2 24 22 25   --   GLUCOSE 461* 73 95  --     BUN 27* 18 15 22*  CREATININE 1.32* 0.97 1.03 1.0  CALCIUM 10.0 8.4* 9.2  --    No results found for: [facility] Liver Function Tests:  Recent Labs  06/14/17 1810  AST 20  ALT 28  ALKPHOS 113  BILITOT 0.7  PROT 7.9  ALBUMIN 4.6   No results for input(s): LIPASE, AMYLASE in the last 8760 hours.  Recent Labs  06/14/17 1804  AMMONIA 9   CBC:  Recent Labs  06/14/17 1810 06/15/17 0632 06/17/17 0618 06/22/17  WBC 4.8 4.4 3.4* 4.0  NEUTROABS 3.2  --   --   --  HGB 13.2 10.2* 11.0* 11.3*  HCT 38.2* 30.2* 31.9* 34*  MCV 89.5 89.6 89.4  --   PLT 216 176 190 202   Lipid No results for input(s): CHOL, HDL, LDLCALC, TRIG in the last 8760 hours. Cardiac Enzymes: No results for input(s): CKTOTAL, CKMB, CKMBINDEX, TROPONINI in the last 8760 hours. BNP: No results for input(s): BNP in the last 8760 hours. CBG:  Recent Labs  06/18/17 0751 06/18/17 1151 06/18/17 1701  GLUCAP 131* 353* 392*    Procedures and Imaging Studies During Stay: No results found.  Assessment/Plan:   No diagnosis found.   Patient is being discharged with the following home health services:    Patient is being discharged with the following durable medical equipment:    Patient has been advised to f/u with their PCP in 1-2 weeks to bring them up to date on their rehab stay.  Social services at facility was responsible for arranging this appointment.  Pt was provided with a 30 day supply of prescriptions for medications and refills must be obtained from their PCP.  For controlled substances, a more limited supply may be provided adequate until PCP appointment only.  Future labs/tests needed:   Noah Delaine. Sheppard Coil, MD

## 2017-08-12 ENCOUNTER — Encounter: Payer: Self-pay | Admitting: Internal Medicine

## 2017-08-17 ENCOUNTER — Encounter: Payer: Self-pay | Admitting: Internal Medicine

## 2017-08-17 NOTE — Progress Notes (Signed)
Location:  [facility] Room Number: [place]:  SNF [phone]) Provider: Noah Delaine. Sheppard Coil, MD  PCP: Charolette Forward, MD Patient Care Team: Charolette Forward, MD as PCP - General (Internal Medicine)  Extended Emergency Contact Information Primary Emergency Contact: Sledge,Ronald Address: [place], [place] [address] [address] of [place] Phone: [phone] Relation: Son Secondary Emergency Contact: Parkin,Reginald  [place] Mobile Phone: [phone] Relation: Son  Allergies  Allergen Reactions  . Penicillins Swelling    Has patient had a PCN reaction causing immediate rash, facial/tongue/throat swelling, SOB or lightheadedness with hypotension: Unknown Has patient had a PCN reaction causing severe rash involving mucus membranes or skin necrosis: Unknown Has patient had a PCN reaction that required hospitalization: Unknown Has patient had a PCN reaction occurring within the last 10 years: Unknown If all of the above answers are "NO", then may proceed with Cephalosporin use.  Happened in childhood    Chief Complaint  Patient presents with  . Discharge Note    discharge from SNF to home    HPI:  83 y.o. male  With hypertension, hyperlipidemia, diabetes, prior possible stroke, prostate cancer (status post surgery and chemotherapy), colon cancer (status post right colectomy, radiation, chemotherapy)who was admitted to [facility] from 8/23[facility]27 for acute metabolic encephalopathy possibly related to patient's hyperglycemia and dehydration, which resolved. Surgical course was complicated by the acute onset of confusion with no eye contact during episode which raise concern for complex partial seizures. MRI was without acute findings, but old CVA and atrophy was seen. Neurology was consulted and patient was started on Keppra with no further episodes. Patient was admitted to skilled nursing facility for OT  PT and is now ready to be discharged to home.    Past Medical History:  Diagnosis Date  . Acute metabolic encephalopathy 0/86/5784  . CKD (chronic kidney disease), stage III ([facility]) 06/14/2017  . Colon cancer ([place])    colon ca dx 7/11  . Dementia without behavioral disturbance 06/26/2017  . Diabetes mellitus   . History of stroke 06/26/2017  . Hypertension   . Iron deficiency anemia 06/26/2017  . Malignant neoplasm of cecum ([place]) 08/27/2011  . MI (myocardial infarction) ([place])   . 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 Prostate CA [facility])    prostate  . Stroke ([place])   . Type II diabetes mellitus with renal manifestations ([place]) 06/05/2012    Past Surgical History:  Procedure Laterality Date  . COLON SURGERY  06/2010   right colectomy, T3N2  . PORT-A-CATH REMOVAL N/A 10/19/2014   Procedure: MINOR REMOVAL PORT-A-CATH;  Surgeon: Autumn Messing III, MD;  Location: [facility];  Service: General;  Laterality: N/A;  . PROSTATE SURGERY       reports that he has quit smoking. His smoking use included Cigarettes. He has a 0.75 pack-year smoking history. He has never used smokeless tobacco. He reports that he does not drink alcohol or use drugs. Social History   Social History  . Marital status: Divorced    Spouse name: N/A  . Number of children: N/A  . Years of education: N/A   Occupational History  . Not on file.   Social History Main Topics  . Smoking status: Former Smoker    Packs/day: 0.25    Years: 3.00    Types: Cigarettes  . Smokeless tobacco: Never Used  .  Alcohol use No  . Drug use: No  . Sexual activity: No   Other Topics Concern  . Not on file   Social History Narrative   Admitted to [facility] 06/18/17   Divorced   Former smoker   Alcohol none   Full Code       Pertinent  Health Maintenance Due  Topic Date Due  . FOOT EXAM  04/24/1941  . OPHTHALMOLOGY EXAM  04/24/1941  . PNA vac Low Risk Adult (1  of 2 - PCV13) 04/23/1998  . INFLUENZA VACCINE  05/23/2017  . HEMOGLOBIN A1C  12/16/2017    Medications: Allergies as of 07/12/2017      Reactions   Penicillins Swelling   Has patient had a PCN reaction causing immediate rash, facial/tongue/throat swelling, SOB or lightheadedness with hypotension: Unknown Has patient had a PCN reaction causing severe rash involving mucus membranes or skin necrosis: Unknown Has patient had a PCN reaction that required hospitalization: Unknown Has patient had a PCN reaction occurring within the last 10 years: Unknown If all of the above answers are "NO", then may proceed with Cephalosporin use. Happened in childhood      Medication List       Accurate as of 07/12/17 11:59 PM. Always use your most recent med list.          acetaminophen 325 MG tablet Commonly known as:  TYLENOL Take 2 tablets (650 mg total) by mouth every 6 (six) hours as needed for mild pain or fever.   atorvastatin 40 MG tablet Commonly known as:  LIPITOR As directed   ferrous sulfate 325 (65 FE) MG tablet Take 325 mg by mouth 2 (two) times daily.   glimepiride 4 MG tablet Commonly known as:  AMARYL Take 4 mg by mouth 2 (two) times daily.   HUMALOG KWIKPEN 100 UNIT/ML KiwkPen Generic drug:  insulin lispro Inject into the skin. 10 units before meals if CBG greater than 350; Give 3 units with lunch, if CBG greater than 350 give additional 7 units. Give 5 units with dinner, if CBG is great than 350 give 5 additional units to equal 10 units.   insulin glargine 100 UNIT/ML injection Commonly known as:  LANTUS Inject 0.1 mLs (10 Units total) into the skin daily.   lisinopril 20 MG tablet Commonly known as:  PRINIVIL,ZESTRIL Take 1 tablet (20 mg total) by mouth daily.   metFORMIN 1000 MG tablet Commonly known as:  GLUCOPHAGE Take 1,000 mg by mouth 2 (two) times daily with a meal.        Vitals:   07/12/17 0951  BP: 102/64  Pulse: 64  Resp: 18  Temp: 98.4 F (36.9  C)  SpO2: 97%  Weight: 155 lb (70.3 kg)  Height: 5\' 9"  (1.753 m)   Body mass index is 22.89 kg/m.  Physical Exam  GENERAL APPEARANCE: Alert,  No acute distress.  HEENT: Unremarkable. RESPIRATORY: Breathing is even, unlabored. Lung sounds are clear   CARDIOVASCULAR: Heart RRR no murmurs, rubs or gallops. No peripheral edema.  GASTROINTESTINAL: Abdomen is soft, non-tender, not distended w/ normal bowel sounds.  NEUROLOGIC: Cranial nerves 2-12 grossly intact. Moves all extremities   Labs reviewed: Basic Metabolic Panel:  Recent Labs  06/14/17 1810 06/15/17 0632 06/17/17 0618 06/22/17  NA 134* 139 140 138  K 5.0 3.7 3.9 4.4  CL 100* 112* 109  --   CO2 24 22 25   --   GLUCOSE 461* 73 95  --  BUN 27* 18 15 22*  CREATININE 1.32* 0.97 1.03 1.0  CALCIUM 10.0 8.4* 9.2  --    No results found for: [facility] Liver Function Tests:  Recent Labs  06/14/17 1810  AST 20  ALT 28  ALKPHOS 113  BILITOT 0.7  PROT 7.9  ALBUMIN 4.6   No results for input(s): LIPASE, AMYLASE in the last 8760 hours.  Recent Labs  06/14/17 1804  AMMONIA 9   CBC:  Recent Labs  06/14/17 1810 06/15/17 0632 06/17/17 0618 06/22/17  WBC 4.8 4.4 3.4* 4.0  NEUTROABS 3.2  --   --   --   HGB 13.2 10.2* 11.0* 11.3*  HCT 38.2* 30.2* 31.9* 34*  MCV 89.5 89.6 89.4  --   PLT 216 176 190 202   Lipid No results for input(s): CHOL, HDL, LDLCALC, TRIG in the last 8760 hours. Cardiac Enzymes: No results for input(s): CKTOTAL, CKMB, CKMBINDEX, TROPONINI in the last 8760 hours. BNP: No results for input(s): BNP in the last 8760 hours. CBG:  Recent Labs  06/18/17 0751 06/18/17 1151 06/18/17 1701  GLUCAP 131* 353* 392*    Procedures and Imaging Studies During Stay: No results found.  Assessment/Plan:   No diagnosis found.   Patient is being discharged with the following home health services:  OT/PT/nursing  Patient is being discharged with the following durable medical equipment:   none  Patient has been advised to f/u with their PCP in 1-2 weeks to bring them up to date on their rehab stay.  Social services at facility was responsible for arranging this appointment.  Pt was provided with a 30 day supply of prescriptions for medications and refills must be obtained from their PCP.  For controlled substances, a more limited supply may be provided adequate until PCP appointment only.  Medications have been reconciled.    Time spent greater  Than 30 minutes;> 50% of time with patient was spent reviewing records, labs, tests and studies, counseling and developing plan of care  Noah Delaine. Sheppard Coil, MD

## 2017-08-26 ENCOUNTER — Encounter: Payer: Self-pay | Admitting: Internal Medicine

## 2017-11-20 ENCOUNTER — Inpatient Hospital Stay (HOSPITAL_COMMUNITY)
Admission: EM | Admit: 2017-11-20 | Discharge: 2017-12-21 | DRG: 682 | Disposition: E | Payer: Medicare Other | Attending: Critical Care Medicine | Admitting: Critical Care Medicine

## 2017-11-20 ENCOUNTER — Emergency Department (HOSPITAL_COMMUNITY): Payer: Medicare Other

## 2017-11-20 ENCOUNTER — Inpatient Hospital Stay (HOSPITAL_COMMUNITY): Payer: Medicare Other

## 2017-11-20 ENCOUNTER — Encounter (HOSPITAL_COMMUNITY): Payer: Self-pay | Admitting: Family Medicine

## 2017-11-20 DIAGNOSIS — R402124 Coma scale, eyes open, to pain, 24 hours or more after hospital admission: Secondary | ICD-10-CM | POA: Diagnosis not present

## 2017-11-20 DIAGNOSIS — Z66 Do not resuscitate: Secondary | ICD-10-CM | POA: Diagnosis not present

## 2017-11-20 DIAGNOSIS — R17 Unspecified jaundice: Secondary | ICD-10-CM | POA: Diagnosis not present

## 2017-11-20 DIAGNOSIS — R740 Nonspecific elevation of levels of transaminase and lactic acid dehydrogenase [LDH]: Secondary | ICD-10-CM | POA: Diagnosis not present

## 2017-11-20 DIAGNOSIS — N179 Acute kidney failure, unspecified: Secondary | ICD-10-CM

## 2017-11-20 DIAGNOSIS — K8050 Calculus of bile duct without cholangitis or cholecystitis without obstruction: Secondary | ICD-10-CM

## 2017-11-20 DIAGNOSIS — Z833 Family history of diabetes mellitus: Secondary | ICD-10-CM

## 2017-11-20 DIAGNOSIS — Z8674 Personal history of sudden cardiac arrest: Secondary | ICD-10-CM | POA: Diagnosis not present

## 2017-11-20 DIAGNOSIS — D62 Acute posthemorrhagic anemia: Secondary | ICD-10-CM | POA: Diagnosis not present

## 2017-11-20 DIAGNOSIS — Z8673 Personal history of transient ischemic attack (TIA), and cerebral infarction without residual deficits: Secondary | ICD-10-CM

## 2017-11-20 DIAGNOSIS — Z880 Allergy status to penicillin: Secondary | ICD-10-CM

## 2017-11-20 DIAGNOSIS — I469 Cardiac arrest, cause unspecified: Secondary | ICD-10-CM | POA: Diagnosis not present

## 2017-11-20 DIAGNOSIS — E872 Acidosis: Secondary | ICD-10-CM | POA: Diagnosis not present

## 2017-11-20 DIAGNOSIS — Z681 Body mass index (BMI) 19 or less, adult: Secondary | ICD-10-CM

## 2017-11-20 DIAGNOSIS — Z5309 Procedure and treatment not carried out because of other contraindication: Secondary | ICD-10-CM | POA: Diagnosis not present

## 2017-11-20 DIAGNOSIS — Z8546 Personal history of malignant neoplasm of prostate: Secondary | ICD-10-CM

## 2017-11-20 DIAGNOSIS — R1010 Upper abdominal pain, unspecified: Secondary | ICD-10-CM

## 2017-11-20 DIAGNOSIS — E860 Dehydration: Secondary | ICD-10-CM | POA: Diagnosis present

## 2017-11-20 DIAGNOSIS — I252 Old myocardial infarction: Secondary | ICD-10-CM

## 2017-11-20 DIAGNOSIS — Z87891 Personal history of nicotine dependence: Secondary | ICD-10-CM

## 2017-11-20 DIAGNOSIS — K922 Gastrointestinal hemorrhage, unspecified: Secondary | ICD-10-CM | POA: Diagnosis not present

## 2017-11-20 DIAGNOSIS — I10 Essential (primary) hypertension: Secondary | ICD-10-CM | POA: Diagnosis present

## 2017-11-20 DIAGNOSIS — G40209 Localization-related (focal) (partial) symptomatic epilepsy and epileptic syndromes with complex partial seizures, not intractable, without status epilepticus: Secondary | ICD-10-CM | POA: Diagnosis present

## 2017-11-20 DIAGNOSIS — R7989 Other specified abnormal findings of blood chemistry: Secondary | ICD-10-CM

## 2017-11-20 DIAGNOSIS — R7401 Elevation of levels of liver transaminase levels: Secondary | ICD-10-CM

## 2017-11-20 DIAGNOSIS — K859 Acute pancreatitis without necrosis or infection, unspecified: Secondary | ICD-10-CM

## 2017-11-20 DIAGNOSIS — K8051 Calculus of bile duct without cholangitis or cholecystitis with obstruction: Secondary | ICD-10-CM | POA: Diagnosis present

## 2017-11-20 DIAGNOSIS — R402344 Coma scale, best motor response, flexion withdrawal, 24 hours or more after hospital admission: Secondary | ICD-10-CM | POA: Diagnosis not present

## 2017-11-20 DIAGNOSIS — N183 Chronic kidney disease, stage 3 (moderate): Secondary | ICD-10-CM | POA: Diagnosis present

## 2017-11-20 DIAGNOSIS — I248 Other forms of acute ischemic heart disease: Secondary | ICD-10-CM | POA: Diagnosis not present

## 2017-11-20 DIAGNOSIS — E1121 Type 2 diabetes mellitus with diabetic nephropathy: Secondary | ICD-10-CM | POA: Diagnosis present

## 2017-11-20 DIAGNOSIS — N170 Acute kidney failure with tubular necrosis: Secondary | ICD-10-CM | POA: Diagnosis present

## 2017-11-20 DIAGNOSIS — I951 Orthostatic hypotension: Secondary | ICD-10-CM | POA: Diagnosis not present

## 2017-11-20 DIAGNOSIS — A419 Sepsis, unspecified organism: Secondary | ICD-10-CM | POA: Diagnosis not present

## 2017-11-20 DIAGNOSIS — R402214 Coma scale, best verbal response, none, 24 hours or more after hospital admission: Secondary | ICD-10-CM | POA: Diagnosis not present

## 2017-11-20 DIAGNOSIS — R1011 Right upper quadrant pain: Secondary | ICD-10-CM

## 2017-11-20 DIAGNOSIS — R109 Unspecified abdominal pain: Secondary | ICD-10-CM | POA: Diagnosis present

## 2017-11-20 DIAGNOSIS — E43 Unspecified severe protein-calorie malnutrition: Secondary | ICD-10-CM | POA: Diagnosis present

## 2017-11-20 DIAGNOSIS — K831 Obstruction of bile duct: Secondary | ICD-10-CM | POA: Diagnosis not present

## 2017-11-20 DIAGNOSIS — E1142 Type 2 diabetes mellitus with diabetic polyneuropathy: Secondary | ICD-10-CM | POA: Diagnosis present

## 2017-11-20 DIAGNOSIS — N186 End stage renal disease: Secondary | ICD-10-CM | POA: Diagnosis not present

## 2017-11-20 DIAGNOSIS — Z01818 Encounter for other preprocedural examination: Secondary | ICD-10-CM

## 2017-11-20 DIAGNOSIS — C180 Malignant neoplasm of cecum: Secondary | ICD-10-CM | POA: Diagnosis present

## 2017-11-20 DIAGNOSIS — R6521 Severe sepsis with septic shock: Secondary | ICD-10-CM | POA: Diagnosis not present

## 2017-11-20 DIAGNOSIS — R579 Shock, unspecified: Secondary | ICD-10-CM | POA: Diagnosis not present

## 2017-11-20 DIAGNOSIS — F0390 Unspecified dementia without behavioral disturbance: Secondary | ICD-10-CM | POA: Diagnosis present

## 2017-11-20 DIAGNOSIS — J9601 Acute respiratory failure with hypoxia: Secondary | ICD-10-CM | POA: Diagnosis not present

## 2017-11-20 DIAGNOSIS — Z794 Long term (current) use of insulin: Secondary | ICD-10-CM

## 2017-11-20 DIAGNOSIS — Z85048 Personal history of other malignant neoplasm of rectum, rectosigmoid junction, and anus: Secondary | ICD-10-CM

## 2017-11-20 DIAGNOSIS — E785 Hyperlipidemia, unspecified: Secondary | ICD-10-CM | POA: Diagnosis present

## 2017-11-20 DIAGNOSIS — E1122 Type 2 diabetes mellitus with diabetic chronic kidney disease: Secondary | ICD-10-CM | POA: Diagnosis not present

## 2017-11-20 DIAGNOSIS — J9602 Acute respiratory failure with hypercapnia: Secondary | ICD-10-CM | POA: Diagnosis not present

## 2017-11-20 DIAGNOSIS — I129 Hypertensive chronic kidney disease with stage 1 through stage 4 chronic kidney disease, or unspecified chronic kidney disease: Secondary | ICD-10-CM | POA: Diagnosis present

## 2017-11-20 DIAGNOSIS — K851 Biliary acute pancreatitis without necrosis or infection: Secondary | ICD-10-CM | POA: Diagnosis present

## 2017-11-20 DIAGNOSIS — Z9049 Acquired absence of other specified parts of digestive tract: Secondary | ICD-10-CM

## 2017-11-20 DIAGNOSIS — R778 Other specified abnormalities of plasma proteins: Secondary | ICD-10-CM

## 2017-11-20 DIAGNOSIS — D631 Anemia in chronic kidney disease: Secondary | ICD-10-CM | POA: Diagnosis present

## 2017-11-20 DIAGNOSIS — E1129 Type 2 diabetes mellitus with other diabetic kidney complication: Secondary | ICD-10-CM | POA: Diagnosis present

## 2017-11-20 DIAGNOSIS — R748 Abnormal levels of other serum enzymes: Secondary | ICD-10-CM | POA: Diagnosis not present

## 2017-11-20 DIAGNOSIS — I2510 Atherosclerotic heart disease of native coronary artery without angina pectoris: Secondary | ICD-10-CM | POA: Diagnosis present

## 2017-11-20 HISTORY — DX: Hyperlipidemia, unspecified: E78.5

## 2017-11-20 HISTORY — DX: Type 2 diabetes mellitus with diabetic nephropathy: E11.21

## 2017-11-20 LAB — CBC WITH DIFFERENTIAL/PLATELET
BASOS ABS: 0 10*3/uL (ref 0.0–0.1)
BASOS PCT: 1 %
Eosinophils Absolute: 0 10*3/uL (ref 0.0–0.7)
Eosinophils Relative: 1 %
HCT: 29.8 % — ABNORMAL LOW (ref 39.0–52.0)
HEMOGLOBIN: 10.2 g/dL — AB (ref 13.0–17.0)
Lymphocytes Relative: 12 %
Lymphs Abs: 0.6 10*3/uL — ABNORMAL LOW (ref 0.7–4.0)
MCH: 30.7 pg (ref 26.0–34.0)
MCHC: 34.2 g/dL (ref 30.0–36.0)
MCV: 89.8 fL (ref 78.0–100.0)
Monocytes Absolute: 0.6 10*3/uL (ref 0.1–1.0)
Monocytes Relative: 14 %
NEUTROS ABS: 3.2 10*3/uL (ref 1.7–7.7)
NEUTROS PCT: 72 %
Platelets: 213 10*3/uL (ref 150–400)
RBC: 3.32 MIL/uL — AB (ref 4.22–5.81)
RDW: 16.5 % — ABNORMAL HIGH (ref 11.5–15.5)
WBC: 4.4 10*3/uL (ref 4.0–10.5)

## 2017-11-20 LAB — LACTIC ACID, PLASMA: Lactic Acid, Venous: 1.7 mmol/L (ref 0.5–1.9)

## 2017-11-20 LAB — COMPREHENSIVE METABOLIC PANEL
ALBUMIN: 3.1 g/dL — AB (ref 3.5–5.0)
ALT: 164 U/L — ABNORMAL HIGH (ref 17–63)
ANION GAP: 20 — AB (ref 5–15)
AST: 216 U/L — ABNORMAL HIGH (ref 15–41)
Alkaline Phosphatase: 1158 U/L — ABNORMAL HIGH (ref 38–126)
BILIRUBIN TOTAL: 8.4 mg/dL — AB (ref 0.3–1.2)
BUN: 90 mg/dL — ABNORMAL HIGH (ref 6–20)
CO2: 15 mmol/L — ABNORMAL LOW (ref 22–32)
Calcium: 7.1 mg/dL — ABNORMAL LOW (ref 8.9–10.3)
Chloride: 102 mmol/L (ref 101–111)
Creatinine, Ser: 10.75 mg/dL — ABNORMAL HIGH (ref 0.61–1.24)
GFR calc Af Amer: 4 mL/min — ABNORMAL LOW (ref >60)
GFR, EST NON AFRICAN AMERICAN: 4 mL/min — AB (ref >60)
GLUCOSE: 199 mg/dL — AB (ref 65–99)
POTASSIUM: 4.7 mmol/L (ref 3.5–5.1)
Sodium: 137 mmol/L (ref 135–145)
TOTAL PROTEIN: 7.2 g/dL (ref 6.5–8.1)

## 2017-11-20 LAB — PROTIME-INR
INR: 1.28
Prothrombin Time: 15.9 seconds — ABNORMAL HIGH (ref 11.4–15.2)

## 2017-11-20 LAB — TROPONIN I
TROPONIN I: 0.29 ng/mL — AB (ref <0.03)
Troponin I: 0.26 ng/mL (ref <0.03)

## 2017-11-20 LAB — LIPASE, BLOOD: LIPASE: 175 U/L — AB (ref 11–51)

## 2017-11-20 MED ORDER — SODIUM CHLORIDE 0.9 % IV SOLN
INTRAVENOUS | Status: DC
  Administered 2017-11-20: 14:00:00 via INTRAVENOUS

## 2017-11-20 MED ORDER — POLYETHYLENE GLYCOL 3350 17 G PO PACK: 17.0000 g | PACK | Freq: Every day | ORAL | Status: DC | PRN

## 2017-11-20 MED ORDER — SODIUM CHLORIDE 0.9 % IV SOLN: INTRAVENOUS | Status: AC

## 2017-11-20 MED ORDER — SODIUM CHLORIDE 0.9 % IV BOLUS (SEPSIS)
500.0000 mL | Freq: Once | INTRAVENOUS | Status: AC
  Administered 2017-11-20: 500 mL via INTRAVENOUS

## 2017-11-20 MED ORDER — ACETAMINOPHEN 325 MG PO TABS
650.0000 mg | ORAL_TABLET | Freq: Four times a day (QID) | ORAL | Status: DC | PRN
  Filled 2017-11-20: qty 2

## 2017-11-20 MED ORDER — HEPARIN SODIUM (PORCINE) 5000 UNIT/ML IJ SOLN
5000.0000 [IU] | Freq: Three times a day (TID) | INTRAMUSCULAR | Status: DC
  Administered 2017-11-20 – 2017-11-23 (×8): 5000 [IU] via SUBCUTANEOUS
  Filled 2017-11-20 (×9): qty 1

## 2017-11-20 NOTE — Progress Notes (Signed)
Patient arrived to unit, alert, oriented to self. Pt com,plain of pain in low abdomen. Skin intact, dry. Will place foley per order.

## 2017-11-20 NOTE — ED Provider Notes (Signed)
[facility] DEPT Provider Note   CSN: [id number] Arrival date & time: 11/04/2017  1334     History   Chief Complaint Chief Complaint  Patient presents with  . Abdominal Pain    HPI [name] is a 84 y.o. male.  Patient received as signout from Dr. Thurnell Garbe  84 year old male with prior history of dementia, diabetes, hypertension, hyperlipidemia, CAD, and CVA presents with reported decreased p.o. intake and reported diffuse abdominal pain.  Patient with dementia and is unable to provide significant history.  Majority of history is obtained through signout and through record review.  At time of my evaluation patient appears to be pleasantly confused and without specific complaint.  He reports that he is "locked and loaded."   The history is provided by the EMS personnel and medical records.  Illness  This is a new problem. The current episode started 2 days ago. The problem occurs constantly. The problem has not changed since onset.Nothing aggravates the symptoms. Nothing relieves the symptoms.    Past Medical History:  Diagnosis Date  . Acute metabolic encephalopathy 10/28/2692  . CKD (chronic kidney disease), stage III ([place]) 06/14/2017  . Colon cancer ([place])    colon ca dx 7/11  . Dementia without behavioral disturbance 06/26/2017  . Diabetes mellitus   . Diabetic nephropathy ([place])   . History of stroke 06/26/2017  . Hyperlipemia   . Hypertension   . Iron deficiency anemia 06/26/2017  . Malignant neoplasm of cecum ([place]) 08/27/2011  . MI (myocardial infarction) ([place])   . 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 Prostate CA [facility])    prostate  . Stroke ([place])   . Type II diabetes mellitus with renal manifestations ([place]) 06/05/2012    Patient Active Problem List   Diagnosis Date Noted  . AKI (acute kidney injury) ([place]) 11/14/2017  . 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 Dementia without behavioral disturbance 06/26/2017  . History of stroke 06/26/2017  . Iron deficiency anemia 06/26/2017  . Acute metabolic encephalopathy 85/46/2703  . CKD (chronic kidney disease), stage III ([place]) 06/14/2017  . HLD (hyperlipidemia) 06/14/2017  . Stroke ([place])   . Hypertension 06/05/2012  . Type II diabetes mellitus with renal manifestations ([place]) 06/05/2012  . Malignant neoplasm of cecum ([place]) 08/27/2011    Past Surgical History:  Procedure Laterality Date  . COLON SURGERY  06/2010   right colectomy, T3N2  . PORT-A-CATH REMOVAL N/A 10/19/2014   Procedure: MINOR REMOVAL PORT-A-CATH;  Surgeon: Autumn Messing III, MD;  Location: [place];  Service: General;  Laterality: N/A;  . PROSTATE SURGERY         Home Medications    Prior to Admission medications   Medication Sig Start Date End Date Taking? Authorizing Provider  atorvastatin (LIPITOR) 40 MG tablet As directed 08/29/15  Yes [provider]  ferrous sulfate 325 (65 FE) MG tablet Take 325 mg by mouth 2 (two) times daily.   Yes [provider]  insulin lispro (HUMALOG KWIKPEN) 100 UNIT/ML KiwkPen Inject into the skin. 10 units before meals if CBG greater than 350; GIVE 10 UNITS IF BS  GREATER THAN 350. Give 3 units with lunch, if CBG greater than 350 give additional 7 units. Give 5 units with dinner, if CBG is great than 350 give 5 additional units to equal 10 units.   Yes [provider]  lisinopril (PRINIVIL,ZESTRIL) 20 MG tablet Take 1 tablet (20  mg total) by mouth daily. 06/15/17  Yes [name], MD  acetaminophen (TYLENOL) 325 MG tablet Take 2 tablets (650 mg total) by mouth every 6 (six) hours as needed for mild pain or fever. 06/18/17   [name], MD  insulin glargine (LANTUS) 100 UNIT/ML injection Inject 0.1 mLs (10 Units total) into the skin daily. Patient not taking: Reported on 11/03/2017 06/18/17   [name], MD  metFORMIN (GLUCOPHAGE) 500  MG tablet Take 500 mg by mouth daily. 11/12/17   [provider]    Family History Family History  Problem Relation Age of Onset  . Diabetes Mellitus II Mother     Social History Social History   Tobacco Use  . Smoking status: Former Smoker    Packs/day: 0.25    Years: 3.00    Pack years: 0.75    Types: Cigarettes  . Smokeless tobacco: Never Used  Substance Use Topics  . Alcohol use: No  . Drug use: No     Allergies   Penicillins   Review of Systems Review of Systems  Unable to perform ROS: Dementia     Physical Exam Updated Vital Signs BP 123/62 (BP Location: Right Arm)   Pulse (!) 55   Temp (!) 97.2 F (36.2 C) (Oral)   Resp 15   SpO2 100%   Physical Exam  Constitutional: He appears well-developed and well-nourished. No distress.  HENT:  Head: Normocephalic and atraumatic.  Mouth/Throat: Oropharynx is clear and moist.  Eyes: Conjunctivae and EOM are normal. Pupils are equal, round, and reactive to light. Scleral icterus is present.  Icteric sclera  Neck: Normal range of motion. Neck supple.  Cardiovascular: Normal rate, regular rhythm and normal heart sounds.  Pulmonary/Chest: Effort normal and breath sounds normal. No respiratory distress.  Abdominal: Soft. Normal appearance. He exhibits no distension, no ascites and no mass. There is no tenderness.  Musculoskeletal: Normal range of motion. He exhibits no edema or deformity.  Neurological: He is alert.  Pleasantly confused  Skin: Skin is warm and dry.  Psychiatric: He has a normal mood and affect.  Nursing note and vitals reviewed.    ED Treatments / Results  Labs (all labs ordered are listed, but only abnormal results are displayed) Labs Reviewed  COMPREHENSIVE METABOLIC PANEL - Abnormal; Notable for the following components:      Result Value   CO2 15 (*)    Glucose, Bld 199 (*)    BUN 90 (*)    Creatinine, Ser 10.75 (*)    Calcium 7.1 (*)    Albumin 3.1 (*)    AST 216 (*)     ALT 164 (*)    Alkaline Phosphatase 1,158 (*)    Total Bilirubin 8.4 (*)    GFR calc non Af Amer 4 (*)    GFR calc Af Amer 4 (*)    Anion gap 20 (*)    All other components within normal limits  LIPASE, BLOOD - Abnormal; Notable for the following components:   Lipase 175 (*)    All other components within normal limits  CBC WITH DIFFERENTIAL/PLATELET - Abnormal; Notable for the following components:   RBC 3.32 (*)    Hemoglobin 10.2 (*)    HCT 29.8 (*)    RDW 16.5 (*)    Lymphs Abs 0.6 (*)    All other components within normal limits  TROPONIN I - Abnormal; Notable for the following components:   Troponin I 0.29 (*)    All other components  within normal limits  LACTIC ACID, PLASMA  LACTIC ACID, PLASMA  URINALYSIS, ROUTINE W REFLEX MICROSCOPIC  CREATININE, SERUM  URINALYSIS, COMPLETE (UACMP) WITH MICROSCOPIC  COMPREHENSIVE METABOLIC PANEL  CBC  NA AND K (SODIUM & POTASSIUM), RAND UR  CREATININE, URINE, RANDOM  PROTEIN / CREATININE RATIO, URINE    EKG  EKG Interpretation  Date/Time:  Tuesday November 20 2017 15:02:57 EST Ventricular Rate:  53 PR Interval:    QRS Duration: 86 QT Interval:  490 QTC Calculation: 461 R Axis:   85 Text Interpretation:  Sinus rhythm Borderline right axis deviation Low voltage, precordial leads Probable anteroseptal infarct, old Baseline wander When compared with ECG of 06/14/2017 No significant change was found Confirmed by Francine Graven [id number]) on 10/24/2017 3:18:49 PM       Radiology Ct Abdomen Pelvis Wo Contrast  Result Date: 11/12/2017 CLINICAL DATA:  84 year old male with history of abdominal pain and poor PO intake over the past 2 days. EXAM: CT ABDOMEN AND PELVIS WITHOUT CONTRAST TECHNIQUE: Multidetector CT imaging of the abdomen and pelvis was performed following the standard protocol without IV contrast. COMPARISON:  CT of the chest, abdomen and pelvis 08/31/2015. FINDINGS: Lower chest: Atherosclerotic calcifications in the left  anterior descending, left circumflex and right coronary arteries. Small calcified pleural plaques in the base of the right hemithorax. No definite calcified pleural plaques noted in the visualized left hemithorax. Hepatobiliary: No definite cystic or solid hepatic lesions are noted on today's noncontrast CT examination. Amorphous intermediate attenuation lying dependently in the gallbladder, presumably biliary sludge. No surrounding inflammatory changes are noted to suggest an acute cholecystitis at this time. Pancreas: No definite pancreatic mass or peripancreatic inflammatory changes on today's noncontrast CT examination. Spleen: Unremarkable. Adrenals/Urinary Tract: Low-attenuation lesions in both kidneys, incompletely characterized on today's noncontrast CT examination, but previously characterized as cysts. The largest of these is exophytic in the upper pole the left kidney measuring 7.5 x 8.6 cm, and demonstrates some peripheral calcifications in the wall, similar to the prior study. Bilateral adrenal glands are normal in appearance. No hydroureteronephrosis. Urinary bladder is normal in appearance. Stomach/Bowel: Unenhanced appearance of the stomach is normal. No pathologic dilatation of small bowel or colon. Status post right hemicolectomy. Vascular/Lymphatic: Aortic atherosclerosis, without evidence of aneurysm in the abdominal or pelvic vasculature. No lymphadenopathy noted in the abdomen or pelvis on today's noncontrast CT examination. Reproductive: Fiducial markers adjacent to the prostate gland. Prostate gland and seminal vesicles are otherwise unremarkable in appearance. Other: No significant volume of ascites.  No pneumoperitoneum. Musculoskeletal: There are no aggressive appearing lytic or blastic lesions noted in the visualized portions of the skeleton. IMPRESSION: 1. No definite acute findings are noted in the abdomen or pelvis to account for the patient's symptoms. 2. Aortic atherosclerosis, in  addition to least 3 vessel coronary artery disease. Please note that although the presence of coronary artery calcium documents the presence of coronary artery disease, the severity of this disease and any potential stenosis cannot be assessed on this non-gated CT examination. Assessment for potential risk factor modification, dietary therapy or pharmacologic therapy may be warranted, if clinically indicated. 3. Calcified pleural plaques in the base of the right hemithorax. Left-sided calcified pleural plaques were previously noted on chest CT 08/31/2015. 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 4. Additional incidental findings, as above. Aortic Atherosclerosis (ICD10-I70.0). Electronically Signed   By: Vinnie Langton M.D.   On: 11/03/2017 16:26   Dg Chest 2 View  Result Date: 10/30/2017 CLINICAL  DATA:  Abdominal pain. EXAM: CHEST  2 VIEW COMPARISON:  CT 08/31/2015. FINDINGS: Mediastinum hilar structures normal. Lungs are clear. No pleural effusion or pneumothorax. Costophrenic angles incompletely imaged. Heart size normal. No acute bony abnormality. IMPRESSION: No acute cardiopulmonary disease. Electronically Signed   By: Marcello Moores  Register   On: 11/19/2017 14:24    Procedures Procedures (including critical care time)  Medications Ordered in ED Medications  0.9 %  sodium chloride infusion ( Intravenous Rate/Dose Change 11/08/2017 1627)  acetaminophen (TYLENOL) tablet 650 mg (not administered)  heparin injection 5,000 Units (not administered)  polyethylene glycol (MIRALAX / GLYCOLAX) packet 17 g (not administered)  0.9 %  sodium chloride infusion (not administered)  sodium chloride 0.9 % bolus 500 mL (0 mLs Intravenous Stopped 11/04/2017 1549)  sodium chloride 0.9 % bolus 500 mL (500 mLs Intravenous New Bag/Given 11/19/2017 1627)     Initial Impression / Assessment and Plan / ED Course  I have reviewed the triage vital signs and the nursing notes.  Pertinent  labs & imaging results that were available during my care of the patient were reviewed by me and considered in my medical decision making (see chart for details).     MDM  Screen Complete  Received as signout from Dr. Thurnell Garbe  Patient is presenting with reported decreased p.o. intake and possible abdominal discomfort.  Screening laboratory evaluation reveals significantly elevation in creatinine and transaminitis.  Suspect that presentation is consistent with significant dehydration with concurrent biliary tract disease.  Will admit to the hospitalist service for further evaluation and treatment.   Final Clinical Impressions(s) / ED Diagnoses   Final diagnoses:  Acute renal failure, unspecified acute renal failure type ([place])  Transaminitis  Abdominal pain, unspecified abdominal location  Acute pancreatitis, unspecified complication status, unspecified pancreatitis type  Elevated troponin    ED Discharge Orders    None       Valarie Merino, MD 11/05/2017 1751

## 2017-11-20 NOTE — ED Notes (Signed)
Bed: WA02 Expected date:  Expected time:  Means of arrival:  Comments: EMS-abdominal pain/vomiting

## 2017-11-20 NOTE — ED Triage Notes (Signed)
Patient is from [facility] and transported vial [employer]. Per facility, patient is experiencing med abd pain with loss of appetite x 2 days ago. Initially, when a BLS team evaluated patient he was orthostatic hypotensive with a lying systolic of 92 and sitting systolic of 72. ACLS team reports he had some left lower quad tenderness and complained of pain only when moved from EMS stretcher to the hospital stretcher. Patient is alert, oriented to his baseline per EMS.

## 2017-11-20 NOTE — ED Notes (Signed)
ED TO INPATIENT HANDOFF REPORT  Name/Age/Gender [name] 84 y.o. male  Code Status    Code Status Orders  (From admission, onward)        Start     Ordered   11/16/2017 1738  Full code  Continuous     11/16/2017 1745    Code Status History    Date Active Date Inactive Code Status Order ID Comments User Context   06/14/2017 20:46 06/18/2017 20:25 Full Code [id number]  Ivor Costa, MD ED      Home/SNF/Other Nursing Home  Chief Complaint abd pain  Level of Care/Admitting Diagnosis ED Disposition    ED Disposition Condition [place] [facility] Area: [facility] [[id number]]  Level of Care: Telemetry [5]  Admit to tele based on following criteria: Other see comments  Comments: severe AKI  Diagnosis: AKI (acute kidney injury) [facility]) [[id number]]  Admitting Physician: Desiree Hane [[id number]]  Attending Physician: Desiree Hane [phone]  Estimated length of stay: past midnight tomorrow  Certification:: I certify this patient will need inpatient services for at least 2 midnights  PT Class (Do Not Modify): Inpatient [101]  PT Acc Code (Do Not Modify): Private [1]       Medical History Past Medical History:  Diagnosis Date  . Acute metabolic encephalopathy 06/01/1750  . CKD (chronic kidney disease), stage III ([place]) 06/14/2017  . Colon cancer ([place])    colon ca dx 7/11  . Dementia without behavioral disturbance 06/26/2017  . Diabetes mellitus   . Diabetic nephropathy ([place])   . History of stroke 06/26/2017  . Hyperlipemia   . Hypertension   . Iron deficiency anemia 06/26/2017  . Malignant neoplasm of cecum ([place]) 08/27/2011  . MI (myocardial infarction) ([place])   . 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 Prostate CA [facility])    prostate  . Stroke ([place])   . Type II diabetes mellitus with renal manifestations ([place]) 06/05/2012    Allergies Allergies  Allergen Reactions  . Penicillins Swelling    Has patient had a  PCN reaction causing immediate rash, facial/tongue/throat swelling, SOB or lightheadedness with hypotension: Unknown Has patient had a PCN reaction causing severe rash involving mucus membranes or skin necrosis: Unknown Has patient had a PCN reaction that required hospitalization: Unknown Has patient had a PCN reaction occurring within the last 10 years: Unknown If all of the above answers are "NO", then may proceed with Cephalosporin use.  Happened in childhood    IV Location/Drains/Wounds Patient Lines/Drains/Airways Status   Active Line/Drains/Airways    Name:   Placement date:   Placement time:   Site:   Days:   Implanted Port 08/16/10 Left Chest   08/16/10    -    Chest   2653   Implanted Port Left Chest   -    -    Chest      Peripheral IV 11/13/2017 Right Hand   11/03/2017    1347    Hand   less than 1   Peripheral IV 11/15/2017 Left Hand   10/26/2017    1503    Hand   less than 1          Labs/Imaging Results for orders placed or performed during the hospital encounter of 10/30/2017 (from the past 48 hour(s))  Comprehensive metabolic panel     Status: Abnormal   Collection Time: 11/03/2017  2:24 PM  Result Value Ref Range   Sodium 137  135 - 145 mmol/L   Potassium 4.7 3.5 - 5.1 mmol/L   Chloride 102 101 - 111 mmol/L   CO2 15 (L) 22 - 32 mmol/L   Glucose, Bld 199 (H) 65 - 99 mg/dL   BUN 90 (H) 6 - 20 mg/dL   Creatinine, Ser 10.75 (H) 0.61 - 1.24 mg/dL   Calcium 7.1 (L) 8.9 - 10.3 mg/dL   Total Protein 7.2 6.5 - 8.1 g/dL   Albumin 3.1 (L) 3.5 - 5.0 g/dL   AST 216 (H) 15 - 41 U/L   ALT 164 (H) 17 - 63 U/L   Alkaline Phosphatase 1,158 (H) 38 - 126 U/L   Total Bilirubin 8.4 (H) 0.3 - 1.2 mg/dL   GFR calc non Af Amer 4 (L) >60 mL/min   GFR calc Af Amer 4 (L) >60 mL/min    Comment: (NOTE) The eGFR has been calculated using the CKD EPI equation. This calculation has not been validated in all clinical situations. eGFR's persistently <60 mL/min signify possible Chronic  Kidney Disease.    Anion gap 20 (H) 5 - 15  Lipase, blood     Status: Abnormal   Collection Time: 11/21/2017  2:24 PM  Result Value Ref Range   Lipase 175 (H) 11 - 51 U/L  CBC with Differential     Status: Abnormal   Collection Time: 11/12/2017  2:24 PM  Result Value Ref Range   WBC 4.4 4.0 - 10.5 K/uL   RBC 3.32 (L) 4.22 - 5.81 MIL/uL   Hemoglobin 10.2 (L) 13.0 - 17.0 g/dL   HCT 29.8 (L) 39.0 - 52.0 %   MCV 89.8 78.0 - 100.0 fL   MCH 30.7 26.0 - 34.0 pg   MCHC 34.2 30.0 - 36.0 g/dL   RDW 16.5 (H) 11.5 - 15.5 %   Platelets 213 150 - 400 K/uL   Neutrophils Relative % 72 %   Neutro Abs 3.2 1.7 - 7.7 K/uL   Lymphocytes Relative 12 %   Lymphs Abs 0.6 (L) 0.7 - 4.0 K/uL   Monocytes Relative 14 %   Monocytes Absolute 0.6 0.1 - 1.0 K/uL   Eosinophils Relative 1 %   Eosinophils Absolute 0.0 0.0 - 0.7 K/uL   Basophils Relative 1 %   Basophils Absolute 0.0 0.0 - 0.1 K/uL  Lactic acid, plasma     Status: None   Collection Time: 10/25/2017  2:24 PM  Result Value Ref Range   Lactic Acid, Venous 1.7 0.5 - 1.9 mmol/L  Troponin I     Status: Abnormal   Collection Time: 11/16/2017  2:24 PM  Result Value Ref Range   Troponin I 0.29 (HH) <0.03 ng/mL    Comment: CRITICAL RESULT CALLED TO, READ BACK BY AND VERIFIED WITH: WEST,S. RN '@1548'$  ON 01.29.19 BY COHEN,K    Ct Abdomen Pelvis Wo Contrast  Result Date: 11/13/2017 CLINICAL DATA:  84 year old male with history of abdominal pain and poor PO intake over the past 2 days. EXAM: CT ABDOMEN AND PELVIS WITHOUT CONTRAST TECHNIQUE: Multidetector CT imaging of the abdomen and pelvis was performed following the standard protocol without IV contrast. COMPARISON:  CT of the chest, abdomen and pelvis 08/31/2015. FINDINGS: Lower chest: Atherosclerotic calcifications in the left anterior descending, left circumflex and right coronary arteries. Small calcified pleural plaques in the base of the right hemithorax. No definite calcified pleural plaques noted in the  visualized left hemithorax. Hepatobiliary: No definite cystic or solid hepatic lesions are noted on today's noncontrast CT examination.  Amorphous intermediate attenuation lying dependently in the gallbladder, presumably biliary sludge. No surrounding inflammatory changes are noted to suggest an acute cholecystitis at this time. Pancreas: No definite pancreatic mass or peripancreatic inflammatory changes on today's noncontrast CT examination. Spleen: Unremarkable. Adrenals/Urinary Tract: Low-attenuation lesions in both kidneys, incompletely characterized on today's noncontrast CT examination, but previously characterized as cysts. The largest of these is exophytic in the upper pole the left kidney measuring 7.5 x 8.6 cm, and demonstrates some peripheral calcifications in the wall, similar to the prior study. Bilateral adrenal glands are normal in appearance. No hydroureteronephrosis. Urinary bladder is normal in appearance. Stomach/Bowel: Unenhanced appearance of the stomach is normal. No pathologic dilatation of small bowel or colon. Status post right hemicolectomy. Vascular/Lymphatic: Aortic atherosclerosis, without evidence of aneurysm in the abdominal or pelvic vasculature. No lymphadenopathy noted in the abdomen or pelvis on today's noncontrast CT examination. Reproductive: Fiducial markers adjacent to the prostate gland. Prostate gland and seminal vesicles are otherwise unremarkable in appearance. Other: No significant volume of ascites.  No pneumoperitoneum. Musculoskeletal: There are no aggressive appearing lytic or blastic lesions noted in the visualized portions of the skeleton. IMPRESSION: 1. No definite acute findings are noted in the abdomen or pelvis to account for the patient's symptoms. 2. Aortic atherosclerosis, in addition to least 3 vessel coronary artery disease. Please note that although the presence of coronary artery calcium documents the presence of coronary artery disease, the severity of  this disease and any potential stenosis cannot be assessed on this non-gated CT examination. Assessment for potential risk factor modification, dietary therapy or pharmacologic therapy may be warranted, if clinically indicated. 3. Calcified pleural plaques in the base of the right hemithorax. Left-sided calcified pleural plaques were previously noted on chest CT 08/31/2015. 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 4. Additional incidental findings, as above. Aortic Atherosclerosis (ICD10-I70.0). Electronically Signed   By: Vinnie Langton M.D.   On: 11/16/2017 16:26   Dg Chest 2 View  Result Date: 11/08/2017 CLINICAL DATA:  Abdominal pain. EXAM: CHEST  2 VIEW COMPARISON:  CT 08/31/2015. FINDINGS: Mediastinum hilar structures normal. Lungs are clear. No pleural effusion or pneumothorax. Costophrenic angles incompletely imaged. Heart size normal. No acute bony abnormality. IMPRESSION: No acute cardiopulmonary disease. Electronically Signed   By: Marcello Moores  Register   On: 11/14/2017 14:24    Pending Labs Unresulted Labs (From admission, onward)   Start     Ordered   11/21/17 0500  Comprehensive metabolic panel  Daily,   R     11/06/2017 1745   11/21/17 0500  CBC  Daily,   R     11/19/2017 1745   11/12/2017 1745  Protein / creatinine ratio, urine  Once,   R     11/03/2017 1745   11/05/2017 1742  Creatinine, urine, random  Once,   R     11/11/2017 1745   11/03/2017 1741  Na and K (sodium & potassium), rand urine  Once,   R     11/13/2017 1745   10/30/2017 1740  Urinalysis, Complete w Microscopic  Once,   R     11/16/2017 1745   11/07/2017 1737  Creatinine, serum  (heparin)  Once,   R    Comments:  Baseline for heparin therapy IF NOT ALREADY DRAWN.    11/03/2017 1745   11/04/2017 1408  Lactic acid, plasma  Now then every 2 hours,   STAT     11/10/2017 1407   11/15/2017 1408  Urinalysis,  Routine w reflex microscopic  STAT,   STAT     10/25/2017 1407      Vitals/Pain Today's Vitals    10/24/2017 1356 11/10/2017 1530 10/28/2017 1600 10/24/2017 1701  BP: 119/64 109/63 110/63 123/62  Pulse: (!) 50 (!) 56 (!) 54 (!) 55  Resp: '16 18 14 15  '$ Temp: (!) 97.2 F (36.2 C)     TempSrc: Oral     SpO2: 100% 100% 98% 100%    Isolation Precautions No active isolations  Medications Medications  0.9 %  sodium chloride infusion ( Intravenous Rate/Dose Change 11/03/2017 1627)  acetaminophen (TYLENOL) tablet 650 mg (not administered)  heparin injection 5,000 Units (not administered)  polyethylene glycol (MIRALAX / GLYCOLAX) packet 17 g (not administered)  0.9 %  sodium chloride infusion (not administered)  sodium chloride 0.9 % bolus 500 mL (0 mLs Intravenous Stopped 11/04/2017 1549)  sodium chloride 0.9 % bolus 500 mL (500 mLs Intravenous New Bag/Given 10/23/2017 1627)    Mobility walks

## 2017-11-20 NOTE — ED Notes (Signed)
RN notified of abnormal lab 

## 2017-11-20 NOTE — ED Notes (Signed)
EKG given to doctor Mcmanus.

## 2017-11-20 NOTE — H&P (Addendum)
History and Physical  Abir Craine BJY:[id number] DOB: [birth year] DOA: 11/14/2017  Referring physician: Dene Gentry PCP: Charolette Forward, MD  Outpatient Specialists: None Patient coming from:SNF, [facility]   Chief Complaint: abdominal pain  HPI: Eddrick Dilone is a 84 y.o. male with medical history significant for hypertension, hyperlipidemia, type 2 diabetes, prostate cancer, colorectal cancer status post hemicolectomy, seizures, early dementia who presents on 11/06/2017 with complaints of abdominal pain and decreased appetite was found to have AK I, transaminitis, hyperbilirubinemia of unclear etiology.  Mr. Rossell is unable to provide much history given his dementia.  He only states that he is noticed some abdominal pain and that he is not eating.  He says his belly pain is "neutral".  He does not specifically endorse a decreased appetite. Per facility report patient has been having abdominal pain with decreased appetite for 2 days.  Patient was noted to be orthostatic at the facility of the lying systolic of 92 which dropped to 72 upon sitting.   ED Course: Patient is afebrile, hemodynamically stable.  Lab workup significant for creatinine of 10.75, BUN 90, lipase 175, AST 216, ALT 164, alk phos 1158, total bilirubin 8.4.  Troponin 0 0.29.  WBC 4.4, hemoglobin 10.5. CT noncontrast was unremarkable. Given  significantly elevated creatinine, patient was admitted to hospitalist service  Review of Systems:As mentioned in the history of present illness.Review of systems are otherwise negative Patient seen in the ED.    Past Medical History:  Diagnosis Date  . Acute metabolic encephalopathy 0/86/5784  . CKD (chronic kidney disease), stage III ([place]) 06/14/2017  . Colon cancer ([place])    colon ca dx 7/11  . Dementia without behavioral disturbance 06/26/2017  . Diabetes mellitus   . Diabetic nephropathy ([place])   . History of stroke 06/26/2017  . Hyperlipemia   . Hypertension   . Iron  deficiency anemia 06/26/2017  . Malignant neoplasm of cecum ([place]) 08/27/2011  . MI (myocardial infarction) ([place])   . 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 Prostate CA [facility])    prostate  . Stroke ([place])   . Type II diabetes mellitus with renal manifestations ([place]) 06/05/2012   Past Surgical History:  Procedure Laterality Date  . COLON SURGERY  06/2010   right colectomy, T3N2  . PORT-A-CATH REMOVAL N/A 10/19/2014   Procedure: MINOR REMOVAL PORT-A-CATH;  Surgeon: Autumn Messing III, MD;  Location: [place];  Service: General;  Laterality: N/A;  . PROSTATE SURGERY      Social History:  reports that he has quit smoking. His smoking use included cigarettes. He has a 0.75 pack-year smoking history. he has never used smokeless tobacco. He reports that he does not drink alcohol or use drugs.   Allergies  Allergen Reactions  . Penicillins Swelling    Has patient had a PCN reaction causing immediate rash, facial/tongue/throat swelling, SOB or lightheadedness with hypotension: Unknown Has patient had a PCN reaction causing severe rash involving mucus membranes or skin necrosis: Unknown Has patient had a PCN reaction that required hospitalization: Unknown Has patient had a PCN reaction occurring within the last 10 years: Unknown If all of the above answers are "NO", then may proceed with Cephalosporin use.  Happened in childhood    Family History  Problem Relation Age of Onset  . Diabetes Mellitus II Mother       Prior to Admission medications   Medication Sig Start Date End Date Taking? Authorizing Provider  atorvastatin (LIPITOR) 40 MG  tablet As directed 08/29/15  Yes [provider]  ferrous sulfate 325 (65 FE) MG tablet Take 325 mg by mouth 2 (two) times daily.   Yes [provider]  insulin lispro (HUMALOG KWIKPEN) 100 UNIT/ML KiwkPen Inject into the skin. 10 units before meals if CBG greater than 350; GIVE 10 UNITS  IF BS  GREATER THAN 350. Give 3 units with lunch, if CBG greater than 350 give additional 7 units. Give 5 units with dinner, if CBG is great than 350 give 5 additional units to equal 10 units.   Yes [provider]  lisinopril (PRINIVIL,ZESTRIL) 20 MG tablet Take 1 tablet (20 mg total) by mouth daily. 06/15/17  Yes [name], MD  acetaminophen (TYLENOL) 325 MG tablet Take 2 tablets (650 mg total) by mouth every 6 (six) hours as needed for mild pain or fever. 06/18/17   [name], MD  insulin glargine (LANTUS) 100 UNIT/ML injection Inject 0.1 mLs (10 Units total) into the skin daily. Patient not taking: Reported on 11/21/2017 06/18/17   [name], MD  metFORMIN (GLUCOPHAGE) 500 MG tablet Take 500 mg by mouth daily. 11/12/17   [provider]    Physical Exam: BP (!) 114/54   Pulse (!) 53   Temp (!) 97.2 F (36.2 C) (Oral)   Resp 17   SpO2 100%   General: Lying in bed wrapped in covers, in no apparent distress, pleasant in conversation HEENT: Scleral icterus, dry oral mucosa Cardiovascular: Bradycardic, no appreciable murmurs, rubs, or gallops, regular rhythm, no peripheral edema Respiratory: On room air, clear breath sounds on anterior chest field Gastrointestinal: Soft, nondistended, tenderness to right upper quadrant, no rebound tenderness or guarding, decreased bowel sounds Skin: Jaundiced, tenting Neurologic: Alert, only oriented to person and place.  Unable to state year, does not know why he is in the hospital and cannot remember what happened at his facility         Labs on Admission:  Basic Metabolic Panel: Recent Labs  Lab 11/15/2017 1424  NA 137  K 4.7  CL 102  CO2 15*  GLUCOSE 199*  BUN 90*  CREATININE 10.75*  CALCIUM 7.1*   Liver Function Tests: Recent Labs  Lab 11/13/2017 1424  AST 216*  ALT 164*  ALKPHOS 1,158*  BILITOT 8.4*  PROT 7.2  ALBUMIN 3.1*   Recent Labs  Lab 11/19/2017 1424  LIPASE 175*   No results for input(s):  AMMONIA in the last 168 hours. CBC: Recent Labs  Lab 11/09/2017 1424  WBC 4.4  NEUTROABS 3.2  HGB 10.2*  HCT 29.8*  MCV 89.8  PLT 213   Cardiac Enzymes: Recent Labs  Lab 11/03/2017 1424  TROPONINI 0.29*    BNP (last 3 results) No results for input(s): BNP in the last 8760 hours.  ProBNP (last 3 results) No results for input(s): PROBNP in the last 8760 hours.  CBG: No results for input(s): GLUCAP in the last 168 hours.  Radiological Exams on Admission: Ct Abdomen Pelvis Wo Contrast  Result Date: 11/19/2017 CLINICAL DATA:  84 year old male with history of abdominal pain and poor PO intake over the past 2 days. EXAM: CT ABDOMEN AND PELVIS WITHOUT CONTRAST TECHNIQUE: Multidetector CT imaging of the abdomen and pelvis was performed following the standard protocol without IV contrast. COMPARISON:  CT of the chest, abdomen and pelvis 08/31/2015. FINDINGS: Lower chest: Atherosclerotic calcifications in the left anterior descending, left circumflex and right coronary arteries. Small calcified pleural plaques in the base of the right  hemithorax. No definite calcified pleural plaques noted in the visualized left hemithorax. Hepatobiliary: No definite cystic or solid hepatic lesions are noted on today's noncontrast CT examination. Amorphous intermediate attenuation lying dependently in the gallbladder, presumably biliary sludge. No surrounding inflammatory changes are noted to suggest an acute cholecystitis at this time. Pancreas: No definite pancreatic mass or peripancreatic inflammatory changes on today's noncontrast CT examination. Spleen: Unremarkable. Adrenals/Urinary Tract: Low-attenuation lesions in both kidneys, incompletely characterized on today's noncontrast CT examination, but previously characterized as cysts. The largest of these is exophytic in the upper pole the left kidney measuring 7.5 x 8.6 cm, and demonstrates some peripheral calcifications in the wall, similar to the prior study.  Bilateral adrenal glands are normal in appearance. No hydroureteronephrosis. Urinary bladder is normal in appearance. Stomach/Bowel: Unenhanced appearance of the stomach is normal. No pathologic dilatation of small bowel or colon. Status post right hemicolectomy. Vascular/Lymphatic: Aortic atherosclerosis, without evidence of aneurysm in the abdominal or pelvic vasculature. No lymphadenopathy noted in the abdomen or pelvis on today's noncontrast CT examination. Reproductive: Fiducial markers adjacent to the prostate gland. Prostate gland and seminal vesicles are otherwise unremarkable in appearance. Other: No significant volume of ascites.  No pneumoperitoneum. Musculoskeletal: There are no aggressive appearing lytic or blastic lesions noted in the visualized portions of the skeleton. IMPRESSION: 1. No definite acute findings are noted in the abdomen or pelvis to account for the patient's symptoms. 2. Aortic atherosclerosis, in addition to least 3 vessel coronary artery disease. Please note that although the presence of coronary artery calcium documents the presence of coronary artery disease, the severity of this disease and any potential stenosis cannot be assessed on this non-gated CT examination. Assessment for potential risk factor modification, dietary therapy or pharmacologic therapy may be warranted, if clinically indicated. 3. Calcified pleural plaques in the base of the right hemithorax. Left-sided calcified pleural plaques were previously noted on chest CT 08/31/2015. 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 4. Additional incidental findings, as above. Aortic Atherosclerosis (ICD10-I70.0). Electronically Signed   By: Vinnie Langton M.D.   On: 11/19/2017 16:26   Dg Chest 2 View  Result Date: 10/28/2017 CLINICAL DATA:  Abdominal pain. EXAM: CHEST  2 VIEW COMPARISON:  CT 08/31/2015. FINDINGS: Mediastinum hilar structures normal. Lungs are clear. No pleural  effusion or pneumothorax. Costophrenic angles incompletely imaged. Heart size normal. No acute bony abnormality. IMPRESSION: No acute cardiopulmonary disease. Electronically Signed   By: Marcello Moores  Register   On: 11/07/2017 14:24    EKG: Independently reviewed.  Sinus bradycardia, no acute ischemic changes. Assessment/Plan Present on Admission: . AKI (acute kidney injury) ([place]) . Transaminitis . Hyperbilirubinemia . Abdominal pain . Type II diabetes mellitus with renal manifestations ([employer]) . HLD (hyperlipidemia) . Hypertension . Malignant neoplasm of cecum (H[facility])  Active Problems:   Malignant neoplasm of cecum (H[facility])   Hypertension   Type II diabetes mellitus with renal manifestations ([facility])   HLD (hyperlipidemia)   AKI (acute kidney injury) ([facility])   Transaminitis   Hyperbilirubinemia   Abdominal pain  Right upper quadrant abdominal pain, stable Unclear etiology currently.  CT  noncontrast shows presumable biliary sludge.  Lipase greater than 3 times upper limit of normal (175) makes pancreatitis very likely, interestingly CT abdomen shows no inflammatory changes (?  Limited by no contrast). I'm still worried for this especially with elevated LFTs/bilirubin/alk phos making biliary pancreatitis very likely. Otherwise no acute findings on abdominal imaging.  Patient unable to characterize abdominal pain , though  on exam does show right upper quadrant in location being the most tender. Another consideration is mesenteric ischemia, thought less likely with lactic acid wnl, exam not discordant with complaint as patient currently has no abdominal pain -Follow-up complete abdominal ultrasound (evaluate liver/gallbladder and kidney given significant AKI) -Continue supportive care IV fluids, antiemetics--- this will help treat any insidious pancreatitis as well  AKI on CKD, unclear etiology, suspect related to prerenal/dehydration Baseline creatinine 1-1.2.  Seen in 05/2017 with a creatinine of 1.   Decreased appetite/hydration could lead to prerenal insufficiency.  Given abdominal pain in patient with history of prostate cancer post renal obstruction/urinary retention also possible.  CT non-con showed no intra-abdominal abnormalities, however limited since without contrast. -Follow-up UA (r/o intraglomerular d/s) - Strict intake and output, nursing bladder scan, Place Foley - Avoid nephrotoxins (holding home ACEI) -Obtain FENa, prot/crt -Timed IV fluid, monitor BMP for improvement - Obtain complete abdominal ultrasound (evaluate kidneys and liver was ( -If above lab workup is unrevealing patient will likely benefit formal nephrology consultation and labwork for more specific etiologies of AKI including SPEP, UPEP, PTH, C3, C4  Transaminitis, hyperbilirubinemia, elevated alk phos, unclear etiology In the setting of abdominal pain concerning for potential gallbladder etiology.  Especially considering elevated lipase as well.  Presently no concerning findings pancreas on CT (though limited by no contrast) differential would include cholecystitis, choledocholithiasis, acalculous cholecystitis (though expect patient to be much sicker) or possible malignancy. Wouldn't expect all of above findings with statin use alone.  Would expect much higher LFTs and acute hepatitis,?  DILI(drug induced liver injury- only statin I see on med rec) -Continue to trend CMP -Obtain complete abdominal ultrasound as mentioned above for further evaluation of both kidneys and liver/gallbladder -Hepatitis panel -INR  Elevated troponin Denies any chest pain, however abdominal pain can be atypical presentation of cardiac disease.  EKG shows no acute ischemic changes.  Though we have other more likely etiologies to explain abdominal pain -We will continue troponin cycle -Continue to monitor on telemetry  Hypertension, controlled -Holding home lisinopril in setting of AKI -Continue to monitor blood  pressure  Hyperlipidemia - Hold home statin in the setting of transaminitis -Continue monitoring CMP   DVT prophylaxis:  Subcutaneous Heparin   Code Status: Full Code  Family Communication: No family at bedside  Disposition Plan: monitoring kidney function, f/u abd ultrasound  Consults called: None   Admission status: Admitted as inpatient     Desiree Hane MD [facility]  Pager [phone]  If 7PM-7AM, please contact night-coverage [email or web address] Password [facility]  11/02/2017, 6:53 PM

## 2017-11-20 NOTE — ED Provider Notes (Signed)
[place] DEPT Provider Note   CSN: [id number] Arrival date & time: 11/07/2017  1334     History   Chief Complaint Chief Complaint  Patient presents with  . Abdominal Pain    HPI [name] is a 84 y.o. male.  The history is provided by the patient, the EMS personnel and the nursing home. The history is limited by the condition of the patient (Hx dementia).  Abdominal Pain    Pt was seen at 1355. Per EMS, NH report and pt: Pt with c/o abd pain and poor PO intake x2 days. EMS states pt was orthostatic on scene with SBP 92 lying and SBP 72 sitting. IVF bolus given en route. Pt himself has hx of dementia and is unclear why he is here today.   Past Medical History:  Diagnosis Date  . Acute metabolic encephalopathy 5/62/1308  . CKD (chronic kidney disease), stage III ([place]) 06/14/2017  . Colon cancer ([place])    colon ca dx 7/11  . Dementia without behavioral disturbance 06/26/2017  . Diabetes mellitus   . Diabetic nephropathy ([place])   . History of stroke 06/26/2017  . Hyperlipemia   . Hypertension   . Iron deficiency anemia 06/26/2017  . Malignant neoplasm of cecum ([place]) 08/27/2011  . MI (myocardial infarction) ([place])   . 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 Prostate CA [facility])    prostate  . Stroke ([place])   . Type II diabetes mellitus with renal manifestations ([place]) 06/05/2012    Patient Active Problem List   Diagnosis Date Noted  . 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 Dementia without behavioral disturbance 06/26/2017  . History of stroke 06/26/2017  . Iron deficiency anemia 06/26/2017  . Acute metabolic encephalopathy 65/78/4696  . CKD (chronic kidney disease), stage III ([place]) 06/14/2017  . HLD (hyperlipidemia) 06/14/2017  . Stroke ([place])   . Hypertension 06/05/2012  . Type II diabetes mellitus with renal manifestations ([place]) 06/05/2012  . Malignant neoplasm  of cecum ([place]) 08/27/2011    Past Surgical History:  Procedure Laterality Date  . COLON SURGERY  06/2010   right colectomy, T3N2  . PORT-A-CATH REMOVAL N/A 10/19/2014   Procedure: MINOR REMOVAL PORT-A-CATH;  Surgeon: Autumn Messing III, MD;  Location: [place];  Service: General;  Laterality: N/A;  . PROSTATE SURGERY         Home Medications    Prior to Admission medications   Medication Sig Start Date End Date Taking? Authorizing Provider  acetaminophen (TYLENOL) 325 MG tablet Take 2 tablets (650 mg total) by mouth every 6 (six) hours as needed for mild pain or fever. 06/18/17   [name], MD  atorvastatin (LIPITOR) 40 MG tablet As directed 08/29/15   [provider]  ferrous sulfate 325 (65 FE) MG tablet Take 325 mg by mouth 2 (two) times daily.    [provider]  glimepiride (AMARYL) 4 MG tablet Take 4 mg by mouth 2 (two) times daily.  08/30/11   [provider]  insulin glargine (LANTUS) 100 UNIT/ML injection Inject 0.1 mLs (10 Units total) into the skin daily. 06/18/17   [name], MD  insulin lispro (HUMALOG KWIKPEN) 100 UNIT/ML KiwkPen Inject into the skin. 10 units before meals if CBG greater than 350; Give 3 units with lunch, if CBG greater than 350 give additional 7 units. Give 5 units with dinner, if CBG is great than 350 give 5 additional units  to equal 10 units.    [provider]  lisinopril (PRINIVIL,ZESTRIL) 20 MG tablet Take 1 tablet (20 mg total) by mouth daily. 06/15/17   [name], MD  metFORMIN (GLUCOPHAGE) 1000 MG tablet Take 1,000 mg by mouth 2 (two) times daily with a meal.    [provider]    Family History Family History  Problem Relation Age of Onset  . Diabetes Mellitus II Mother     Social History Social History   Tobacco Use  . Smoking status: Former Smoker    Packs/day: 0.25    Years: 3.00    Pack years: 0.75    Types: Cigarettes  . Smokeless tobacco: Never Used    Substance Use Topics  . Alcohol use: No  . Drug use: No     Allergies   Penicillins   Review of Systems Review of Systems  Unable to perform ROS: Dementia  Gastrointestinal: Positive for abdominal pain.     Physical Exam Updated Vital Signs BP 119/64 (BP Location: Right Arm)   Pulse (!) 50   Temp (!) 97.2 F (36.2 C) (Oral)   Resp 16   SpO2 100%   Physical Exam 1400: Physical examination:  Nursing notes reviewed; Vital signs and O2 SAT reviewed;  Constitutional: Well developed, Well nourished, In no acute distress; Head:  Normocephalic, atraumatic; Eyes: EOMI, PERRL, No scleral icterus; ENMT: Mouth and pharynx normal, Mucous membranes dry; Neck: Supple, Full range of motion, No lymphadenopathy; Cardiovascular: Regular rate and rhythm, No gallop; Respiratory: Breath sounds clear & equal bilaterally, No wheezes.  Speaking full sentences with ease, Normal respiratory effort/excursion; Chest: Nontender, Movement normal; Abdomen: Soft, +generalized tenderness to palp.  Nondistended, Normal bowel sounds; Genitourinary: No CVA tenderness; Extremities: Pulses normal, No tenderness, No edema, No calf edema or asymmetry.; Neuro: Awake, alert, confused per hx dementia. Major CN grossly intact. No facial droop. Speech clear. Pt moves all extremities spontaneously and to command without apparent gross focal motor deficits.; Skin: Color normal, Warm, Dry.    ED Treatments / Results  Labs (all labs ordered are listed, but only abnormal results are displayed)   EKG  EKG Interpretation  Date/Time:  Tuesday November 20 2017 15:02:57 EST Ventricular Rate:  53 PR Interval:    QRS Duration: 86 QT Interval:  490 QTC Calculation: 461 R Axis:   85 Text Interpretation:  Sinus rhythm Borderline right axis deviation Low voltage, precordial leads Probable anteroseptal infarct, old Baseline wander When compared with ECG of 06/14/2017 No significant change was found Confirmed by Francine Graven  [phone]) on 11/04/2017 3:18:49 PM       Radiology   Procedures Procedures (including critical care time)  Medications Ordered in ED Medications - No data to display   Initial Impression / Assessment and Plan / ED Course  I have reviewed the triage vital signs and the nursing notes.  Pertinent labs & imaging results that were available during my care of the patient were reviewed by me and considered in my medical decision making (see chart for details).  MDM Reviewed: previous chart, nursing note and vitals Reviewed previous: labs and ECG Interpretation: labs, ECG and x-ray   Results for orders placed or performed during the hospital encounter of 11/18/2017  Comprehensive metabolic panel  Result Value Ref Range   Sodium 137 135 - 145 mmol/L   Potassium 4.7 3.5 - 5.1 mmol/L   Chloride 102 101 - 111 mmol/L   CO2 15 (L) 22 - 32 mmol/L   Glucose,  Bld 199 (H) 65 - 99 mg/dL   BUN 90 (H) 6 - 20 mg/dL   Creatinine, Ser 10.75 (H) 0.61 - 1.24 mg/dL   Calcium 7.1 (L) 8.9 - 10.3 mg/dL   Total Protein 7.2 6.5 - 8.1 g/dL   Albumin 3.1 (L) 3.5 - 5.0 g/dL   AST 216 (H) 15 - 41 U/L   ALT 164 (H) 17 - 63 U/L   Alkaline Phosphatase 1,158 (H) 38 - 126 U/L   Total Bilirubin 8.4 (H) 0.3 - 1.2 mg/dL   GFR calc non Af Amer 4 (L) >60 mL/min   GFR calc Af Amer 4 (L) >60 mL/min   Anion gap 20 (H) 5 - 15  Lipase, blood  Result Value Ref Range   Lipase 175 (H) 11 - 51 U/L  CBC with Differential  Result Value Ref Range   WBC 4.4 4.0 - 10.5 K/uL   RBC 3.32 (L) 4.22 - 5.81 MIL/uL   Hemoglobin 10.2 (L) 13.0 - 17.0 g/dL   HCT 29.8 (L) 39.0 - 52.0 %   MCV 89.8 78.0 - 100.0 fL   MCH 30.7 26.0 - 34.0 pg   MCHC 34.2 30.0 - 36.0 g/dL   RDW 16.5 (H) 11.5 - 15.5 %   Platelets 213 150 - 400 K/uL   Neutrophils Relative % 72 %   Neutro Abs 3.2 1.7 - 7.7 K/uL   Lymphocytes Relative 12 %   Lymphs Abs 0.6 (L) 0.7 - 4.0 K/uL   Monocytes Relative 14 %   Monocytes Absolute 0.6 0.1 - 1.0 K/uL   Eosinophils  Relative 1 %   Eosinophils Absolute 0.0 0.0 - 0.7 K/uL   Basophils Relative 1 %   Basophils Absolute 0.0 0.0 - 0.1 K/uL  Lactic acid, plasma  Result Value Ref Range   Lactic Acid, Venous 1.7 0.5 - 1.9 mmol/L  Troponin I  Result Value Ref Range   Troponin I 0.29 (HH) <0.03 ng/mL   Dg Chest 2 View Result Date: 11/15/2017 CLINICAL DATA:  Abdominal pain. EXAM: CHEST  2 VIEW COMPARISON:  CT 08/31/2015. FINDINGS: Mediastinum hilar structures normal. Lungs are clear. No pleural effusion or pneumothorax. Costophrenic angles incompletely imaged. Heart size normal. No acute bony abnormality. IMPRESSION: No acute cardiopulmonary disease. Electronically Signed   By: Marcello Moores  Register   On: 11/11/2017 14:24   Results for KHALEB, BROZ (MRN [id number]) as of 11/10/2017 15:52  Ref. Range 06/15/2017 06:32 06/17/2017 06:18 06/22/2017 00:00 11/07/2017 14:24  Hemoglobin Latest Ref Range: 13.0 - 17.0 g/dL 10.2 (L) 11.0 (L) 11.3 (A) 10.2 (L)  HCT Latest Ref Range: 39.0 - 52.0 % 30.2 (L) 31.9 (L) 34 (A) 29.8 (L)   Results for DAQWAN, DOUGAL (MRN [id number]) as of 10/24/2017 15:52  Ref. Range 06/14/2017 18:10 06/15/2017 06:32 06/17/2017 06:18 06/22/2017 00:00 11/10/2017 14:24  BUN Latest Ref Range: 6 - 20 mg/dL 27 (H) 18 15 22  (A) 90 (H)  Creatinine Latest Ref Range: 0.61 - 1.24 mg/dL 1.32 (H) 0.97 1.03 1.0 10.75 (H)    1555:  H/H c/w previous above. New lipase and LFT elevation. New AKI. Troponin elevated, but EKG grossly unchanged from previous. Bladder scan, UA, CT A/P pending. Will need admit. Sign out to Dr. Francia Greaves.     Final Clinical Impressions(s) / ED Diagnoses   Final diagnoses:  None    ED Discharge Orders    None        Francine Graven, DO 11/17/2017 1557

## 2017-11-21 ENCOUNTER — Inpatient Hospital Stay (HOSPITAL_COMMUNITY): Payer: Medicare Other

## 2017-11-21 ENCOUNTER — Other Ambulatory Visit: Payer: Self-pay

## 2017-11-21 DIAGNOSIS — K859 Acute pancreatitis without necrosis or infection, unspecified: Secondary | ICD-10-CM

## 2017-11-21 DIAGNOSIS — N186 End stage renal disease: Secondary | ICD-10-CM

## 2017-11-21 DIAGNOSIS — R748 Abnormal levels of other serum enzymes: Secondary | ICD-10-CM

## 2017-11-21 LAB — COMPREHENSIVE METABOLIC PANEL
ALBUMIN: 2.9 g/dL — AB (ref 3.5–5.0)
ALK PHOS: 1089 U/L — AB (ref 38–126)
ALT: 152 U/L — ABNORMAL HIGH (ref 17–63)
ANION GAP: 20 — AB (ref 5–15)
AST: 203 U/L — ABNORMAL HIGH (ref 15–41)
BUN: 90 mg/dL — ABNORMAL HIGH (ref 6–20)
CO2: 15 mmol/L — AB (ref 22–32)
Calcium: 6.8 mg/dL — ABNORMAL LOW (ref 8.9–10.3)
Chloride: 104 mmol/L (ref 101–111)
Creatinine, Ser: 10.74 mg/dL — ABNORMAL HIGH (ref 0.61–1.24)
GFR calc Af Amer: 4 mL/min — ABNORMAL LOW (ref >60)
GFR calc non Af Amer: 4 mL/min — ABNORMAL LOW (ref >60)
GLUCOSE: 192 mg/dL — AB (ref 65–99)
POTASSIUM: 4.4 mmol/L (ref 3.5–5.1)
Sodium: 139 mmol/L (ref 135–145)
Total Bilirubin: 8.2 mg/dL — ABNORMAL HIGH (ref 0.3–1.2)
Total Protein: 6.9 g/dL (ref 6.5–8.1)

## 2017-11-21 LAB — URINALYSIS, COMPLETE (UACMP) WITH MICROSCOPIC
BILIRUBIN URINE: NEGATIVE
Glucose, UA: 50 mg/dL — AB
KETONES UR: NEGATIVE mg/dL
LEUKOCYTES UA: NEGATIVE
Nitrite: NEGATIVE
Protein, ur: >=300 mg/dL — AB
SPECIFIC GRAVITY, URINE: 1.025 (ref 1.005–1.030)
Squamous Epithelial / LPF: NONE SEEN
pH: 5 (ref 5.0–8.0)

## 2017-11-21 LAB — MRSA PCR SCREENING: MRSA by PCR: NEGATIVE

## 2017-11-21 LAB — CBC
HEMATOCRIT: 27.4 % — AB (ref 39.0–52.0)
HEMOGLOBIN: 9.7 g/dL — AB (ref 13.0–17.0)
MCH: 31 pg (ref 26.0–34.0)
MCHC: 35.4 g/dL (ref 30.0–36.0)
MCV: 87.5 fL (ref 78.0–100.0)
Platelets: 197 10*3/uL (ref 150–400)
RBC: 3.13 MIL/uL — ABNORMAL LOW (ref 4.22–5.81)
RDW: 15.6 % — ABNORMAL HIGH (ref 11.5–15.5)
WBC: 4.3 10*3/uL (ref 4.0–10.5)

## 2017-11-21 LAB — TROPONIN I
TROPONIN I: 0.25 ng/mL — AB (ref <0.03)
Troponin I: 0.25 ng/mL (ref <0.03)

## 2017-11-21 LAB — NA AND K (SODIUM & POTASSIUM), RAND UR
Potassium Urine: 34 mmol/L
Sodium, Ur: 38 mmol/L

## 2017-11-21 LAB — PROTEIN / CREATININE RATIO, URINE
CREATININE, URINE: 236 mg/dL
TOTAL PROTEIN, URINE: 1106 mg/dL

## 2017-11-21 LAB — CREATININE, URINE, RANDOM: CREATININE, URINE: 236.81 mg/dL

## 2017-11-21 MED ORDER — SODIUM BICARBONATE 8.4 % IV SOLN
INTRAVENOUS | Status: DC
  Administered 2017-11-21 – 2017-11-23 (×4): via INTRAVENOUS
  Filled 2017-11-21 (×7): qty 850

## 2017-11-21 MED ORDER — SODIUM CHLORIDE 0.9 % IV SOLN
INTRAVENOUS | Status: DC
  Administered 2017-11-21: 09:00:00 via INTRAVENOUS

## 2017-11-21 MED ORDER — SODIUM BICARBONATE 8.4 % IV SOLN
50.0000 meq | Freq: Once | INTRAVENOUS | Status: AC
  Administered 2017-11-21: 50 meq via INTRAVENOUS
  Filled 2017-11-21: qty 50

## 2017-11-21 MED ORDER — SODIUM BICARBONATE 8.4 % IV SOLN
INTRAVENOUS | Status: DC
  Filled 2017-11-21: qty 1000

## 2017-11-21 NOTE — Progress Notes (Signed)
Pt has decreased UOP during the night, pt urine is dark and concentrated. Night bladder scanned pt and 55 cc noted, stated 100-125cc urine for 12hrs. SRP, RN

## 2017-11-21 NOTE — Consult Note (Addendum)
Reason for Consult:Gallstone pancreatitis Referring Physician: [facility]  [name] HPI: This is an 84 year old male with a PMH of a T3N2 colon cancer on 04/2010 s/p right hemicolectomy, dementia, CVA, DM, and renal failure admitted with a gallstone pancreatitis.  The patient lives in a facility and he was noted to have abdominal complaints and a poor PO intake.  As his symptoms continued to persist and worsen he was admitted to the hospital.  In the ER his AP was noted to be at 1158, ALT 164, AST 216, and lipase at 175.  His TB is at 8.2A CT scan was positive for cholelithiasis, but there was no comment on biliary ductal dilation.  Today he states that his pain has essentially resolved and he is feeling well.  His creatinine is also markedly elevated at 10./75 and his baseline is in the low 1 range.    Past Medical History:  Diagnosis Date  . Acute metabolic encephalopathy 03/19/7823  . CKD (chronic kidney disease), stage III ([place]) 06/14/2017  . Colon cancer ([place])    colon ca dx 7/11  . Dementia without behavioral disturbance 06/26/2017  . Diabetes mellitus   . Diabetic nephropathy ([place])   . History of stroke 06/26/2017  . Hyperlipemia   . Hypertension   . Iron deficiency anemia 06/26/2017  . Malignant neoplasm of cecum ([place]) 08/27/2011  . MI (myocardial infarction) ([place])   . 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 Prostate CA [facility])    prostate  . Stroke ([place])   . Type II diabetes mellitus with renal manifestations ([place]) 06/05/2012    Past Surgical History:  Procedure Laterality Date  . COLON SURGERY  06/2010   right colectomy, T3N2  . PORT-A-CATH REMOVAL N/A 10/19/2014   Procedure: MINOR REMOVAL PORT-A-CATH;  Surgeon: Autumn Messing III, MD;  Location: [facility];  Service: General;  Laterality: N/A;  . PROSTATE SURGERY      Family History  Problem Relation Age of Onset  . Diabetes Mellitus II Mother     Social History:   reports that he has quit smoking. His smoking use included cigarettes. He has a 0.75 pack-year smoking history. he has never used smokeless tobacco. He reports that he does not drink alcohol or use drugs.  Allergies:  Allergies  Allergen Reactions  . Penicillins Swelling    Has patient had a PCN reaction causing immediate rash, facial/tongue/throat swelling, SOB or lightheadedness with hypotension: Unknown Has patient had a PCN reaction causing severe rash involving mucus membranes or skin necrosis: Unknown Has patient had a PCN reaction that required hospitalization: Unknown Has patient had a PCN reaction occurring within the last 10 years: Unknown If all of the above answers are "NO", then may proceed with Cephalosporin use.  Happened in childhood    Medications:  Scheduled: . heparin  5,000 Units Subcutaneous Q8H   Continuous: . dextrose 5 % 850 mL with sodium bicarbonate 150 mEq infusion 125 mL/hr at 11/21/17 1713    Results for orders placed or performed during the hospital encounter of 11/03/2017 (from the past 24 hour(s))  Protime-INR     Status: Abnormal   Collection Time: 11/08/2017  7:32 PM  Result Value Ref Range   Prothrombin Time 15.9 (H) 11.4 - 15.2 seconds   INR 1.28   Troponin I (q 6hr x 3)     Status: Abnormal   Collection Time: 10/25/2017  7:32 PM  Result Value Ref Range  Troponin I 0.26 (HH) <0.03 ng/mL  Comprehensive metabolic panel     Status: Abnormal   Collection Time: 11/21/17  1:09 AM  Result Value Ref Range   Sodium 139 135 - 145 mmol/L   Potassium 4.4 3.5 - 5.1 mmol/L   Chloride 104 101 - 111 mmol/L   CO2 15 (L) 22 - 32 mmol/L   Glucose, Bld 192 (H) 65 - 99 mg/dL   BUN 90 (H) 6 - 20 mg/dL   Creatinine, Ser 10.74 (H) 0.61 - 1.24 mg/dL   Calcium 6.8 (L) 8.9 - 10.3 mg/dL   Total Protein 6.9 6.5 - 8.1 g/dL   Albumin 2.9 (L) 3.5 - 5.0 g/dL   AST 203 (H) 15 - 41 U/L   ALT 152 (H) 17 - 63 U/L   Alkaline Phosphatase 1,089 (H) 38 - 126 U/L   Total  Bilirubin 8.2 (H) 0.3 - 1.2 mg/dL   GFR calc non Af Amer 4 (L) >60 mL/min   GFR calc Af Amer 4 (L) >60 mL/min   Anion gap 20 (H) 5 - 15  CBC     Status: Abnormal   Collection Time: 11/21/17  1:09 AM  Result Value Ref Range   WBC 4.3 4.0 - 10.5 K/uL   RBC 3.13 (L) 4.22 - 5.81 MIL/uL   Hemoglobin 9.7 (L) 13.0 - 17.0 g/dL   HCT 27.4 (L) 39.0 - 52.0 %   MCV 87.5 78.0 - 100.0 fL   MCH 31.0 26.0 - 34.0 pg   MCHC 35.4 30.0 - 36.0 g/dL   RDW 15.6 (H) 11.5 - 15.5 %   Platelets 197 150 - 400 K/uL  Troponin I (q 6hr x 3)     Status: Abnormal   Collection Time: 11/21/17  1:09 AM  Result Value Ref Range   Troponin I 0.25 (HH) <0.03 ng/mL  Urinalysis, Complete w Microscopic     Status: Abnormal   Collection Time: 11/21/17  6:02 AM  Result Value Ref Range   Color, Urine AMBER (A) YELLOW   APPearance CLOUDY (A) CLEAR   Specific Gravity, Urine 1.025 1.005 - 1.030   pH 5.0 5.0 - 8.0   Glucose, UA 50 (A) NEGATIVE mg/dL   Hgb urine dipstick LARGE (A) NEGATIVE   Bilirubin Urine NEGATIVE NEGATIVE   Ketones, ur NEGATIVE NEGATIVE mg/dL   Protein, ur >=300 (A) NEGATIVE mg/dL   Nitrite NEGATIVE NEGATIVE   Leukocytes, UA NEGATIVE NEGATIVE   RBC / HPF TOO NUMEROUS TO COUNT 0 - 5 RBC/hpf   WBC, UA TOO NUMEROUS TO COUNT 0 - 5 WBC/hpf   Bacteria, UA MANY (A) NONE SEEN   Squamous Epithelial / LPF NONE SEEN NONE SEEN   WBC Clumps PRESENT   Na and K (sodium & potassium), rand urine     Status: None   Collection Time: 11/21/17  6:02 AM  Result Value Ref Range   Sodium, Ur 38 mmol/L   Potassium Urine 34 mmol/L  Creatinine, urine, random     Status: None   Collection Time: 11/21/17  6:02 AM  Result Value Ref Range   Creatinine, Urine 236.81 mg/dL  Protein / creatinine ratio, urine     Status: None   Collection Time: 11/21/17  6:02 AM  Result Value Ref Range   Creatinine, Urine 236.00 mg/dL   Total Protein, Urine 1,106 mg/dL   Protein Creatinine Ratio        0.00 - 0.15 mg/mg[Cre]  MRSA PCR Screening  Status: None   Collection Time: 11/21/17  6:02 AM  Result Value Ref Range   MRSA by PCR NEGATIVE NEGATIVE  Troponin I (q 6hr x 3)     Status: Abnormal   Collection Time: 11/21/17  7:20 AM  Result Value Ref Range   Troponin I 0.25 (HH) <0.03 ng/mL     Ct Abdomen Pelvis Wo Contrast  Result Date: 11/06/2017 CLINICAL DATA:  84 year old male with history of abdominal pain and poor PO intake over the past 2 days. EXAM: CT ABDOMEN AND PELVIS WITHOUT CONTRAST TECHNIQUE: Multidetector CT imaging of the abdomen and pelvis was performed following the standard protocol without IV contrast. COMPARISON:  CT of the chest, abdomen and pelvis 08/31/2015. FINDINGS: Lower chest: Atherosclerotic calcifications in the left anterior descending, left circumflex and right coronary arteries. Small calcified pleural plaques in the base of the right hemithorax. No definite calcified pleural plaques noted in the visualized left hemithorax. Hepatobiliary: No definite cystic or solid hepatic lesions are noted on today's noncontrast CT examination. Amorphous intermediate attenuation lying dependently in the gallbladder, presumably biliary sludge. No surrounding inflammatory changes are noted to suggest an acute cholecystitis at this time. Pancreas: No definite pancreatic mass or peripancreatic inflammatory changes on today's noncontrast CT examination. Spleen: Unremarkable. Adrenals/Urinary Tract: Low-attenuation lesions in both kidneys, incompletely characterized on today's noncontrast CT examination, but previously characterized as cysts. The largest of these is exophytic in the upper pole the left kidney measuring 7.5 x 8.6 cm, and demonstrates some peripheral calcifications in the wall, similar to the prior study. Bilateral adrenal glands are normal in appearance. No hydroureteronephrosis. Urinary bladder is normal in appearance. Stomach/Bowel: Unenhanced appearance of the stomach is normal. No pathologic dilatation of small  bowel or colon. Status post right hemicolectomy. Vascular/Lymphatic: Aortic atherosclerosis, without evidence of aneurysm in the abdominal or pelvic vasculature. No lymphadenopathy noted in the abdomen or pelvis on today's noncontrast CT examination. Reproductive: Fiducial markers adjacent to the prostate gland. Prostate gland and seminal vesicles are otherwise unremarkable in appearance. Other: No significant volume of ascites.  No pneumoperitoneum. Musculoskeletal: There are no aggressive appearing lytic or blastic lesions noted in the visualized portions of the skeleton. IMPRESSION: 1. No definite acute findings are noted in the abdomen or pelvis to account for the patient's symptoms. 2. Aortic atherosclerosis, in addition to least 3 vessel coronary artery disease. Please note that although the presence of coronary artery calcium documents the presence of coronary artery disease, the severity of this disease and any potential stenosis cannot be assessed on this non-gated CT examination. Assessment for potential risk factor modification, dietary therapy or pharmacologic therapy may be warranted, if clinically indicated. 3. Calcified pleural plaques in the base of the right hemithorax. Left-sided calcified pleural plaques were previously noted on chest CT 08/31/2015. 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 4. Additional incidental findings, as above. Aortic Atherosclerosis (ICD10-I70.0). Electronically Signed   By: Vinnie Langton M.D.   On: 10/29/2017 16:26   Dg Chest 2 View  Result Date: 11/13/2017 CLINICAL DATA:  Abdominal pain. EXAM: CHEST  2 VIEW COMPARISON:  CT 08/31/2015. FINDINGS: Mediastinum hilar structures normal. Lungs are clear. No pleural effusion or pneumothorax. Costophrenic angles incompletely imaged. Heart size normal. No acute bony abnormality. IMPRESSION: No acute cardiopulmonary disease. Electronically Signed   By: Marcello Moores  Register   On: 11/13/2017  14:24   US Abdomen Complete  Result Date: 11/16/2017 CLINICAL DATA:  Acute kidney injury, transaminitis, hyperbilirubinemia, jaundice, abdominal pain. EXAM: ABDOMEN  ULTRASOUND COMPLETE COMPARISON:  CT 10/25/2017 FINDINGS: Gallbladder: Gallbladder is filled with sludge. No wall thickening. No visible stones. Negative sonographic Murphy's. Common bile duct: Diameter: Common bile duct is dilated to 11 mm. Sludge seen within the common bile duct. No visible ductal stones. Liver: Intrahepatic biliary ductal dilatation. No visible focal hepatic lesion. Normal echotexture. Portal vein is patent on color Doppler imaging with normal direction of blood flow towards the liver. IVC: No abnormality visualized. Pancreas: Dilated pancreatic duct measuring up to 7 mm. No visible focal pancreatic mass lesion. Spleen: Size and appearance within normal limits. Right Kidney: Length: 11.5 cm. No hydronephrosis. Cysts within the right kidney, measuring up to 4.2 cm. Normal echotexture. Left Kidney: Length: 12.1 cm. Cysts within the left kidney, measuring up to 8.2 cm. No hydronephrosis. Normal echotexture. Abdominal aorta: No aneurysm visualized. Other findings: None. IMPRESSION: Intrahepatic and extrahepatic biliary ductal dilatation. Pancreatic ductal dilatation. Sludge noted within the common bile duct. No visible obstructing stones or visible pancreatic head mass, but the constellation of findings is concerning for ductal occlusion in the region of the pancreatic head. Recommend further evaluation with MRCP or ERCP filled gallbladder. No visible stones. Bilateral renal cysts. Electronically Signed   By: Rolm Baptise M.D.   On: 11/18/2017 20:03    ROS:  As stated above in the HPI otherwise negative.  Blood pressure (!) 106/55, pulse 63, temperature 97.9 F (36.6 C), temperature source Oral, resp. rate 17, height 5\' 9"  (1.753 m), weight 58.1 kg (128 lb), SpO2 96 %.    PE: Gen: NAD, Alert and Oriented HEENT:  [place]/AT,  EOMI Neck: Supple, no LAD Lungs: CTA Bilaterally CV: RRR without M/G/R ABM: Soft, NTND, +BS Ext: No C/C/E  Assessment/Plan: 1) Gallstone pancreatitis. 2) Abnormal liver enzymes. 3) Renal insufficiency.   The patient may have passed a gallstone.  There was no report of dilated biliary ducts with the CT scan, but there is dilation on the ultrasound.  He is improving.  His liver pane is also improving.  The main issue is his renal insufficiency.  Renal evaluated the patient and he will continue to receive IV hydration.    Plan: 1) Follow liver panel. 2) Tentatively scheduled for an ERCP on Friday. 3) Continue with IV hydration.  Guida Asman D 11/21/2017, 5:13 PM

## 2017-11-21 NOTE — Clinical Social Work Note (Addendum)
Spoke with Santiago Glad, resident Freight forwarder at [facility]. She is aware of pt's admission and status. Directed her to inquire of medical staff her specific medical questions about pt's treatment. Will update her once pt's care needs at DC known.  Plan to return to [facility] at DC.  Clinical Social Work Assessment  Patient Details  Name: [name] MRN: [id number] Date of Birth: May 08, 1931  Date of referral:  11/21/17               Reason for consult:  (pt admitted from facility)                Permission sought to share information with:  Family Supports, Chartered certified accountant granted to share information::  Yes, Verbal Permission Granted  Name::     son [name]::  [facility]  Relationship::     Contact Information:     Housing/Transportation Living arrangements for the past 2 months:  [employer] of Information:  Adult Children, Patient Patient Interpreter Needed:  None Criminal Activity/Legal Involvement Pertinent to Current Situation/Hospitalization:  No - Comment as needed Significant Relationships:  Adult Children, Community Support Lives with:  Facility Resident Do you feel safe going back to the place where you live?  Yes Need for family participation in patient care:  Yes (Comment)(pt's sons involved)  Care giving concerns:  Pt admitted from [facility] where he has resided for about 4 months.  Per son, at baseline pt ambulates independently (with assistive devices at times) and needs assistance with taking medication and prompting for ADLs (however he can complete them independently). Has dementia and "usually knows who people are and what is going on but sometimes gets confused." Son and pt report being pleased with care at ALF.   Social Worker assessment / plan:  CSW consulted as pt is admitted from facility- [facility]. Pt and son report plan to return at DC unless pt's care needs have changed but do not anticipate it.   Left voicemail with facility in order to confirm care needs reported by son above. Will need updated FL2 at DC.  Plan: return to ALF once medically stable  Employment status:  Retired Forensic scientist:  Medicare(BCBS supplement) PT Recommendations:  Not assessed at this time Information / Referral to community resources:     Patient/Family's Response to care:  Pt does not remark- son appreciative  Patient/Family's Understanding of and Emotional Response to Diagnosis, Current Treatment, and Prognosis:  Pt demonstrates understanding of where he is and of general situation but not in-depth. Son shows good understanding of status and plan. Emotionally both are fairly flat but appropriate  Emotional Assessment Appearance:  Appears stated age Attitude/Demeanor/Rapport:  (pleasant but short) Affect (typically observed):  Calm Orientation:  Oriented to Self, Oriented to Place Alcohol / Substance use:  Not Applicable Psych involvement (Current and /or in the community):  No (Comment)  Discharge Needs  Concerns to be addressed:  Care Coordination Readmission within the last 30 days:  No Current discharge risk:  None Barriers to Discharge:  Continued Medical Work up   [employer], LCSW 11/21/2017, 11:35 AM [phone]

## 2017-11-21 NOTE — Progress Notes (Signed)
Decreased UOP--bladder scanned 15-17 cc noted. Foley drained 30 cc dark brown tea colored urine noted. MD updated SRP, RN

## 2017-11-21 NOTE — Progress Notes (Signed)
PROGRESS NOTE    [name]  NLG:[id number] DOB: [birth year] DOA: 11/06/2017 PCP: Charolette Forward, MD    Brief Narrative:  84 y.o. male with medical history significant for hypertension, hyperlipidemia, type 2 diabetes, prostate cancer, colorectal cancer status post hemicolectomy, seizures, early dementia who presents on 11/02/2017 with complaints of abdominal pain and decreased appetite was found to have AK I, transaminitis, hyperbilirubinemia of unclear etiology.  [name] is unable to provide much history given his dementia.  He only states that he is noticed some abdominal pain and that he is not eating.  He says his belly pain is "neutral".  He does not specifically endorse a decreased appetite. Per facility report patient has been having abdominal pain with decreased appetite for 2 days.  Patient was noted to be orthostatic at the facility of the lying systolic of 92 which dropped to 72 upon sitting.   ED Course: Patient is afebrile, hemodynamically stable.  Lab workup significant for creatinine of 10.75, BUN 90, lipase 175, AST 216, ALT 164, alk phos 1158, total bilirubin 8.4.  Troponin 0 0.29.  WBC 4.4, hemoglobin 10.5. CT noncontrast was unremarkable. Given  significantly elevated creatinine, patient was admitted to hospitalist service   Assessment & Plan:   Active Problems:   Malignant neoplasm of cecum (HCC)   Hypertension   Type II diabetes mellitus with renal manifestations (HCC)   HLD (hyperlipidemia)   AKI (acute kidney injury) (HCC)   Transaminitis   Hyperbilirubinemia   Abdominal pain  Right upper quadrant abdominal pain, stable -Suspected gallstone pancreatitis. CT abd/pelvis reviewed -LFT's slowly trending down -Abd [place] reviewed. Findings of biliary ductal dilatation including pancreatic duct -Have consulted GI -Continued on IVF hydration  AKI on CKD, unclear etiology, suspect related to prerenal/dehydration -Baseline creatinine 1-1.2.  Seen in 05/2017 with a  creatinine of 1.   -Reported to have decreased appetite -Continue foley cath to monitor I/O - Continue to hold lisinopril per home meds and metformin -Calculated FENa of 1.2 -Patient clinically dehydrated with poor skin turgor, dry membranes. Continue IVF as tolerated -Repeat bmet in AM - Still minimal urine output since admit. Will consult Nephrology for assistance  Transaminitis, hyperbilirubinemia, elevated alk phos, unclear etiology -Per above, CT and [place] reviewed -Have consulted GI -Denies abd pain at present  Elevated troponin -Denies chest pains -Suspect related to above renal failure -Continue to monitor  Hypertension, controlled -Holding home lisinopril in setting of AKI -Continue to monitor blood pressure for now  Hyperlipidemia - Hold home statin in the setting of transaminitis -Repeat LFT in AM  DVT prophylaxis: Heparin subQ Code Status: Full Family Communication: Pt in room, called son over phone Disposition Plan: Uncertain at this time  Consultants:   Nephrology  GI  Procedures:     Antimicrobials: Anti-infectives (From admission, onward)   None       Subjective: Without complaints  Objective: Vitals:   11/12/2017 1800 11/12/2017 1850 11/18/2017 2214 11/21/17 0409  BP: (!) 114/54 139/63 121/61 (!) 93/47  Pulse: (!) 53 (!) 56 66 63  Resp: '17 17 18 20  '$ Temp:  97.8 F (36.6 C) 98 F (36.7 C) 98.4 F (36.9 C)  TempSrc:  Oral Oral Oral  SpO2: 100% 95% 100% 100%  Weight:  57.7 kg (127 lb 4.8 oz)  58.1 kg (128 lb)  Height:  '5\' 9"'$  (1.753 m)      Intake/Output Summary (Last 24 hours) at 11/21/2017 1452 Last data filed at 11/21/2017 1427 Gross per 24 hour  Intake 2309.17 ml  Output 30 ml  Net 2279.17 ml   Filed Weights   11/09/2017 1850 11/21/17 0409  Weight: 57.7 kg (127 lb 4.8 oz) 58.1 kg (128 lb)    Examination:  General exam: Appears calm and comfortable  Respiratory system: Clear to auscultation. Respiratory effort  normal. Cardiovascular system: S1 & S2 heard, RRR. Gastrointestinal system: Abdomen is nondistended, soft and nontender. No organomegaly or masses felt. Normal bowel sounds heard. Central nervous system: Alert and oriented. No focal neurological deficits. Extremities: Symmetric 5 x 5 power. Skin: No rashes, lesions  Psychiatry: Judgement and insight appear normal. Mood & affect appropriate.   Data Reviewed: I have personally reviewed following labs and imaging studies  CBC: Recent Labs  Lab 11/15/2017 1424 11/21/17 0109  WBC 4.4 4.3  NEUTROABS 3.2  --   HGB 10.2* 9.7*  HCT 29.8* 27.4*  MCV 89.8 87.5  PLT 213 267   Basic Metabolic Panel: Recent Labs  Lab 11/18/2017 1424 11/21/17 0109  NA 137 139  K 4.7 4.4  CL 102 104  CO2 15* 15*  GLUCOSE 199* 192*  BUN 90* 90*  CREATININE 10.75* 10.74*  CALCIUM 7.1* 6.8*   GFR: Estimated Creatinine Clearance: 4.2 mL/min (A) (by C-G formula based on SCr of 10.74 mg/dL (H)). Liver Function Tests: Recent Labs  Lab 11/17/2017 1424 11/21/17 0109  AST 216* 203*  ALT 164* 152*  ALKPHOS 1,158* 1,089*  BILITOT 8.4* 8.2*  PROT 7.2 6.9  ALBUMIN 3.1* 2.9*   Recent Labs  Lab 11/07/2017 1424  LIPASE 175*   No results for input(s): AMMONIA in the last 168 hours. Coagulation Profile: Recent Labs  Lab 11/10/2017 1932  INR 1.28   Cardiac Enzymes: Recent Labs  Lab 11/14/2017 1424 10/28/2017 1932 11/21/17 0109 11/21/17 0720  TROPONINI 0.29* 0.26* 0.25* 0.25*   BNP (last 3 results) No results for input(s): PROBNP in the last 8760 hours. HbA1C: No results for input(s): HGBA1C in the last 72 hours. CBG: No results for input(s): GLUCAP in the last 168 hours. Lipid Profile: No results for input(s): CHOL, HDL, LDLCALC, TRIG, CHOLHDL, LDLDIRECT in the last 72 hours. Thyroid Function Tests: No results for input(s): TSH, T4TOTAL, FREET4, T3FREE, THYROIDAB in the last 72 hours. Anemia Panel: No results for input(s): VITAMINB12, FOLATE,  FERRITIN, TIBC, IRON, RETICCTPCT in the last 72 hours. Sepsis Labs: Recent Labs  Lab 10/23/2017 1424  LATICACIDVEN 1.7    Recent Results (from the past 240 hour(s))  MRSA PCR Screening     Status: None   Collection Time: 11/21/17  6:02 AM  Result Value Ref Range Status   MRSA by PCR NEGATIVE NEGATIVE Final    Comment:        The GeneXpert MRSA Assay (FDA approved for NASAL specimens only), is one component of a comprehensive MRSA colonization surveillance program. It is not intended to diagnose MRSA infection nor to guide or monitor treatment for MRSA infections.      Radiology Studies: Ct Abdomen Pelvis Wo Contrast  Result Date: 11/17/2017 CLINICAL DATA:  84 year old male with history of abdominal pain and poor PO intake over the past 2 days. EXAM: CT ABDOMEN AND PELVIS WITHOUT CONTRAST TECHNIQUE: Multidetector CT imaging of the abdomen and pelvis was performed following the standard protocol without IV contrast. COMPARISON:  CT of the chest, abdomen and pelvis 08/31/2015. FINDINGS: Lower chest: Atherosclerotic calcifications in the left anterior descending, left circumflex and right coronary arteries. Small calcified pleural plaques in the base of the right  hemithorax. No definite calcified pleural plaques noted in the visualized left hemithorax. Hepatobiliary: No definite cystic or solid hepatic lesions are noted on today's noncontrast CT examination. Amorphous intermediate attenuation lying dependently in the gallbladder, presumably biliary sludge. No surrounding inflammatory changes are noted to suggest an acute cholecystitis at this time. Pancreas: No definite pancreatic mass or peripancreatic inflammatory changes on today's noncontrast CT examination. Spleen: Unremarkable. Adrenals/Urinary Tract: Low-attenuation lesions in both kidneys, incompletely characterized on today's noncontrast CT examination, but previously characterized as cysts. The largest of these is exophytic in the  upper pole the left kidney measuring 7.5 x 8.6 cm, and demonstrates some peripheral calcifications in the wall, similar to the prior study. Bilateral adrenal glands are normal in appearance. No hydroureteronephrosis. Urinary bladder is normal in appearance. Stomach/Bowel: Unenhanced appearance of the stomach is normal. No pathologic dilatation of small bowel or colon. Status post right hemicolectomy. Vascular/Lymphatic: Aortic atherosclerosis, without evidence of aneurysm in the abdominal or pelvic vasculature. No lymphadenopathy noted in the abdomen or pelvis on today's noncontrast CT examination. Reproductive: Fiducial markers adjacent to the prostate gland. Prostate gland and seminal vesicles are otherwise unremarkable in appearance. Other: No significant volume of ascites.  No pneumoperitoneum. Musculoskeletal: There are no aggressive appearing lytic or blastic lesions noted in the visualized portions of the skeleton. IMPRESSION: 1. No definite acute findings are noted in the abdomen or pelvis to account for the patient's symptoms. 2. Aortic atherosclerosis, in addition to least 3 vessel coronary artery disease. Please note that although the presence of coronary artery calcium documents the presence of coronary artery disease, the severity of this disease and any potential stenosis cannot be assessed on this non-gated CT examination. Assessment for potential risk factor modification, dietary therapy or pharmacologic therapy may be warranted, if clinically indicated. 3. Calcified pleural plaques in the base of the right hemithorax. Left-sided calcified pleural plaques were previously noted on chest CT 08/31/2015. 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 4. Additional incidental findings, as above. Aortic Atherosclerosis (ICD10-I70.0). Electronically Signed   By: Vinnie Langton M.D.   On: 10/31/2017 16:26   Dg Chest 2 View  Result Date: 11/06/2017 CLINICAL DATA:   Abdominal pain. EXAM: CHEST  2 VIEW COMPARISON:  CT 08/31/2015. FINDINGS: Mediastinum hilar structures normal. Lungs are clear. No pleural effusion or pneumothorax. Costophrenic angles incompletely imaged. Heart size normal. No acute bony abnormality. IMPRESSION: No acute cardiopulmonary disease. Electronically Signed   By: Marcello Moores  Register   On: 11/13/2017 14:24   US Abdomen Complete  Result Date: 11/15/2017 CLINICAL DATA:  Acute kidney injury, transaminitis, hyperbilirubinemia, jaundice, abdominal pain. EXAM: ABDOMEN ULTRASOUND COMPLETE COMPARISON:  CT 11/03/2017 FINDINGS: Gallbladder: Gallbladder is filled with sludge. No wall thickening. No visible stones. Negative sonographic Murphy's. Common bile duct: Diameter: Common bile duct is dilated to 11 mm. Sludge seen within the common bile duct. No visible ductal stones. Liver: Intrahepatic biliary ductal dilatation. No visible focal hepatic lesion. Normal echotexture. Portal vein is patent on color Doppler imaging with normal direction of blood flow towards the liver. IVC: No abnormality visualized. Pancreas: Dilated pancreatic duct measuring up to 7 mm. No visible focal pancreatic mass lesion. Spleen: Size and appearance within normal limits. Right Kidney: Length: 11.5 cm. No hydronephrosis. Cysts within the right kidney, measuring up to 4.2 cm. Normal echotexture. Left Kidney: Length: 12.1 cm. Cysts within the left kidney, measuring up to 8.2 cm. No hydronephrosis. Normal echotexture. Abdominal aorta: No aneurysm visualized. Other findings: None. IMPRESSION: Intrahepatic and  extrahepatic biliary ductal dilatation. Pancreatic ductal dilatation. Sludge noted within the common bile duct. No visible obstructing stones or visible pancreatic head mass, but the constellation of findings is concerning for ductal occlusion in the region of the pancreatic head. Recommend further evaluation with MRCP or ERCP filled gallbladder. No visible stones. Bilateral renal cysts.  Electronically Signed   By: Rolm Baptise M.D.   On: 11/09/2017 20:03    Scheduled Meds: . heparin  5,000 Units Subcutaneous Q8H   Continuous Infusions: . sodium chloride 100 mL/hr at 11/21/17 0839     LOS: 1 day   Marylu Lund, MD [facility] Pager [phone]  If 7PM-7AM, please contact night-coverage [email or web address] Password [email or web address] 11/21/2017, 2:52 PM

## 2017-11-21 NOTE — Consult Note (Signed)
[facility] Consultation Note Requesting Physician:  [facility] Dr. Wyline Copas Reason for Consult:  Acute renal failure  HPI: The patient is a 84 y.o. year-old with PMH DM, HTN (on ACE), h/o colorectal CA with hemicolectomy in the past, prostate CA, h/o stroke, h/o seizure, and early dementia. Resides at [facility]. Presented with abdominal pain, reduced appetite. Found to have elevated lipase(175), AST (216), ALT (164) , and alk phos (1158) and findings on CT non contrast support dx pf gallstone pancreatitis.   We are asked to see because creatinine elevated at 10.75 (normal 05/2017) and bicarb of 15. Oliguric since admission despite close to 3 liters of fluid. Was noted to be orthostatic at his facility prior to transfer with lying BP of 92, sitting 72. BP has remained soft, 93/47 earlier today. Meds at his facility included lisinopril and metformin. No prior renal hx. FeNa 1.2%  Pt able to tell me where he lives, how many children he has and says lives in the facility because he "couldn't take care of himself". Can't tell me how long appetite has been off. Denies nausea, vomiting, SOB, but has abdominal pain now. Can't tell me about UOP.     Creatinine, Ser  Date/Time Value Ref Range Status  11/21/2017 01:09 AM 10.74 (H) 0.61 - 1.24 mg/dL Final  11/08/2017 02:24 PM 10.75 (H) 0.61 - 1.24 mg/dL Final  06/17/2017 06:18 AM 1.03 0.61 - 1.24 mg/dL Final  06/15/2017 06:32 AM 0.97 0.61 - 1.24 mg/dL Final  06/14/2017 06:10 PM 1.32 (H) 0.61 - 1.24 mg/dL Final  06/14/2017 06:09 PM 1.20 0.61 - 1.24 mg/dL Final    Past Medical History:  Diagnosis Date  . Acute metabolic encephalopathy 2/95/6213  . CKD (chronic kidney disease), stage III ([place]) 06/14/2017  . Colon cancer ([place])    colon ca dx 7/11  . Dementia without behavioral disturbance 06/26/2017  . Diabetes mellitus   . Diabetic nephropathy ([place])   . History of stroke 06/26/2017  . Hyperlipemia   . Hypertension   . Iron deficiency anemia 06/26/2017  . Malignant neoplasm  of cecum ([place]) 08/27/2011  . MI (myocardial infarction) ([place])   . 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 Prostate CA [facility])    prostate  . Stroke ([place])   . Type II diabetes mellitus with renal manifestations ([place]) 06/05/2012     Past Surgical History:  Procedure Laterality Date  . COLON SURGERY  06/2010   right colectomy, T3N2  . PORT-A-CATH REMOVAL N/A 10/19/2014   Procedure: MINOR REMOVAL PORT-A-CATH;  Surgeon: Autumn Messing III, MD;  Location: [place];  Service: General;  Laterality: N/A;  . PROSTATE SURGERY       Family History  Problem Relation Age of Onset  . Diabetes Mellitus II Mother    Social History:  reports that he has quit smoking. His smoking use included cigarettes. He has a 0.75 pack-year smoking history. he has never used smokeless tobacco. He reports that he does not drink alcohol or use drugs.   Allergies  Allergen Reactions  . Penicillins Swelling    Has patient had a PCN reaction causing immediate rash, facial/tongue/throat swelling, SOB or lightheadedness with hypotension: Unknown Has patient had a PCN reaction causing severe rash involving mucus membranes or skin necrosis: Unknown Has patient had a PCN reaction that required hospitalization: Unknown Has patient had a PCN reaction occurring within the last 10 years: Unknown If all of the above answers are "NO", then may proceed with  Cephalosporin use.  Happened in childhood    Home medications: Prior to Admission medications   Medication Sig Start Date End Date Taking? Authorizing Provider  atorvastatin (LIPITOR) 40 MG tablet As directed 08/29/15  Yes [provider]  ferrous sulfate 325 (65 FE) MG tablet Take 325 mg by mouth 2 (two) times daily.   Yes [provider]  insulin lispro (HUMALOG KWIKPEN) 100 UNIT/ML KiwkPen Inject into the skin. 10 units before meals if CBG greater than 350; GIVE 10 UNITS IF BS  GREATER THAN 350. Give 3  units with lunch, if CBG greater than 350 give additional 7 units. Give 5 units with dinner, if CBG is great than 350 give 5 additional units to equal 10 units.   Yes [provider]  lisinopril (PRINIVIL,ZESTRIL) 20 MG tablet Take 1 tablet (20 mg total) by mouth daily. 06/15/17  Yes [name], MD  acetaminophen (TYLENOL) 325 MG tablet Take 2 tablets (650 mg total) by mouth every 6 (six) hours as needed for mild pain or fever. 06/18/17   [name], MD  insulin glargine (LANTUS) 100 UNIT/ML injection Inject 0.1 mLs (10 Units total) into the skin daily. Patient not taking: Reported on 11/05/2017 06/18/17   [name], MD  metFORMIN (GLUCOPHAGE) 500 MG tablet Take 500 mg by mouth daily. 11/12/17   [provider]    Inpatient medications: . heparin  5,000 Units Subcutaneous Q8H    Review of Systems    Physical Exam:  Blood pressure (!) 93/47, pulse 63, temperature 98.4 F (36.9 C), temperature source Oral, resp. rate 20, height _0  (1.753 m), weight 58.1 kg (128 lb), SpO2 100 %.  Small framed light skinned AAM. NAD VS as noted No JVD (neck veins only visible in the supine position) Lungs entirely clear S1S2 No S3 No murmur Abdomen mild distention, pain in RUG with palpation + BS No edema whatsoever Small amount black urine in foley  Labs:  Recent Labs  Lab 11/14/2017 1424 11/21/17 0109  NA 137 139  K 4.7 4.4  CL 102 104  CO2 15* 15*  GLUCOSE 199* 192*  BUN 90* 90*  CREATININE 10.75* 10.74*  CALCIUM 7.1* 6.8*    Recent Labs  Lab 11/21/2017 1424 11/21/17 0109  AST 216* 203*  ALT 164* 152*  ALKPHOS 1,158* 1,089*  BILITOT 8.4* 8.2*  PROT 7.2 6.9  ALBUMIN 3.1* 2.9*   Recent Labs  Lab 11/18/2017 1424  LIPASE 175*    Recent Labs  Lab 10/28/2017 1424 11/21/17 0109  WBC 4.4 4.3  NEUTROABS 3.2  --   HGB 10.2* 9.7*  HCT 29.8* 27.4*  MCV 89.8 87.5  PLT 213 197    Recent Labs  Lab 11/22/2017 1424 11/16/2017 1932 11/21/17 0109  11/21/17 0720  TROPONINI 0.29* 0.26* 0.25* 0.25*   Results for Keith, Gutierrez (MRN [id number]) as of 11/21/2017 16:28  Ref. Range 11/21/2017 06:02  Sodium, Urine Latest Units: mmol/L 38  Creatinine, Urine Latest Units: mg/dL 236.81  FeNa 1.2%  Xrays/Other Studies: Ct Abdomen Pelvis Wo Contrast  Result Date: 11/04/2017 CLINICAL DATA:  84 year old male with history of abdominal pain and poor PO intake over the past 2 days. EXAM: CT ABDOMEN AND PELVIS WITHOUT CONTRAST TECHNIQUE: Multidetector CT imaging of the abdomen and pelvis was performed following the standard protocol without IV contrast. COMPARISON:  CT of the chest, abdomen and pelvis 08/31/2015. FINDINGS: Lower chest: Atherosclerotic calcifications in the left anterior descending, left circumflex and right coronary arteries. Small calcified pleural  plaques in the base of the right hemithorax. No definite calcified pleural plaques noted in the visualized left hemithorax. Hepatobiliary: No definite cystic or solid hepatic lesions are noted on today's noncontrast CT examination. Amorphous intermediate attenuation lying dependently in the gallbladder, presumably biliary sludge. No surrounding inflammatory changes are noted to suggest an acute cholecystitis at this time. Pancreas: No definite pancreatic mass or peripancreatic inflammatory changes on today's noncontrast CT examination. Spleen: Unremarkable. Adrenals/Urinary Tract: Low-attenuation lesions in both kidneys, incompletely characterized on today's noncontrast CT examination, but previously characterized as cysts. The largest of these is exophytic in the upper pole the left kidney measuring 7.5 x 8.6 cm, and demonstrates some peripheral calcifications in the wall, similar to the prior study. Bilateral adrenal glands are normal in appearance. No hydroureteronephrosis. Urinary bladder is normal in appearance. Stomach/Bowel: Unenhanced appearance of the stomach is normal. No pathologic dilatation of  small bowel or colon. Status post right hemicolectomy. Vascular/Lymphatic: Aortic atherosclerosis, without evidence of aneurysm in the abdominal or pelvic vasculature. No lymphadenopathy noted in the abdomen or pelvis on today's noncontrast CT examination. Reproductive: Fiducial markers adjacent to the prostate gland. Prostate gland and seminal vesicles are otherwise unremarkable in appearance. Other: No significant volume of ascites.  No pneumoperitoneum. Musculoskeletal: There are no aggressive appearing lytic or blastic lesions noted in the visualized portions of the skeleton. IMPRESSION: 1. No definite acute findings are noted in the abdomen or pelvis to account for the patient's symptoms. 2. Aortic atherosclerosis, in addition to least 3 vessel coronary artery disease. Please note that although the presence of coronary artery calcium documents the presence of coronary artery disease, the severity of this disease and any potential stenosis cannot be assessed on this non-gated CT examination. Assessment for potential risk factor modification, dietary therapy or pharmacologic therapy may be warranted, if clinically indicated. 3. Calcified pleural plaques in the base of the right hemithorax. Left-sided calcified pleural plaques were previously noted on chest CT 08/31/2015. 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 4. Additional incidental findings, as above. Aortic Atherosclerosis (ICD10-I70.0). Electronically Signed   By: Vinnie Langton M.D.   On: 11/19/2017 16:26   Dg Chest 2 View  Result Date: 11/12/2017 CLINICAL DATA:  Abdominal pain. EXAM: CHEST  2 VIEW COMPARISON:  CT 08/31/2015. FINDINGS: Mediastinum hilar structures normal. Lungs are clear. No pleural effusion or pneumothorax. Costophrenic angles incompletely imaged. Heart size normal. No acute bony abnormality. IMPRESSION: No acute cardiopulmonary disease. Electronically Signed   By: Marcello Moores  Register   On:  11/06/2017 14:24   US Abdomen Complete  Result Date: 11/15/2017 CLINICAL DATA:  Acute kidney injury, transaminitis, hyperbilirubinemia, jaundice, abdominal pain. EXAM: ABDOMEN ULTRASOUND COMPLETE COMPARISON:  CT 11/19/2017 FINDINGS: Gallbladder: Gallbladder is filled with sludge. No wall thickening. No visible stones. Negative sonographic Murphy's. Common bile duct: Diameter: Common bile duct is dilated to 11 mm. Sludge seen within the common bile duct. No visible ductal stones. Liver: Intrahepatic biliary ductal dilatation. No visible focal hepatic lesion. Normal echotexture. Portal vein is patent on color Doppler imaging with normal direction of blood flow towards the liver. IVC: No abnormality visualized. Pancreas: Dilated pancreatic duct measuring up to 7 mm. No visible focal pancreatic mass lesion. Spleen: Size and appearance within normal limits. Right Kidney: Length: 11.5 cm. No hydronephrosis. Cysts within the right kidney, measuring up to 4.2 cm. Normal echotexture. Left Kidney: Length: 12.1 cm. Cysts within the left kidney, measuring up to 8.2 cm. No hydronephrosis. Normal echotexture. Abdominal aorta: No aneurysm  visualized. Other findings: None. IMPRESSION: Intrahepatic and extrahepatic biliary ductal dilatation. Pancreatic ductal dilatation. Sludge noted within the common bile duct. No visible obstructing stones or visible pancreatic head mass, but the constellation of findings is concerning for ductal occlusion in the region of the pancreatic head. Recommend further evaluation with MRCP or ERCP filled gallbladder. No visible stones. Bilateral renal cysts. Electronically Signed   By: Rolm Baptise M.D.   On: 11/15/2017 20:03    Background: 84 y.o. year-old with PMH DM, HTN (on ACE), h/o colorectal CA with hemicolectomy in the past, prostate CA, h/o stroke, h/o seizure, and early dementia. Resides at [facility]. Presented with abdominal pain, reduced appetite. Found to have elevated lipase(175), AST  (216), ALT (164) , and alk phos (1158) and unremakable CT non contrast. We are asked to see because creatinine elevated at 10.75and bicarb of 15. Oliguric since admission despite close to 3 liters of fluid. Was noted to be orthostatic at his facility prior to transfer with lying BP of 92, and was on lisinopril and metformin.   (Baseline creatinine 1.03 05/2017)   Assessment/Recommendations  1. AKI - suspect volume depletion (based on exam and BP's) with lisinopril on board. Has had total of about 3L fluid, still looks dry. FeNa c/w ATN so have to think may have been going on for a while. Still room for additional hydration, change to isotonic bicarb, trend labs and do careful exam. He knows nothing about dialysis, family indicated to primary that if it came to that they might proceed, but no indications right now  2. Metabolic acidosis - 2/2 #1 + metformin. Had 1 amp bicarb past 24 hours. CO2 still 15. Since still needs some additional volume, change to isotonic bicarb at 125. 3. RUQ pain - felt to be probable gallstone pancreatitis. LFT's and lipase elevated.Gi consulted 4. HTN - HYPOtensive PTA on lisinopril (holding of course) 5. DM - holding metformin 6. Dementia - not sure of severity. Like th primary care, I had a very reasonable conversation with him - will need to check with family.    Jamal Maes,  MD [facility] [phone] pager 11/21/2017, 3:34 PM

## 2017-11-21 NOTE — Progress Notes (Signed)
Initial Nutrition Assessment  DOCUMENTATION CODES:   Underweight, Severe malnutrition in context of chronic illness  INTERVENTION:   Nepro Shake po BID, each supplement provides 425 kcal and 19 grams protein  NUTRITION DIAGNOSIS:   Severe Malnutrition related to chronic illness, cancer and cancer related treatments as evidenced by 17.4% weight loss four months, severe fat depletion, severe muscle depletion.  GOAL:   Patient will meet greater than or equal to 90% of their needs  MONITOR:   PO intake, Supplement acceptance, Labs, Weight trends, I & O's  REASON FOR ASSESSMENT:   Consult Assessment of nutrition requirement/status  ASSESSMENT:   Pt with PMH significant for HTN, HLD, DM, CKD III, prostate cancer, colorectal cancer s/p hemicolectomy, seizures, and early dementia. Presents this admission with AKI on CKD suspected to be related to prerenal dehydration.    Spoke with pt at bedside. Pt pleasant but somewhat confused. States he hasn't had  anything to eat for 3 day prior to admission. He then corrected himself and said for 1 week. When asked what type of food options he is eating, pt could not provide dietary recall. Suspect intake has been poor for prolonged period.  Pt unsure of his UBW. Records indicate pt weighed 155 lb 07/12/17 and 128 lb this admission. Thi shows a 17.4% in 4 months. Significant for time frame. Nutrition-Focused physical exam completed. Will provide supplementation. No family at bedside during visit. Will try to obtain more history if possible.   Medications reviewed and include: NS @ 100 ml/hr Labs reviewed: BUN 90 (H) Creatinine 10.74 (H) ALP 1089 (H) AST 203 (H) ALT 152 (H)   NUTRITION - FOCUSED PHYSICAL EXAM:    Most Recent Value  Orbital Region  Moderate depletion  Upper Arm Region  Severe depletion  Thoracic and Lumbar Region  Severe depletion  Buccal Region  Moderate depletion  Temple Region  Moderate depletion  Clavicle Bone Region   Severe depletion  Clavicle and Acromion Bone Region  Severe depletion  Scapular Bone Region  Severe depletion  Dorsal Hand  Moderate depletion  Patellar Region  Severe depletion  Anterior Thigh Region  Severe depletion  Posterior Calf Region  Severe depletion  Edema (RD Assessment)  None  Hair  Reviewed  Eyes  Reviewed  Mouth  Reviewed  Skin  Reviewed  Nails  Reviewed     Diet Order:  Diet renal/carb modified with fluid restriction Fluid restriction: Other (see comments); Room service appropriate? Yes; Fluid consistency: Thin  EDUCATION NEEDS:   Not appropriate for education at this time  Skin:  Skin Assessment: Reviewed RN Assessment  Last BM:  11/12/2017  Height:   Ht Readings from Last 1 Encounters:  11/01/2017 5\' 9"  (1.753 m)    Weight:   Wt Readings from Last 1 Encounters:  11/21/17 128 lb (58.1 kg)    Ideal Body Weight:  72.7 kg  BMI:  Body mass index is 18.9 kg/m.  Estimated Nutritional Needs:   Kcal:  2000-2200 kcal/day  Protein:  100-110 g/day  Fluid:  > 2 L/day    Mariana Single RD, LDN Clinical Nutrition Pager # - [phone]

## 2017-11-22 DIAGNOSIS — R109 Unspecified abdominal pain: Secondary | ICD-10-CM

## 2017-11-22 LAB — CBC
HEMATOCRIT: 25.7 % — AB (ref 39.0–52.0)
HEMOGLOBIN: 9.2 g/dL — AB (ref 13.0–17.0)
MCH: 30.6 pg (ref 26.0–34.0)
MCHC: 35.8 g/dL (ref 30.0–36.0)
MCV: 85.4 fL (ref 78.0–100.0)
Platelets: 164 10*3/uL (ref 150–400)
RBC: 3.01 MIL/uL — ABNORMAL LOW (ref 4.22–5.81)
RDW: 15.9 % — AB (ref 11.5–15.5)
WBC: 3.8 10*3/uL — ABNORMAL LOW (ref 4.0–10.5)

## 2017-11-22 LAB — COMPREHENSIVE METABOLIC PANEL
ALBUMIN: 2.5 g/dL — AB (ref 3.5–5.0)
ALT: 148 U/L — ABNORMAL HIGH (ref 17–63)
AST: 240 U/L — AB (ref 15–41)
Alkaline Phosphatase: 1064 U/L — ABNORMAL HIGH (ref 38–126)
Anion gap: 18 — ABNORMAL HIGH (ref 5–15)
BUN: 96 mg/dL — AB (ref 6–20)
CHLORIDE: 106 mmol/L (ref 101–111)
CO2: 16 mmol/L — AB (ref 22–32)
Calcium: 6.2 mg/dL — CL (ref 8.9–10.3)
Creatinine, Ser: 11.06 mg/dL — ABNORMAL HIGH (ref 0.61–1.24)
GFR calc Af Amer: 4 mL/min — ABNORMAL LOW (ref >60)
GFR calc non Af Amer: 4 mL/min — ABNORMAL LOW (ref >60)
GLUCOSE: 267 mg/dL — AB (ref 65–99)
Potassium: 4.1 mmol/L (ref 3.5–5.1)
SODIUM: 140 mmol/L (ref 135–145)
Total Bilirubin: 7.6 mg/dL — ABNORMAL HIGH (ref 0.3–1.2)
Total Protein: 5.8 g/dL — ABNORMAL LOW (ref 6.5–8.1)

## 2017-11-22 LAB — HEPATITIS PANEL, ACUTE
HCV Ab: <0.1 s/co ratio (ref 0.0–0.9)
HEP A IGM: NEGATIVE
HEP B C IGM: NEGATIVE
Hepatitis B Surface Ag: NEGATIVE

## 2017-11-22 LAB — PHOSPHORUS: Phosphorus: 9.1 mg/dL — ABNORMAL HIGH (ref 2.5–4.6)

## 2017-11-22 MED ORDER — ONDANSETRON HCL 4 MG/2ML IJ SOLN: 4.0000 mg | Freq: Four times a day (QID) | INTRAMUSCULAR | Status: DC | PRN

## 2017-11-22 MED ORDER — CALCIUM ACETATE (PHOS BINDER) 667 MG PO CAPS
1334.0000 mg | ORAL_CAPSULE | Freq: Three times a day (TID) | ORAL | Status: DC
  Administered 2017-11-22 – 2017-11-23 (×4): 1334 mg via ORAL
  Filled 2017-11-22 (×9): qty 2

## 2017-11-22 MED ORDER — MORPHINE SULFATE (PF) 4 MG/ML IV SOLN
1.0000 mg | INTRAVENOUS | Status: DC | PRN
  Administered 2017-11-22 – 2017-11-23 (×2): 1 mg via INTRAVENOUS
  Filled 2017-11-22 (×3): qty 1

## 2017-11-22 MED ORDER — SODIUM CHLORIDE 0.9 % IV SOLN: INTRAVENOUS | Status: DC

## 2017-11-22 NOTE — Progress Notes (Signed)
CRITICAL VALUE ALERT  Critical Value:  Calcium 6.2  Date & Time Notied:  11/22/17  6659  Provider Notified:  Dr. Jamal Maes is here on the unit. She states that she                               saw the critical lab already.   Orders Received/Actions taken: New orders given.  See orders.

## 2017-11-22 NOTE — Progress Notes (Signed)
Pt has low urine output...Keith KitchenMarland KitchenMD has been notified by writer of urine output, pt currently arouses to name call, and pain when moving and repositioning. Son in earlier and Dr. Benson Norway updated son planned ERCP. Consent signed by son Ron. Pt resting at the current time. IV infusing as ordered. VSs. SRP, RN

## 2017-11-22 NOTE — Progress Notes (Signed)
PROGRESS NOTE    [name]  ZLD:[id number] DOB: 1931/03/22 DOA: 10/31/2017 PCP: Charolette Forward, MD    Brief Narrative:  84 y.o. male with medical history significant for hypertension, hyperlipidemia, type 2 diabetes, prostate cancer, colorectal cancer status post hemicolectomy, seizures, early dementia who presents on 10/23/2017 with complaints of abdominal pain and decreased appetite was found to have AK I, transaminitis, hyperbilirubinemia of unclear etiology.  [name] is unable to provide much history given his dementia.  He only states that he is noticed some abdominal pain and that he is not eating.  He says his belly pain is "neutral".  He does not specifically endorse a decreased appetite. Per facility report patient has been having abdominal pain with decreased appetite for 2 days.  Patient was noted to be orthostatic at the facility of the lying systolic of 92 which dropped to 72 upon sitting.   ED Course: Patient is afebrile, hemodynamically stable.  Lab workup significant for creatinine of 10.75, BUN 90, lipase 175, AST 216, ALT 164, alk phos 1158, total bilirubin 8.4.  Troponin 0 0.29.  WBC 4.4, hemoglobin 10.5. CT noncontrast was unremarkable. Given  significantly elevated creatinine, patient was admitted to hospitalist service   Assessment & Plan:   Active Problems:   Malignant neoplasm of cecum (HCC)   Hypertension   Type II diabetes mellitus with renal manifestations (HCC)   HLD (hyperlipidemia)   AKI (acute kidney injury) (HCC)   Transaminitis   Hyperbilirubinemia   Abdominal pain  Right upper quadrant abdominal pain, stable -Suspected gallstone pancreatitis. CT abd/pelvis reviewed -LFT's stable at present -Total Bili trending down -Abd [place] reviewed. Findings of biliary ductal dilatation including pancreatic duct -GI consulted, tentative plans for ERCP 2/1 -Continued on IVF hydration  AKI on CKD, unclear etiology, suspect related to  prerenal/dehydration -Baseline creatinine 1-1.2.  Seen in 05/2017 with a creatinine of 1.   -Cr on admission of 10.7 -Reported to have decreased appetite -Continue foley cath to monitor I/O -Continue to hold lisinopril per home meds and metformin -Calculated FENa of 1.2 - Appreciate Nephrology assistance - Patient continued on bicarb IVF. Still with minimal urine output. Cr up to 11 today - Repeat bmet in AM  Transaminitis, hyperbilirubinemia, elevated alk phos, unclear etiology -Per above, CT and [place] reviewed -GI consulted - LFT's stable currently  Elevated troponin -Denies chest pains -Suspect related to above renal failure -stable at present  Hypertension, controlled -Holding home lisinopril in setting of AKI -BP stable at this time  Hyperlipidemia - Hold home statin in the setting of transaminitis -Recheck LFT's in AM  DVT prophylaxis: Heparin subQ Code Status: Full Family Communication: Pt in room, called son over phone Disposition Plan: Uncertain at this time  Consultants:   Nephrology  GI  Procedures:     Antimicrobials: Anti-infectives (From admission, onward)   None      Subjective: No complaints at this time  Objective: Vitals:   11/21/17 2033 11/22/17 0433 11/22/17 0943 11/22/17 1326  BP: (!) 100/54 104/60 (!) 109/58 114/61  Pulse: 65 64 (!) 59 69  Resp: 18 18    Temp: (!) 97.5 F (36.4 C) (!) 97.3 F (36.3 C) 98 F (36.7 C) 98.5 F (36.9 C)  TempSrc: Oral Oral Oral Oral  SpO2: 98% 98% 100% 100%  Weight:      Height:        Intake/Output Summary (Last 24 hours) at 11/22/2017 1607 Last data filed at 11/22/2017 1605 Gross per 24 hour  Intake  217.92 ml  Output 120 ml  Net 97.92 ml   Filed Weights   11/05/2017 1850 11/21/17 0409  Weight: 57.7 kg (127 lb 4.8 oz) 58.1 kg (128 lb)    Examination: General exam: Awake, laying in bed, in nad Respiratory system: Normal respiratory effort, no wheezing Cardiovascular system: regular  rate, s1, s2 Gastrointestinal system: Soft, nondistended, positive BS Central nervous system: CN2-12 grossly intact, strength intact Extremities: Perfused, no clubbing Skin: Normal skin turgor, no notable skin lesions seen Psychiatry: Mood normal // no visual hallucinations   Data Reviewed: I have personally reviewed following labs and imaging studies  CBC: Recent Labs  Lab 11/22/2017 1424 11/21/17 0109 11/22/17 0514  WBC 4.4 4.3 3.8*  NEUTROABS 3.2  --   --   HGB 10.2* 9.7* 9.2*  HCT 29.8* 27.4* 25.7*  MCV 89.8 87.5 85.4  PLT 213 197 035   Basic Metabolic Panel: Recent Labs  Lab 10/29/2017 1424 11/21/17 0109 11/22/17 0514  NA 137 139 140  K 4.7 4.4 4.1  CL 102 104 106  CO2 15* 15* 16*  GLUCOSE 199* 192* 267*  BUN 90* 90* 96*  CREATININE 10.75* 10.74* 11.06*  CALCIUM 7.1* 6.8* 6.2*  PHOS  --   --  9.1*   GFR: Estimated Creatinine Clearance: 4.1 mL/min (A) (by C-G formula based on SCr of 11.06 mg/dL (H)). Liver Function Tests: Recent Labs  Lab 11/12/2017 1424 11/21/17 0109 11/22/17 0514  AST 216* 203* 240*  ALT 164* 152* 148*  ALKPHOS 1,158* 1,089* 1,064*  BILITOT 8.4* 8.2* 7.6*  PROT 7.2 6.9 5.8*  ALBUMIN 3.1* 2.9* 2.5*   Recent Labs  Lab 11/10/2017 1424  LIPASE 175*   No results for input(s): AMMONIA in the last 168 hours. Coagulation Profile: Recent Labs  Lab 11/19/2017 1932  INR 1.28   Cardiac Enzymes: Recent Labs  Lab 11/02/2017 1424 10/26/2017 1932 11/21/17 0109 11/21/17 0720  TROPONINI 0.29* 0.26* 0.25* 0.25*   BNP (last 3 results) No results for input(s): PROBNP in the last 8760 hours. HbA1C: No results for input(s): HGBA1C in the last 72 hours. CBG: No results for input(s): GLUCAP in the last 168 hours. Lipid Profile: No results for input(s): CHOL, HDL, LDLCALC, TRIG, CHOLHDL, LDLDIRECT in the last 72 hours. Thyroid Function Tests: No results for input(s): TSH, T4TOTAL, FREET4, T3FREE, THYROIDAB in the last 72 hours. Anemia Panel: No  results for input(s): VITAMINB12, FOLATE, FERRITIN, TIBC, IRON, RETICCTPCT in the last 72 hours. Sepsis Labs: Recent Labs  Lab 11/19/2017 1424  LATICACIDVEN 1.7    Recent Results (from the past 240 hour(s))  MRSA PCR Screening     Status: None   Collection Time: 11/21/17  6:02 AM  Result Value Ref Range Status   MRSA by PCR NEGATIVE NEGATIVE Final    Comment:        The GeneXpert MRSA Assay (FDA approved for NASAL specimens only), is one component of a comprehensive MRSA colonization surveillance program. It is not intended to diagnose MRSA infection nor to guide or monitor treatment for MRSA infections.      Radiology Studies: Ct Abdomen Pelvis Wo Contrast  Result Date: 10/26/2017 CLINICAL DATA:  84 year old male with history of abdominal pain and poor PO intake over the past 2 days. EXAM: CT ABDOMEN AND PELVIS WITHOUT CONTRAST TECHNIQUE: Multidetector CT imaging of the abdomen and pelvis was performed following the standard protocol without IV contrast. COMPARISON:  CT of the chest, abdomen and pelvis 08/31/2015. FINDINGS: Lower chest: Atherosclerotic calcifications in  the left anterior descending, left circumflex and right coronary arteries. Small calcified pleural plaques in the base of the right hemithorax. No definite calcified pleural plaques noted in the visualized left hemithorax. Hepatobiliary: No definite cystic or solid hepatic lesions are noted on today's noncontrast CT examination. Amorphous intermediate attenuation lying dependently in the gallbladder, presumably biliary sludge. No surrounding inflammatory changes are noted to suggest an acute cholecystitis at this time. Pancreas: No definite pancreatic mass or peripancreatic inflammatory changes on today's noncontrast CT examination. Spleen: Unremarkable. Adrenals/Urinary Tract: Low-attenuation lesions in both kidneys, incompletely characterized on today's noncontrast CT examination, but previously characterized as cysts.  The largest of these is exophytic in the upper pole the left kidney measuring 7.5 x 8.6 cm, and demonstrates some peripheral calcifications in the wall, similar to the prior study. Bilateral adrenal glands are normal in appearance. No hydroureteronephrosis. Urinary bladder is normal in appearance. Stomach/Bowel: Unenhanced appearance of the stomach is normal. No pathologic dilatation of small bowel or colon. Status post right hemicolectomy. Vascular/Lymphatic: Aortic atherosclerosis, without evidence of aneurysm in the abdominal or pelvic vasculature. No lymphadenopathy noted in the abdomen or pelvis on today's noncontrast CT examination. Reproductive: Fiducial markers adjacent to the prostate gland. Prostate gland and seminal vesicles are otherwise unremarkable in appearance. Other: No significant volume of ascites.  No pneumoperitoneum. Musculoskeletal: There are no aggressive appearing lytic or blastic lesions noted in the visualized portions of the skeleton. IMPRESSION: 1. No definite acute findings are noted in the abdomen or pelvis to account for the patient's symptoms. 2. Aortic atherosclerosis, in addition to least 3 vessel coronary artery disease. Please note that although the presence of coronary artery calcium documents the presence of coronary artery disease, the severity of this disease and any potential stenosis cannot be assessed on this non-gated CT examination. Assessment for potential risk factor modification, dietary therapy or pharmacologic therapy may be warranted, if clinically indicated. 3. Calcified pleural plaques in the base of the right hemithorax. Left-sided calcified pleural plaques were previously noted on chest CT 08/31/2015. 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 4. Additional incidental findings, as above. Aortic Atherosclerosis (ICD10-I70.0). Electronically Signed   By: Vinnie Langton M.D.   On: 10/31/2017 16:26   US Abdomen  Complete  Result Date: 10/28/2017 CLINICAL DATA:  Acute kidney injury, transaminitis, hyperbilirubinemia, jaundice, abdominal pain. EXAM: ABDOMEN ULTRASOUND COMPLETE COMPARISON:  CT 10/26/2017 FINDINGS: Gallbladder: Gallbladder is filled with sludge. No wall thickening. No visible stones. Negative sonographic Murphy's. Common bile duct: Diameter: Common bile duct is dilated to 11 mm. Sludge seen within the common bile duct. No visible ductal stones. Liver: Intrahepatic biliary ductal dilatation. No visible focal hepatic lesion. Normal echotexture. Portal vein is patent on color Doppler imaging with normal direction of blood flow towards the liver. IVC: No abnormality visualized. Pancreas: Dilated pancreatic duct measuring up to 7 mm. No visible focal pancreatic mass lesion. Spleen: Size and appearance within normal limits. Right Kidney: Length: 11.5 cm. No hydronephrosis. Cysts within the right kidney, measuring up to 4.2 cm. Normal echotexture. Left Kidney: Length: 12.1 cm. Cysts within the left kidney, measuring up to 8.2 cm. No hydronephrosis. Normal echotexture. Abdominal aorta: No aneurysm visualized. Other findings: None. IMPRESSION: Intrahepatic and extrahepatic biliary ductal dilatation. Pancreatic ductal dilatation. Sludge noted within the common bile duct. No visible obstructing stones or visible pancreatic head mass, but the constellation of findings is concerning for ductal occlusion in the region of the pancreatic head. Recommend further evaluation with MRCP or  ERCP filled gallbladder. No visible stones. Bilateral renal cysts. Electronically Signed   By: Rolm Baptise M.D.   On: 10/27/2017 20:03    Scheduled Meds: . calcium acetate  1,334 mg Oral TID WC  . heparin  5,000 Units Subcutaneous Q8H   Continuous Infusions: . dextrose 5 % 850 mL with sodium bicarbonate 150 mEq infusion 75 mL/hr at 11/22/17 1551     LOS: 2 days   Marylu Lund, MD [facility] Pager [phone]  If  7PM-7AM, please contact night-coverage [email or web address] Password [email or web address] 11/22/2017, 4:07 PM

## 2017-11-22 NOTE — Progress Notes (Signed)
[facility] Rounding Note  Subjective/Interval History:  Still says he feels fine Lying flat, no SOB  Objective Vital signs in last 24 hours: Vitals:   11/21/17 0409 11/21/17 1550 11/21/17 2033 11/22/17 0433  BP: (!) 93/47 (!) 106/55 (!) 100/54 104/60  Pulse: 63  65 64  Resp: '20 17 18 18  '$ Temp: 98.4 F (36.9 C) 97.9 F (36.6 C) (!) 97.5 F (36.4 C) (!) 97.3 F (36.3 C)  TempSrc: Oral Oral Oral Oral  SpO2: 100% 96% 98% 98%  Weight: 58.1 kg (128 lb)     Height:       Weight change:   Intake/Output Summary (Last 24 hours) at 11/22/2017 0643 Last data filed at 11/21/2017 1800 Gross per 24 hour  Intake 732.92 ml  Output 30 ml  Net 702.92 ml   Physical Exam:  Blood pressure 104/60, pulse 64, temperature (!) 97.3 F (36.3 C), temperature source Oral, resp. rate 18, height '5\' 9"'$  (1.753 m), weight 58.1 kg (128 lb), SpO2 98 %. Small framed light skinned AAM. NAD Remains alert VS as noted Neck veins now visible at 30 degrees (yesterday only when supine) Lungs remain entirely clear S1S2 No S3 No murmur Abdomen mild distention, minimal pain in RUQ with palpation + BS No edema whatsoever Urine dark brown only small amount   Recent Labs  Lab 11/08/2017 1424 11/21/17 0109 11/22/17 0514  NA 137 139 140  K 4.7 4.4 4.1  CL 102 104 106  CO2 15* 15* 16*  GLUCOSE 199* 192* 267*  BUN 90* 90* PENDING  CREATININE 10.75* 10.74* 11.06*  CALCIUM 7.1* 6.8* 6.2*  PHOS  --   --  9.1*    Recent Labs  Lab 11/10/2017 1424 11/21/17 0109 11/22/17 0514  AST 216* 203* 240*  ALT 164* 152* 148*  ALKPHOS 1,158* 1,089* 1,064*  BILITOT 8.4* 8.2* 7.6*  PROT 7.2 6.9 5.8*  ALBUMIN 3.1* 2.9* 2.5*   Recent Labs  Lab 11/08/2017 1424  LIPASE 175*    Recent Labs  Lab 11/02/2017 1424 11/21/17 0109 11/22/17 0514  WBC 4.4 4.3 3.8*  NEUTROABS 3.2  --   --   HGB 10.2* 9.7* 9.2*  HCT 29.8* 27.4* 25.7*  MCV 89.8 87.5 85.4  PLT 213 197 164    Recent Labs  Lab 11/22/2017 1424 11/08/2017 1932  11/21/17 0109 11/21/17 0720  TROPONINI 0.29* 0.26* 0.25* 0.25*    Studies/Results: Ct Abdomen Pelvis Wo Contrast  Result Date: 10/31/2017 CLINICAL DATA:  84 year old male with history of abdominal pain and poor PO intake over the past 2 days. EXAM: CT ABDOMEN AND PELVIS WITHOUT CONTRAST TECHNIQUE: Multidetector CT imaging of the abdomen and pelvis was performed following the standard protocol without IV contrast. COMPARISON:  CT of the chest, abdomen and pelvis 08/31/2015. FINDINGS: Lower chest: Atherosclerotic calcifications in the left anterior descending, left circumflex and right coronary arteries. Small calcified pleural plaques in the base of the right hemithorax. No definite calcified pleural plaques noted in the visualized left hemithorax. Hepatobiliary: No definite cystic or solid hepatic lesions are noted on today's noncontrast CT examination. Amorphous intermediate attenuation lying dependently in the gallbladder, presumably biliary sludge. No surrounding inflammatory changes are noted to suggest an acute cholecystitis at this time. Pancreas: No definite pancreatic mass or peripancreatic inflammatory changes on today's noncontrast CT examination. Spleen: Unremarkable. Adrenals/Urinary Tract: Low-attenuation lesions in both kidneys, incompletely characterized on today's noncontrast CT examination, but previously characterized as cysts. The largest of these is exophytic in the upper pole the  left kidney measuring 7.5 x 8.6 cm, and demonstrates some peripheral calcifications in the wall, similar to the prior study. Bilateral adrenal glands are normal in appearance. No hydroureteronephrosis. Urinary bladder is normal in appearance. Stomach/Bowel: Unenhanced appearance of the stomach is normal. No pathologic dilatation of small bowel or colon. Status post right hemicolectomy. Vascular/Lymphatic: Aortic atherosclerosis, without evidence of aneurysm in the abdominal or pelvic vasculature. No  lymphadenopathy noted in the abdomen or pelvis on today's noncontrast CT examination. Reproductive: Fiducial markers adjacent to the prostate gland. Prostate gland and seminal vesicles are otherwise unremarkable in appearance. Other: No significant volume of ascites.  No pneumoperitoneum. Musculoskeletal: There are no aggressive appearing lytic or blastic lesions noted in the visualized portions of the skeleton. IMPRESSION: 1. No definite acute findings are noted in the abdomen or pelvis to account for the patient's symptoms. 2. Aortic atherosclerosis, in addition to least 3 vessel coronary artery disease. Please note that although the presence of coronary artery calcium documents the presence of coronary artery disease, the severity of this disease and any potential stenosis cannot be assessed on this non-gated CT examination. Assessment for potential risk factor modification, dietary therapy or pharmacologic therapy may be warranted, if clinically indicated. 3. Calcified pleural plaques in the base of the right hemithorax. Left-sided calcified pleural plaques were previously noted on chest CT 08/31/2015. 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 4. Additional incidental findings, as above. Aortic Atherosclerosis (ICD10-I70.0). Electronically Signed   By: Vinnie Langton M.D.   On: 10/28/2017 16:26   Dg Chest 2 View  Result Date: 11/03/2017 CLINICAL DATA:  Abdominal pain. EXAM: CHEST  2 VIEW COMPARISON:  CT 08/31/2015. FINDINGS: Mediastinum hilar structures normal. Lungs are clear. No pleural effusion or pneumothorax. Costophrenic angles incompletely imaged. Heart size normal. No acute bony abnormality. IMPRESSION: No acute cardiopulmonary disease. Electronically Signed   By: Marcello Moores  Register   On: 11/17/2017 14:24   US Abdomen Complete  Result Date: 11/18/2017 CLINICAL DATA:  Acute kidney injury, transaminitis, hyperbilirubinemia, jaundice, abdominal pain. EXAM:  ABDOMEN ULTRASOUND COMPLETE COMPARISON:  CT 10/23/2017 FINDINGS: Gallbladder: Gallbladder is filled with sludge. No wall thickening. No visible stones. Negative sonographic Murphy's. Common bile duct: Diameter: Common bile duct is dilated to 11 mm. Sludge seen within the common bile duct. No visible ductal stones. Liver: Intrahepatic biliary ductal dilatation. No visible focal hepatic lesion. Normal echotexture. Portal vein is patent on color Doppler imaging with normal direction of blood flow towards the liver. IVC: No abnormality visualized. Pancreas: Dilated pancreatic duct measuring up to 7 mm. No visible focal pancreatic mass lesion. Spleen: Size and appearance within normal limits. Right Kidney: Length: 11.5 cm. No hydronephrosis. Cysts within the right kidney, measuring up to 4.2 cm. Normal echotexture. Left Kidney: Length: 12.1 cm. Cysts within the left kidney, measuring up to 8.2 cm. No hydronephrosis. Normal echotexture. Abdominal aorta: No aneurysm visualized. Other findings: None. IMPRESSION: Intrahepatic and extrahepatic biliary ductal dilatation. Pancreatic ductal dilatation. Sludge noted within the common bile duct. No visible obstructing stones or visible pancreatic head mass, but the constellation of findings is concerning for ductal occlusion in the region of the pancreatic head. Recommend further evaluation with MRCP or ERCP filled gallbladder. No visible stones. Bilateral renal cysts. Electronically Signed   By: Rolm Baptise M.D.   On: 11/11/2017 20:03   Medications: . dextrose 5 % 850 mL with sodium bicarbonate 150 mEq infusion 125 mL/hr at 11/22/17 0456   . heparin  5,000 Units Subcutaneous Q8H  Background: 84 y.o. year-old with PMH DM, HTN (on ACE), h/o colorectal CA with hemicolectomy in the past, prostate CA, h/o stroke, h/o seizure, and early dementia. Resides at [facility]. Presented with abdominal pain, reduced appetite. Found to have elevated lipase(175), AST (216), ALT (164) , and  alk phos (1158) and unremakable CT non contrast. We are asked to see because creatinine elevated at 10.75and bicarb of 15. Oliguric since admission despite close to 3 liters of fluid. Was noted to be orthostatic at his facility prior to transfer with lying BP of 92, and was on lisinopril and metformin.   (Baseline creatinine 1.03 05/2017)   Assessment/Recommendations 1. AKI - suspect severe volume depletion and hypotension for unknown duration PTA with lisinopril on board. Has had total of about 3L fluid, volume looking better. Remains oliguric, FeNa c/w ATN. Current fluids isotonic bicarb at 125. No improvement in renal function (in fact creatinine now 11) 1. Continue to trend labs/careful physical exams. 2. Slow fluids to 75 cc/hour 3. No dialysis indications at this time.  4. Given normal renal function at baseline would anticipate recovery at some point - will just have to be careful not to overload him.  2. Metabolic acidosis - 2/2 #1 + metformin. Isotonic bicarbonate started last PM at 125/hour. Bicarb still at 16. Continue isotonic bicarb (but have slowed rate) 3. Hypocalcemia/hyperphosphatemia - adding ca acetate 4. Hypotension - BP still a little soft but mostly out of the 90's now with volume 5. RUQ pain - felt to be probable gallstone pancreatitis. LFT's and lipase elevated.GI consulted with plans for ERCP on Friday (Dr. Benson Norway) 6. HTN - HYPOtensive PTA on lisinopril (holding of course) 7. DM - holding metformin 8. Dementia - not sure of severity. Like the primary care, I have had very reasonable conversations with him despite his severe azotemia.     Jamal Maes, MD [facility] [phone] pager 11/22/2017, 6:43 AM

## 2017-11-22 NOTE — Progress Notes (Signed)
Pt nauseated this am, vomited 50-100cc of dark brown emesis mixed with meds. Pt is having int brown liquid stools. SRP, RN

## 2017-11-22 NOTE — Progress Notes (Signed)
Subjective: Some abdominal pain  Objective: Vital signs in last 24 hours: Temp:  [97.3 F (36.3 C)-98.5 F (36.9 C)] 98.5 F (36.9 C) (01/31 1326) Pulse Rate:  [59-69] 69 (01/31 1326) Resp:  [18] 18 (01/31 0433) BP: (100-114)/(54-61) 114/61 (01/31 1326) SpO2:  [98 %-100 %] 100 % (01/31 1326) Last BM Date: 11/22/17  Intake/Output from previous day: 01/30 0701 - 01/31 0700 In: 732.9 [I.V.:732.9] Out: 30 [Urine:30] Intake/Output this shift: Total I/O In: 120 [P.O.:120] Out: 120 [Urine:120]  General appearance: alert and no distress GI: soft, non-tender; bowel sounds normal; no masses,  no organomegaly  Lab Results: Recent Labs    11/05/2017 1424 11/21/17 0109 11/22/17 0514  WBC 4.4 4.3 3.8*  HGB 10.2* 9.7* 9.2*  HCT 29.8* 27.4* 25.7*  PLT 213 197 164   BMET Recent Labs    11/08/2017 1424 11/21/17 0109 11/22/17 0514  NA 137 139 140  K 4.7 4.4 4.1  CL 102 104 106  CO2 15* 15* 16*  GLUCOSE 199* 192* 267*  BUN 90* 90* 96*  CREATININE 10.75* 10.74* 11.06*  CALCIUM 7.1* 6.8* 6.2*   LFT Recent Labs    11/22/17 0514  PROT 5.8*  ALBUMIN 2.5*  AST 240*  ALT 148*  ALKPHOS 1,064*  BILITOT 7.6*   PT/INR Recent Labs    10/25/2017 1932  LABPROT 15.9*  INR 1.28   Hepatitis Panel Recent Labs    11/11/2017 1932  HEPBSAG Negative  HCVAB <0.1  HEPAIGM Negative  HEPBIGM Negative   C-Diff No results for input(s): CDIFFTOX in the last 72 hours. Fecal Lactopherrin No results for input(s): FECLLACTOFRN in the last 72 hours.  Studies/Results: US Abdomen Complete  Result Date: 11/01/2017 CLINICAL DATA:  Acute kidney injury, transaminitis, hyperbilirubinemia, jaundice, abdominal pain. EXAM: ABDOMEN ULTRASOUND COMPLETE COMPARISON:  CT 11/05/2017 FINDINGS: Gallbladder: Gallbladder is filled with sludge. No wall thickening. No visible stones. Negative sonographic Murphy's. Common bile duct: Diameter: Common bile duct is dilated to 11 mm. Sludge seen within the common  bile duct. No visible ductal stones. Liver: Intrahepatic biliary ductal dilatation. No visible focal hepatic lesion. Normal echotexture. Portal vein is patent on color Doppler imaging with normal direction of blood flow towards the liver. IVC: No abnormality visualized. Pancreas: Dilated pancreatic duct measuring up to 7 mm. No visible focal pancreatic mass lesion. Spleen: Size and appearance within normal limits. Right Kidney: Length: 11.5 cm. No hydronephrosis. Cysts within the right kidney, measuring up to 4.2 cm. Normal echotexture. Left Kidney: Length: 12.1 cm. Cysts within the left kidney, measuring up to 8.2 cm. No hydronephrosis. Normal echotexture. Abdominal aorta: No aneurysm visualized. Other findings: None. IMPRESSION: Intrahepatic and extrahepatic biliary ductal dilatation. Pancreatic ductal dilatation. Sludge noted within the common bile duct. No visible obstructing stones or visible pancreatic head mass, but the constellation of findings is concerning for ductal occlusion in the region of the pancreatic head. Recommend further evaluation with MRCP or ERCP filled gallbladder. No visible stones. Bilateral renal cysts. Electronically Signed   By: Rolm Baptise M.D.   On: 11/07/2017 20:03    Medications:  Scheduled: . calcium acetate  1,334 mg Oral TID WC  . heparin  5,000 Units Subcutaneous Q8H   Continuous: . dextrose 5 % 850 mL with sodium bicarbonate 150 mEq infusion 75 mL/hr at 11/22/17 1551    Assessment/Plan: 1) Choledocholithiasis. 2) Abnormal liver enzymes. 3) AKI.   His liver enzymes remain markedly elevated.  Clinically he is stable, but the ultrasound is significant for intrahepatic and  CBD dilation.  Another significant concern is his markedly elevated creatinine.  Anesthesia will evaluate tomorrow preprocedure.  Plan: 1) ERCP with stone extraction tomorrow if the renal issue does not preclude the procedure.  LOS: 2 days   Munachimso Palin D 11/22/2017, 6:09 PM

## 2017-11-22 NOTE — H&P (View-Only) (Signed)
Subjective: Some abdominal pain  Objective: Vital signs in last 24 hours: Temp:  [97.3 F (36.3 C)-98.5 F (36.9 C)] 98.5 F (36.9 C) (01/31 1326) Pulse Rate:  [59-69] 69 (01/31 1326) Resp:  [18] 18 (01/31 0433) BP: (100-114)/(54-61) 114/61 (01/31 1326) SpO2:  [98 %-100 %] 100 % (01/31 1326) Last BM Date: 11/22/17  Intake/Output from previous day: 01/30 0701 - 01/31 0700 In: 732.9 [I.V.:732.9] Out: 30 [Urine:30] Intake/Output this shift: Total I/O In: 120 [P.O.:120] Out: 120 [Urine:120]  General appearance: alert and no distress GI: soft, non-tender; bowel sounds normal; no masses,  no organomegaly  Lab Results: Recent Labs    11/18/2017 1424 11/21/17 0109 11/22/17 0514  WBC 4.4 4.3 3.8*  HGB 10.2* 9.7* 9.2*  HCT 29.8* 27.4* 25.7*  PLT 213 197 164   BMET Recent Labs    10/25/2017 1424 11/21/17 0109 11/22/17 0514  NA 137 139 140  K 4.7 4.4 4.1  CL 102 104 106  CO2 15* 15* 16*  GLUCOSE 199* 192* 267*  BUN 90* 90* 96*  CREATININE 10.75* 10.74* 11.06*  CALCIUM 7.1* 6.8* 6.2*   LFT Recent Labs    11/22/17 0514  PROT 5.8*  ALBUMIN 2.5*  AST 240*  ALT 148*  ALKPHOS 1,064*  BILITOT 7.6*   PT/INR Recent Labs    11/04/2017 1932  LABPROT 15.9*  INR 1.28   Hepatitis Panel Recent Labs    11/13/2017 1932  HEPBSAG Negative  HCVAB <0.1  HEPAIGM Negative  HEPBIGM Negative   C-Diff No results for input(s): CDIFFTOX in the last 72 hours. Fecal Lactopherrin No results for input(s): FECLLACTOFRN in the last 72 hours.  Studies/Results: US Abdomen Complete  Result Date: 10/28/2017 CLINICAL DATA:  Acute kidney injury, transaminitis, hyperbilirubinemia, jaundice, abdominal pain. EXAM: ABDOMEN ULTRASOUND COMPLETE COMPARISON:  CT 11/12/2017 FINDINGS: Gallbladder: Gallbladder is filled with sludge. No wall thickening. No visible stones. Negative sonographic Murphy's. Common bile duct: Diameter: Common bile duct is dilated to 11 mm. Sludge seen within the common  bile duct. No visible ductal stones. Liver: Intrahepatic biliary ductal dilatation. No visible focal hepatic lesion. Normal echotexture. Portal vein is patent on color Doppler imaging with normal direction of blood flow towards the liver. IVC: No abnormality visualized. Pancreas: Dilated pancreatic duct measuring up to 7 mm. No visible focal pancreatic mass lesion. Spleen: Size and appearance within normal limits. Right Kidney: Length: 11.5 cm. No hydronephrosis. Cysts within the right kidney, measuring up to 4.2 cm. Normal echotexture. Left Kidney: Length: 12.1 cm. Cysts within the left kidney, measuring up to 8.2 cm. No hydronephrosis. Normal echotexture. Abdominal aorta: No aneurysm visualized. Other findings: None. IMPRESSION: Intrahepatic and extrahepatic biliary ductal dilatation. Pancreatic ductal dilatation. Sludge noted within the common bile duct. No visible obstructing stones or visible pancreatic head mass, but the constellation of findings is concerning for ductal occlusion in the region of the pancreatic head. Recommend further evaluation with MRCP or ERCP filled gallbladder. No visible stones. Bilateral renal cysts. Electronically Signed   By: Rolm Baptise M.D.   On: 11/12/2017 20:03    Medications:  Scheduled: . calcium acetate  1,334 mg Oral TID WC  . heparin  5,000 Units Subcutaneous Q8H   Continuous: . dextrose 5 % 850 mL with sodium bicarbonate 150 mEq infusion 75 mL/hr at 11/22/17 1551    Assessment/Plan: 1) Choledocholithiasis. 2) Abnormal liver enzymes. 3) AKI.   His liver enzymes remain markedly elevated.  Clinically he is stable, but the ultrasound is significant for intrahepatic and  CBD dilation.  Another significant concern is his markedly elevated creatinine.  Anesthesia will evaluate tomorrow preprocedure.  Plan: 1) ERCP with stone extraction tomorrow if the renal issue does not preclude the procedure.  LOS: 2 days   Kearney Evitt D 11/22/2017, 6:09 PM

## 2017-11-23 ENCOUNTER — Encounter (HOSPITAL_COMMUNITY): Admission: EM | Disposition: E | Payer: Self-pay | Source: Home / Self Care | Attending: Internal Medicine

## 2017-11-23 ENCOUNTER — Encounter (HOSPITAL_COMMUNITY): Payer: Self-pay | Admitting: *Deleted

## 2017-11-23 ENCOUNTER — Inpatient Hospital Stay (HOSPITAL_COMMUNITY): Payer: Medicare Other | Admitting: Anesthesiology

## 2017-11-23 ENCOUNTER — Inpatient Hospital Stay (HOSPITAL_COMMUNITY): Payer: Medicare Other

## 2017-11-23 DIAGNOSIS — E43 Unspecified severe protein-calorie malnutrition: Secondary | ICD-10-CM

## 2017-11-23 HISTORY — PX: ERCP: SHX5425

## 2017-11-23 HISTORY — PX: IR BILIARY DRAIN PLACEMENT WITH CHOLANGIOGRAM: IMG6043

## 2017-11-23 LAB — COMPREHENSIVE METABOLIC PANEL
ALK PHOS: 1304 U/L — AB (ref 38–126)
ALT: 175 U/L — AB (ref 17–63)
AST: 340 U/L — AB (ref 15–41)
Albumin: 2.4 g/dL — ABNORMAL LOW (ref 3.5–5.0)
Anion gap: 17 — ABNORMAL HIGH (ref 5–15)
BILIRUBIN TOTAL: 8.5 mg/dL — AB (ref 0.3–1.2)
BUN: 100 mg/dL — AB (ref 6–20)
CALCIUM: 6.8 mg/dL — AB (ref 8.9–10.3)
CO2: 25 mmol/L (ref 22–32)
Chloride: 98 mmol/L — ABNORMAL LOW (ref 101–111)
Creatinine, Ser: 11.29 mg/dL — ABNORMAL HIGH (ref 0.61–1.24)
GFR calc Af Amer: 4 mL/min — ABNORMAL LOW (ref >60)
GFR, EST NON AFRICAN AMERICAN: 4 mL/min — AB (ref >60)
Glucose, Bld: 363 mg/dL — ABNORMAL HIGH (ref 65–99)
Potassium: 3.7 mmol/L (ref 3.5–5.1)
Sodium: 140 mmol/L (ref 135–145)
TOTAL PROTEIN: 6.1 g/dL — AB (ref 6.5–8.1)

## 2017-11-23 LAB — CBC
HCT: 26.6 % — ABNORMAL LOW (ref 39.0–52.0)
Hemoglobin: 9.7 g/dL — ABNORMAL LOW (ref 13.0–17.0)
MCH: 30.4 pg (ref 26.0–34.0)
MCHC: 36.5 g/dL — ABNORMAL HIGH (ref 30.0–36.0)
MCV: 83.4 fL (ref 78.0–100.0)
Platelets: 159 10*3/uL (ref 150–400)
RBC: 3.19 MIL/uL — ABNORMAL LOW (ref 4.22–5.81)
RDW: 15.4 % (ref 11.5–15.5)
WBC: 4 10*3/uL (ref 4.0–10.5)

## 2017-11-23 LAB — GLUCOSE, CAPILLARY
GLUCOSE-CAPILLARY: 276 mg/dL — AB (ref 65–99)
Glucose-Capillary: 190 mg/dL — ABNORMAL HIGH (ref 65–99)

## 2017-11-23 LAB — PHOSPHORUS: Phosphorus: 8.6 mg/dL — ABNORMAL HIGH (ref 2.5–4.6)

## 2017-11-23 SURGERY — ERCP, WITH INTERVENTION IF INDICATED
Anesthesia: General

## 2017-11-23 MED ORDER — INSULIN ASPART 100 UNIT/ML ~~LOC~~ SOLN
0.0000 [IU] | SUBCUTANEOUS | Status: DC
  Administered 2017-11-23: 2 [IU] via SUBCUTANEOUS
  Administered 2017-11-23: 9 [IU] via SUBCUTANEOUS
  Administered 2017-11-24: 2 [IU] via SUBCUTANEOUS

## 2017-11-23 MED ORDER — CIPROFLOXACIN IN D5W 400 MG/200ML IV SOLN
INTRAVENOUS | Status: DC | PRN
  Administered 2017-11-23: 400 mg via INTRAVENOUS

## 2017-11-23 MED ORDER — FENTANYL CITRATE (PF) 100 MCG/2ML IJ SOLN
INTRAMUSCULAR | Status: AC | PRN
  Administered 2017-11-23: 50 ug via INTRAVENOUS
  Administered 2017-11-23: 25 ug via INTRAVENOUS

## 2017-11-23 MED ORDER — PROPOFOL 10 MG/ML IV BOLUS
INTRAVENOUS | Status: DC | PRN
  Administered 2017-11-23: 110 mg via INTRAVENOUS

## 2017-11-23 MED ORDER — PHENYLEPHRINE HCL 10 MG/ML IJ SOLN
INTRAVENOUS | Status: DC | PRN
  Administered 2017-11-23: 50 ug/min via INTRAVENOUS

## 2017-11-23 MED ORDER — INDOMETHACIN 50 MG RE SUPP
RECTAL | Status: AC
  Filled 2017-11-23: qty 2

## 2017-11-23 MED ORDER — CIPROFLOXACIN IN D5W 400 MG/200ML IV SOLN
400.0000 mg | INTRAVENOUS | Status: AC
  Administered 2017-11-23: 400 mg via INTRAVENOUS

## 2017-11-23 MED ORDER — SODIUM CHLORIDE 0.9 % IV SOLN
INTRAVENOUS | Status: DC | PRN
  Administered 2017-11-23: 3 mL

## 2017-11-23 MED ORDER — IOPAMIDOL (ISOVUE-300) INJECTION 61%
INTRAVENOUS | Status: AC
  Administered 2017-11-23: 40 mL
  Filled 2017-11-23: qty 50

## 2017-11-23 MED ORDER — VASOPRESSIN 20 UNIT/ML IV SOLN
INTRAVENOUS | Status: DC | PRN
  Administered 2017-11-23 (×2): 1 [IU] via INTRAVENOUS

## 2017-11-23 MED ORDER — FENTANYL CITRATE (PF) 100 MCG/2ML IJ SOLN
INTRAMUSCULAR | Status: AC
  Filled 2017-11-23: qty 2

## 2017-11-23 MED ORDER — CIPROFLOXACIN IN D5W 400 MG/200ML IV SOLN
INTRAVENOUS | Status: AC
  Filled 2017-11-23: qty 200

## 2017-11-23 MED ORDER — SODIUM CHLORIDE 0.9 % IV SOLN
INTRAVENOUS | Status: DC
  Administered 2017-11-23 (×2): via INTRAVENOUS

## 2017-11-23 MED ORDER — SODIUM CHLORIDE 0.9 % IV SOLN: INTRAVENOUS | Status: DC

## 2017-11-23 MED ORDER — GLUCAGON HCL RDNA (DIAGNOSTIC) 1 MG IJ SOLR
INTRAMUSCULAR | Status: AC
  Filled 2017-11-23: qty 1

## 2017-11-23 MED ORDER — ONDANSETRON HCL 4 MG/2ML IJ SOLN
INTRAMUSCULAR | Status: DC | PRN
  Administered 2017-11-23: 4 mg via INTRAVENOUS

## 2017-11-23 MED ORDER — LIDOCAINE 2% (20 MG/ML) 5 ML SYRINGE
INTRAMUSCULAR | Status: DC | PRN
  Administered 2017-11-23: 100 mg via INTRAVENOUS

## 2017-11-23 MED ORDER — HEPARIN SODIUM (PORCINE) 5000 UNIT/ML IJ SOLN
5000.0000 [IU] | Freq: Three times a day (TID) | INTRAMUSCULAR | Status: DC
  Administered 2017-11-24: 5000 [IU] via SUBCUTANEOUS

## 2017-11-23 MED ORDER — IOPAMIDOL (ISOVUE-300) INJECTION 61%
50.0000 mL | Freq: Once | INTRAVENOUS | Status: AC | PRN
  Administered 2017-11-23: 40 mL

## 2017-11-23 MED ORDER — LIDOCAINE HCL 1 % IJ SOLN
INTRAMUSCULAR | Status: AC | PRN
  Administered 2017-11-23: 10 mL

## 2017-11-23 MED ORDER — MIDAZOLAM HCL 2 MG/2ML IJ SOLN
INTRAMUSCULAR | Status: AC | PRN
  Administered 2017-11-23: 0.5 mg via INTRAVENOUS
  Administered 2017-11-23: 1 mg via INTRAVENOUS

## 2017-11-23 MED ORDER — SODIUM CHLORIDE 0.9 % IV BOLUS (SEPSIS)
1000.0000 mL | Freq: Once | INTRAVENOUS | Status: AC
  Administered 2017-11-23: 1000 mL via INTRAVENOUS

## 2017-11-23 MED ORDER — PHENYLEPHRINE 40 MCG/ML (10ML) SYRINGE FOR IV PUSH (FOR BLOOD PRESSURE SUPPORT)
PREFILLED_SYRINGE | INTRAVENOUS | Status: DC | PRN
  Administered 2017-11-23: 120 ug via INTRAVENOUS
  Administered 2017-11-23: 80 ug via INTRAVENOUS
  Administered 2017-11-23: 120 ug via INTRAVENOUS
  Administered 2017-11-23: 80 ug via INTRAVENOUS

## 2017-11-23 MED ORDER — SUCCINYLCHOLINE CHLORIDE 200 MG/10ML IV SOSY
PREFILLED_SYRINGE | INTRAVENOUS | Status: DC | PRN
  Administered 2017-11-23: 80 mg via INTRAVENOUS

## 2017-11-23 MED ORDER — PROPOFOL 10 MG/ML IV BOLUS
INTRAVENOUS | Status: AC
  Filled 2017-11-23: qty 20

## 2017-11-23 MED ORDER — MIDAZOLAM HCL 2 MG/2ML IJ SOLN
INTRAMUSCULAR | Status: AC
  Filled 2017-11-23: qty 2

## 2017-11-23 NOTE — Sedation Documentation (Signed)
Patient is resting comfortably. 

## 2017-11-23 NOTE — Progress Notes (Addendum)
MEDICATION-RELATED CONSULT NOTE   IR Procedure Consult - Anticoagulant/Antiplatelet PTA/Inpatient Med List Review by Pharmacist    Procedure: Placement of transhepatic biliary drainage catheter    Completed: 12/09/2017 1850  Post-Procedural bleeding risk per IR MD assessment:  High  Antithrombotic medications on inpatient or PTA profile prior to procedure:    Heparin 5000 units SQ q8h  Recommended restart time per IR Post-Procedure Guidelines:   Wait at least 8 hours post-procedure   Other considerations:    Has orders from IR physician assistant to resume SQ heparin tomorrow at 0600  Plan:     Resume Heparin 5000 units SQ q8h tomorrow at 0600 as previously ordered.  Clayburn Pert, PharmD, BCPS [phone] 12/05/2017  9:34 PM

## 2017-11-23 NOTE — Transfer of Care (Signed)
Immediate Anesthesia Transfer of Care Note  Patient: [name]  Procedure(s) Performed: ENDOSCOPIC RETROGRADE CHOLANGIOPANCREATOGRAPHY (ERCP) (N/A )  Patient Location: PACU  Anesthesia Type:General  Level of Consciousness: sedated  Airway & Oxygen Therapy: Patient Spontanous Breathing and Patient connected to face mask oxygen  Post-op Assessment: Report given to RN and Post -op Vital signs reviewed and stable  Post vital signs: Reviewed and stable  Last Vitals:  Vitals:   12/15/2017 1101 12/03/2017 1310  BP: 117/60 (!) 99/52  Pulse: 72 69  Resp: 16 14  Temp: 36.8 C 36.5 C  SpO2: 100% 100%    Last Pain:  Vitals:   11/23/2017 1310  TempSrc: Oral  PainSc:       Patients Stated Pain Goal: 0 (55/37/48 2707)  Complications: No apparent anesthesia complications

## 2017-11-23 NOTE — Consult Note (Signed)
Chief Complaint: Patient was seen in consultation today for percutaneous transhepatic cholangiogram with biliary drain placement Chief Complaint  Patient presents with  . Abdominal Pain    Referring Physician(s): Hung,P  Supervising Physician: Corrie Mckusick  Patient Status: [facility] - In-pt  History of Present Illness: [name] is an 84 y.o. male with past medical history significant for chronic kidney disease, colon cancer with prior hemicolectomy, dementia, diabetes, stroke, hypertension, hyperlipidemia, MI, seizure disorder, prostate cancer recently admitted to  [facility] with persistent abdominal pain, diminished appetite and subsequently found to have worsening kidney function, elevated liver function tests and orthostatic hypotension. Recent imaging studies have revealed intrahepatic and extrahepatic biliary ductal dilatation with pancreatic ductal dilatation and sludge within common bile duct. He was evaluated by GI service and underwent ERCP today which had to be aborted secondary to hemodynamic instability.  Request now received for PTC with biliary drain placement. Past Medical History:  Diagnosis Date  . Acute metabolic encephalopathy 3/73/4287  . CKD (chronic kidney disease), stage III ([place]) 06/14/2017  . Colon cancer ([place])    colon ca dx 7/11  . Dementia without behavioral disturbance 06/26/2017  . Diabetes mellitus   . Diabetic nephropathy ([place])   . History of stroke 06/26/2017  . Hyperlipemia   . Hypertension   . Iron deficiency anemia 06/26/2017  . Malignant neoplasm of cecum ([place]) 08/27/2011  . MI (myocardial infarction) ([place])   . 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 Prostate CA [facility])    prostate  . Stroke ([place])   . Type II diabetes mellitus with renal manifestations ([place]) 06/05/2012    Past Surgical History:  Procedure Laterality Date  . COLON SURGERY  06/2010   right colectomy, T3N2  . PORT-A-CATH REMOVAL  N/A 10/19/2014   Procedure: MINOR REMOVAL PORT-A-CATH;  Surgeon: Autumn Messing III, MD;  Location: [place];  Service: General;  Laterality: N/A;  . PROSTATE SURGERY      Allergies: Penicillins  Medications: Prior to Admission medications   Medication Sig Start Date End Date Taking? Authorizing Provider  atorvastatin (LIPITOR) 40 MG tablet As directed 08/29/15  Yes [provider]  ferrous sulfate 325 (65 FE) MG tablet Take 325 mg by mouth 2 (two) times daily.   Yes [provider]  insulin lispro (HUMALOG KWIKPEN) 100 UNIT/ML KiwkPen Inject into the skin. 10 units before meals if CBG greater than 350; GIVE 10 UNITS IF BS  GREATER THAN 350. Give 3 units with lunch, if CBG greater than 350 give additional 7 units. Give 5 units with dinner, if CBG is great than 350 give 5 additional units to equal 10 units.   Yes [provider]  lisinopril (PRINIVIL,ZESTRIL) 20 MG tablet Take 1 tablet (20 mg total) by mouth daily. 06/15/17  Yes [name], MD  acetaminophen (TYLENOL) 325 MG tablet Take 2 tablets (650 mg total) by mouth every 6 (six) hours as needed for mild pain or fever. 06/18/17   [name], MD  insulin glargine (LANTUS) 100 UNIT/ML injection Inject 0.1 mLs (10 Units total) into the skin daily. Patient not taking: Reported on 11/19/2017 06/18/17   [name], MD  metFORMIN (GLUCOPHAGE) 500 MG tablet Take 500 mg by mouth daily. 11/12/17   [provider]     Family History  Problem Relation Age of Onset  . Diabetes Mellitus II Mother     Social History   Socioeconomic History  . Marital status: Divorced  Spouse name: None  . Number of children: None  . Years of education: None  . Highest education level: None  Social Needs  . Financial resource strain: None  . Food insecurity - worry: None  . Food insecurity - inability: None  . Transportation needs - medical: None  . Transportation needs - non-medical: None    Occupational History  . None  Tobacco Use  . Smoking status: Former Smoker    Packs/day: 0.25    Years: 3.00    Pack years: 0.75    Types: Cigarettes  . Smokeless tobacco: Never Used  Substance and Sexual Activity  . Alcohol use: No  . Drug use: No  . Sexual activity: No  Other Topics Concern  . None  Social History Narrative   Admitted to [place] 06/18/17   Divorced   Former smoker   Alcohol none   Full Code      Review of Systems see above; currently afebrile, denies chest pain, dyspnea, cough, back pain, nausea, vomiting or bleeding.  Vital Signs: BP (!) 114/57   Pulse 68   Temp 97.7 F (36.5 C) (Oral)   Resp 11   Ht _0  (1.753 m)   Wt 128 lb (58.1 kg)   SpO2 100%   BMI 18.90 kg/m   Physical Exam patient drowsy but arousable.  Does answer a few questions; family in room; chest clear to auscultation bilaterally.  Heart with regular rate and rhythm.  Abdomen soft, nondistended, mild right upper quadrant tenderness to palpation;no LE edema  Imaging: Ct Abdomen Pelvis Wo Contrast  Result Date: 11/05/2017 CLINICAL DATA:  84 year old male with history of abdominal pain and poor PO intake over the past 2 days. EXAM: CT ABDOMEN AND PELVIS WITHOUT CONTRAST TECHNIQUE: Multidetector CT imaging of the abdomen and pelvis was performed following the standard protocol without IV contrast. COMPARISON:  CT of the chest, abdomen and pelvis 08/31/2015. FINDINGS: Lower chest: Atherosclerotic calcifications in the left anterior descending, left circumflex and right coronary arteries. Small calcified pleural plaques in the base of the right hemithorax. No definite calcified pleural plaques noted in the visualized left hemithorax. Hepatobiliary: No definite cystic or solid hepatic lesions are noted on today's noncontrast CT examination. Amorphous intermediate attenuation lying dependently in the gallbladder, presumably biliary sludge. No surrounding inflammatory changes  are noted to suggest an acute cholecystitis at this time. Pancreas: No definite pancreatic mass or peripancreatic inflammatory changes on today's noncontrast CT examination. Spleen: Unremarkable. Adrenals/Urinary Tract: Low-attenuation lesions in both kidneys, incompletely characterized on today's noncontrast CT examination, but previously characterized as cysts. The largest of these is exophytic in the upper pole the left kidney measuring 7.5 x 8.6 cm, and demonstrates some peripheral calcifications in the wall, similar to the prior study. Bilateral adrenal glands are normal in appearance. No hydroureteronephrosis. Urinary bladder is normal in appearance. Stomach/Bowel: Unenhanced appearance of the stomach is normal. No pathologic dilatation of small bowel or colon. Status post right hemicolectomy. Vascular/Lymphatic: Aortic atherosclerosis, without evidence of aneurysm in the abdominal or pelvic vasculature. No lymphadenopathy noted in the abdomen or pelvis on today's noncontrast CT examination. Reproductive: Fiducial markers adjacent to the prostate gland. Prostate gland and seminal vesicles are otherwise unremarkable in appearance. Other: No significant volume of ascites.  No pneumoperitoneum. Musculoskeletal: There are no aggressive appearing lytic or blastic lesions noted in the visualized portions of the skeleton. IMPRESSION: 1. No definite acute findings are noted in the abdomen or pelvis to account for  the patient's symptoms. 2. Aortic atherosclerosis, in addition to least 3 vessel coronary artery disease. Please note that although the presence of coronary artery calcium documents the presence of coronary artery disease, the severity of this disease and any potential stenosis cannot be assessed on this non-gated CT examination. Assessment for potential risk factor modification, dietary therapy or pharmacologic therapy may be warranted, if clinically indicated. 3. Calcified pleural plaques in the base of the  right hemithorax. Left-sided calcified pleural plaques were previously noted on chest CT 08/31/2015. 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 4. Additional incidental findings, as above. Aortic Atherosclerosis (ICD10-I70.0). Electronically Signed   By: Vinnie Langton M.D.   On: 11/13/2017 16:26   Dg Chest 2 View  Result Date: 10/31/2017 CLINICAL DATA:  Abdominal pain. EXAM: CHEST  2 VIEW COMPARISON:  CT 08/31/2015. FINDINGS: Mediastinum hilar structures normal. Lungs are clear. No pleural effusion or pneumothorax. Costophrenic angles incompletely imaged. Heart size normal. No acute bony abnormality. IMPRESSION: No acute cardiopulmonary disease. Electronically Signed   By: Marcello Moores  Register   On: 10/24/2017 14:24   US Abdomen Complete  Result Date: 10/27/2017 CLINICAL DATA:  Acute kidney injury, transaminitis, hyperbilirubinemia, jaundice, abdominal pain. EXAM: ABDOMEN ULTRASOUND COMPLETE COMPARISON:  CT 11/14/2017 FINDINGS: Gallbladder: Gallbladder is filled with sludge. No wall thickening. No visible stones. Negative sonographic Murphy's. Common bile duct: Diameter: Common bile duct is dilated to 11 mm. Sludge seen within the common bile duct. No visible ductal stones. Liver: Intrahepatic biliary ductal dilatation. No visible focal hepatic lesion. Normal echotexture. Portal vein is patent on color Doppler imaging with normal direction of blood flow towards the liver. IVC: No abnormality visualized. Pancreas: Dilated pancreatic duct measuring up to 7 mm. No visible focal pancreatic mass lesion. Spleen: Size and appearance within normal limits. Right Kidney: Length: 11.5 cm. No hydronephrosis. Cysts within the right kidney, measuring up to 4.2 cm. Normal echotexture. Left Kidney: Length: 12.1 cm. Cysts within the left kidney, measuring up to 8.2 cm. No hydronephrosis. Normal echotexture. Abdominal aorta: No aneurysm visualized. Other findings: None. IMPRESSION:  Intrahepatic and extrahepatic biliary ductal dilatation. Pancreatic ductal dilatation. Sludge noted within the common bile duct. No visible obstructing stones or visible pancreatic head mass, but the constellation of findings is concerning for ductal occlusion in the region of the pancreatic head. Recommend further evaluation with MRCP or ERCP filled gallbladder. No visible stones. Bilateral renal cysts. Electronically Signed   By: Rolm Baptise M.D.   On: 10/26/2017 20:03   Dg Ercp Biliary & Pancreatic Ducts  Result Date: 11/26/2017 CLINICAL DATA:  ERCP EXAM: ERCP TECHNIQUE: Multiple spot images obtained with the fluoroscopic device and submitted for interpretation post-procedure. FLUOROSCOPY TIME:  Fluoroscopy Time:  1 minute and 4 seconds Radiation Exposure Index (if provided by the fluoroscopic device): Number of Acquired Spot Images: 0 COMPARISON:  None. FINDINGS: The endoscope projects over the duodenum. A wire projects over the common bile duct. IMPRESSION: See above. These images were submitted for radiologic interpretation only. Please see the procedural report for the amount of contrast and the fluoroscopy time utilized. Electronically Signed   By: Marybelle Killings M.D.   On: 12/15/2017 13:16    Labs:  CBC: Recent Labs    11/05/2017 1424 11/21/17 0109 11/22/17 0514 12/03/2017 0435  WBC 4.4 4.3 3.8* 4.0  HGB 10.2* 9.7* 9.2* 9.7*  HCT 29.8* 27.4* 25.7* 26.6*  PLT 213 197 164 159    COAGS: Recent Labs    11/01/2017 1932  INR 1.28    BMP: Recent Labs    11/13/2017 1424 11/21/17 0109 11/22/17 0514 12/11/2017 0435  NA 137 139 140 140  K 4.7 4.4 4.1 3.7  CL 102 104 106 98*  CO2 15* 15* 16* 25  GLUCOSE 199* 192* 267* 363*  BUN 90* 90* 96* 100*  CALCIUM 7.1* 6.8* 6.2* 6.8*  CREATININE 10.75* 10.74* 11.06* 11.29*  GFRNONAA 4* 4* 4* 4*  GFRAA 4* 4* 4* 4*    LIVER FUNCTION TESTS: Recent Labs    11/08/2017 1424 11/21/17 0109 11/22/17 0514 12/20/2017 0435  BILITOT 8.4* 8.2* 7.6* 8.5*    AST 216* 203* 240* 340*  ALT 164* 152* 148* 175*  ALKPHOS 1,158* 1,089* 1,064* 1,304*  PROT 7.2 6.9 5.8* 6.1*  ALBUMIN 3.1* 2.9* 2.5* 2.4*    TUMOR MARKERS: No results for input(s): AFPTM, CEA, CA199, CHROMGRNA in the last 8760 hours.  Assessment and Plan: 84 y.o. male with past medical history significant for chronic kidney disease, colon cancer with prior hemicolectomy, dementia, diabetes, stroke, hypertension, hyperlipidemia, MI, seizure disorder, prostate cancer recently admitted to  [facility] with persistent abdominal pain, diminished appetite and subsequently found to have worsening kidney function, elevated liver function tests and orthostatic hypotension. Recent imaging studies have revealed intrahepatic and extrahepatic biliary ductal dilatation with pancreatic ductal dilatation and sludge within common bile duct. He was evaluated by GI service and underwent ERCP today which had to be aborted secondary to hemodynamic instability.  Request now received for PTC with biliary drain placement.  Imaging studies were reviewed by Dr. Earleen Newport.  Current labs include WBC 4.0, hemoglobin 9.7, platelets 159 k, creatinine 11.29, PT 15.9, INR 1.28, total bilirubin 8.5.  Details/risks of procedure, including but not limited to, internal bleeding, infection/sepsis, injury to adjacent structures, and death discussed with patient and family with their understanding and consent. Procedure tentatively scheduled for later this afternoon. HEPARIN INJECTIONS WILL NEED TO BE HELD UNTIL AFTER ABOVE PROCEDURE.    Thank you for this interesting consult.  I greatly enjoyed meeting Coulton Silveria and look forward to participating in their care.  A copy of this report was sent to the requesting provider on this date.  Electronically Signed: D. Rowe Robert, PA-C 12/19/2017, 3:19 PM   I spent a total of 25 minutes  in face to face in clinical consultation, greater than 50% of which was counseling/coordinating  care for percutaneous transhepatic cholangiogram with biliary drain placement

## 2017-11-23 NOTE — Progress Notes (Signed)
Assumed care from previous nurse. Agree with previous nurses assessment. Will continue to monitor.

## 2017-11-23 NOTE — Progress Notes (Signed)
PROGRESS NOTE    [name]  HFW:[id number] DOB: 1931/03/12 DOA: 11/13/2017 PCP: Charolette Forward, MD    Brief Narrative:  84 y.o. male with medical history significant for hypertension, hyperlipidemia, type 2 diabetes, prostate cancer, colorectal cancer status post hemicolectomy, seizures, early dementia who presents on 11/03/2017 with complaints of abdominal pain and decreased appetite was found to have AK I, transaminitis, hyperbilirubinemia of unclear etiology.  [name] is unable to provide much history given his dementia.  He only states that he is noticed some abdominal pain and that he is not eating.  He says his belly pain is "neutral".  He does not specifically endorse a decreased appetite. Per facility report patient has been having abdominal pain with decreased appetite for 2 days.  Patient was noted to be orthostatic at the facility of the lying systolic of 92 which dropped to 72 upon sitting.   ED Course: Patient is afebrile, hemodynamically stable.  Lab workup significant for creatinine of 10.75, BUN 90, lipase 175, AST 216, ALT 164, alk phos 1158, total bilirubin 8.4.  Troponin 0 0.29.  WBC 4.4, hemoglobin 10.5. CT noncontrast was unremarkable. Given  significantly elevated creatinine, patient was admitted to hospitalist service   Assessment & Plan:   Active Problems:   Malignant neoplasm of cecum (HCC)   Hypertension   Type II diabetes mellitus with renal manifestations (HCC)   HLD (hyperlipidemia)   AKI (acute kidney injury) (HCC)   Transaminitis   Hyperbilirubinemia   Abdominal pain   Protein-calorie malnutrition, severe  Right upper quadrant abdominal pain, stable -Suspected gallstone pancreatitis. CT abd/pelvis reviewed -LFT's had remained stable -Total Bili had been trending down -Abd [place] reviewed. Findings of biliary ductal dilatation including pancreatic duct -GI consulted, ERCP was planned for 2/1, however patient's blood pressure became unstable and  procedure was cancelled -IR has been consulted for drain placement -Repeat comprehensive panel in AM  AKI on CKD, unclear etiology, suspect related to prerenal/dehydration -Baseline creatinine 1-1.2.  Seen in 05/2017 with a creatinine of 1.   -Cr on admission of 10.7 -Reported to have decreased appetite -Continue foley cath to monitor I/O -Continue to hold lisinopril per home meds and metformin -Calculated FENa of 1.2 - Appreciate Nephrology assistance - IVF now off. Renal function slightly worse from yesterday although urine appears somewhat more clear - Will repeat bmet in AM  Transaminitis, hyperbilirubinemia, elevated alk phos, unclear etiology -Per above, CT and [place] reviewed -GI following, unable to do ERCP per above.  - LFT's stable currently  Elevated troponin -Pt is without chest pains -Suspect related to above renal failure -stable at present  Hypertension, controlled -Holding home lisinopril in setting of AKI -BP stable at this time  Hyperlipidemia - Hold home statin in the setting of transaminitis -Repeat LFT's in AM  DVT prophylaxis: Heparin subQ Code Status: Full Family Communication: Pt in room, called son over phone Disposition Plan: Uncertain at this time  Consultants:   Nephrology  GI  Procedures:     Antimicrobials: Anti-infectives (From admission, onward)   Start     Dose/Rate Route Frequency Ordered Stop   11/29/2017 1630  ciprofloxacin (CIPRO) IVPB 400 mg     400 mg 200 mL/hr over 60 Minutes Intravenous To Radiology 12/06/2017 1533 12-01-17 1630      Subjective: Without complaints currently. Mildly sedated from attempted procedure  Objective: Vitals:   12/13/2017 1310 12/12/2017 1324 11/26/2017 1330 12/16/2017 1519  BP: (!) 99/52 (!) 107/50 (!) 114/57 118/62  Pulse: 69 67  68 70  Resp: '14 12 11 12  '$ Temp: 97.7 F (36.5 C)   (!) 97.5 F (36.4 C)  TempSrc: Oral   Oral  SpO2: 100% 100% 100% 96%  Weight:      Height:         Intake/Output Summary (Last 24 hours) at 12/20/2017 1553 Last data filed at 11/29/2017 1304 Gross per 24 hour  Intake 1207.5 ml  Output 85 ml  Net 1122.5 ml   Filed Weights   11/09/2017 1850 11/21/17 0409 12/02/2017 1101  Weight: 57.7 kg (127 lb 4.8 oz) 58.1 kg (128 lb) 58.1 kg (128 lb)    Examination: General exam: Conversant, in no acute distress Respiratory system: normal chest rise, clear, no audible wheezing Cardiovascular system: regular rhythm, s1-s2 Gastrointestinal system: Nondistended, nontender, pos BS Central nervous system: No seizures, no tremors Extremities: No cyanosis, no joint deformities Skin: No rashes, no pallor Psychiatry: Affect normal // no auditory hallucinations   Data Reviewed: I have personally reviewed following labs and imaging studies  CBC: Recent Labs  Lab 11/02/2017 1424 11/21/17 0109 11/22/17 0514 12/19/2017 0435  WBC 4.4 4.3 3.8* 4.0  NEUTROABS 3.2  --   --   --   HGB 10.2* 9.7* 9.2* 9.7*  HCT 29.8* 27.4* 25.7* 26.6*  MCV 89.8 87.5 85.4 83.4  PLT 213 197 164 725   Basic Metabolic Panel: Recent Labs  Lab 11/07/2017 1424 11/21/17 0109 11/22/17 0514 12/11/2017 0435  NA 137 139 140 140  K 4.7 4.4 4.1 3.7  CL 102 104 106 98*  CO2 15* 15* 16* 25  GLUCOSE 199* 192* 267* 363*  BUN 90* 90* 96* 100*  CREATININE 10.75* 10.74* 11.06* 11.29*  CALCIUM 7.1* 6.8* 6.2* 6.8*  PHOS  --   --  9.1* 8.6*   GFR: Estimated Creatinine Clearance: 4 mL/min (A) (by C-G formula based on SCr of 11.29 mg/dL (H)). Liver Function Tests: Recent Labs  Lab 11/03/2017 1424 11/21/17 0109 11/22/17 0514 11/26/2017 0435  AST 216* 203* 240* 340*  ALT 164* 152* 148* 175*  ALKPHOS 1,158* 1,089* 1,064* 1,304*  BILITOT 8.4* 8.2* 7.6* 8.5*  PROT 7.2 6.9 5.8* 6.1*  ALBUMIN 3.1* 2.9* 2.5* 2.4*   Recent Labs  Lab 11/08/2017 1424  LIPASE 175*   No results for input(s): AMMONIA in the last 168 hours. Coagulation Profile: Recent Labs  Lab 11/17/2017 1932  INR 1.28    Cardiac Enzymes: Recent Labs  Lab 10/24/2017 1424 10/26/2017 1932 11/21/17 0109 11/21/17 0720  TROPONINI 0.29* 0.26* 0.25* 0.25*   BNP (last 3 results) No results for input(s): PROBNP in the last 8760 hours. HbA1C: No results for input(s): HGBA1C in the last 72 hours. CBG: Recent Labs  Lab 12/08/2017 1154  GLUCAP 276*   Lipid Profile: No results for input(s): CHOL, HDL, LDLCALC, TRIG, CHOLHDL, LDLDIRECT in the last 72 hours. Thyroid Function Tests: No results for input(s): TSH, T4TOTAL, FREET4, T3FREE, THYROIDAB in the last 72 hours. Anemia Panel: No results for input(s): VITAMINB12, FOLATE, FERRITIN, TIBC, IRON, RETICCTPCT in the last 72 hours. Sepsis Labs: Recent Labs  Lab 11/05/2017 1424  LATICACIDVEN 1.7    Recent Results (from the past 240 hour(s))  MRSA PCR Screening     Status: None   Collection Time: 11/21/17  6:02 AM  Result Value Ref Range Status   MRSA by PCR NEGATIVE NEGATIVE Final    Comment:        The GeneXpert MRSA Assay (FDA approved for NASAL specimens only), is one  component of a comprehensive MRSA colonization surveillance program. It is not intended to diagnose MRSA infection nor to guide or monitor treatment for MRSA infections.      Radiology Studies: Dg Ercp Biliary & Pancreatic Ducts  Result Date: 12/18/2017 CLINICAL DATA:  ERCP EXAM: ERCP TECHNIQUE: Multiple spot images obtained with the fluoroscopic device and submitted for interpretation post-procedure. FLUOROSCOPY TIME:  Fluoroscopy Time:  1 minute and 4 seconds Radiation Exposure Index (if provided by the fluoroscopic device): Number of Acquired Spot Images: 0 COMPARISON:  None. FINDINGS: The endoscope projects over the duodenum. A wire projects over the common bile duct. IMPRESSION: See above. These images were submitted for radiologic interpretation only. Please see the procedural report for the amount of contrast and the fluoroscopy time utilized. Electronically Signed   By: Marybelle Killings M.D.   On: 12/19/2017 13:16    Scheduled Meds: . calcium acetate  1,334 mg Oral TID WC  . [START ON 2017/12/09] heparin  5,000 Units Subcutaneous Q8H  . insulin aspart  0-9 Units Subcutaneous Q4H   Continuous Infusions: . sodium chloride Stopped (12/20/2017 1332)  . ciprofloxacin       LOS: 3 days   Marylu Lund, MD [facility] Pager [phone]  If 7PM-7AM, please contact night-coverage [email or web address] Password [facility] 12/03/2017, 3:53 PM

## 2017-11-23 NOTE — Anesthesia Procedure Notes (Signed)
Procedure Name: Intubation Date/Time: 12/06/2017 12:21 PM Performed by: Lind Covert, CRNA Pre-anesthesia Checklist: Patient identified, Emergency Drugs available, Patient being monitored, Suction available and Timeout performed Patient Re-evaluated:Patient Re-evaluated prior to induction Oxygen Delivery Method: Circle system utilized Preoxygenation: Pre-oxygenation with 100% oxygen Induction Type: IV induction Laryngoscope Size: Mac and 4 Grade View: Grade I Tube type: Oral Tube size: 7.5 mm Number of attempts: 1 Airway Equipment and Method: Stylet Placement Confirmation: ETT inserted through vocal cords under direct vision,  positive ETCO2 and breath sounds checked- equal and bilateral Secured at: 22 cm Tube secured with: Tape Dental Injury: Teeth and Oropharynx as per pre-operative assessment

## 2017-11-23 NOTE — Plan of Care (Signed)
  Progressing Education: Knowledge of General Education information will improve 12/10/2017 2228 - Progressing by Talbert Forest, RN Pain Managment: General experience of comfort will improve 12/12/2017 2228 - Progressing by Talbert Forest, RN Safety: Ability to remain free from injury will improve 12/16/2017 2228 - Progressing by Talbert Forest, RN

## 2017-11-23 NOTE — Discharge Instructions (Signed)
Surgical Drain Home Care °Surgical drains are used to remove extra fluid that normally builds up in a surgical wound after surgery. A surgical drain helps to heal a surgical wound. Different kinds of surgical drains include: °· Active drains. These drains use suction to pull drainage away from the surgical wound. Drainage flows through a tube to a container outside of the body. It is important to keep the bulb or the drainage container flat (compressed) at all times, except while you empty it. Flattening the bulb or container creates suction. The two most common types of active drains are bulb drains and Hemovac drains. °· Passive drains. These drains allow fluid to drain naturally, by gravity. Drainage flows through a tube to a bandage (dressing) or a container outside of the body. Passive drains do not need to be emptied. The most common type of passive drain is the Penrose drain. ° °A drain is placed during surgery. Immediately after surgery, drainage is usually bright red and a little thicker than water. The drainage may gradually turn yellow or pink and become thinner. It is likely that your health care provider will remove the drain when the drainage stops or when the amount decreases to 1-2 Tbsp (15-30 mL) during a 24-hour period. °How to care for your surgical drain °· Keep the skin around the drain dry and covered with a dressing at all times. °· Check your drain area every day for signs of infection. Check for: °? More redness, swelling, or pain. °? Pus or a bad smell. °? Cloudy drainage. °Follow instructions from your health care provider about how to take care of your drain and how to change your dressing. Change your dressing at least one time every day. Change it more often if needed to keep the dressing dry. Make sure you: °1. Gather your supplies, including: °? Tape. °? Germ-free cleaning solution (sterile saline). °? Split gauze drain sponge: 4 x 4 inches (10 x 10 cm). °? Gauze square: 4 x 4 inches  (10 x 10 cm). °2. Wash your hands with soap and water before you change your dressing. If soap and water are not available, use hand sanitizer. °3. Remove the old dressing. Avoid using scissors to do that. °4. Use sterile saline to clean your skin around the drain. °5. Place the tube through the slit in a drain sponge. Place the drain sponge so that it covers your wound. °6. Place the gauze square or another drain sponge on top of the drain sponge that is on the wound. Make sure the tube is between those layers. °7. Tape the dressing to your skin. °8. If you have an active bulb or Hemovac drain, tape the drainage tube to your skin 1-2 inches (2.5-5 cm) below the place where the tube enters your body. Taping keeps the tube from pulling on any stitches (sutures) that you have. °9. Wash your hands with soap and water. °10. Write down the color of your drainage and how often you change your dressing. ° °How to empty your active bulb or Hemovac drain °1. Make sure that you have a measuring cup that you can empty your drainage into. °2. Wash your hands with soap and water. If soap and water are not available, use hand sanitizer. °3. Gently move your fingers down the tube while squeezing very lightly. This is called stripping the tube. This clears any drainage, clots, or tissue from the tube. °? Do not pull on the tube. °? You may need to strip   the tube several times every day to keep the tube clear. °4. Open the bulb cap or the drain plug. Do not touch the inside of the cap or the bottom of the plug. °5. Empty all of the drainage into the measuring cup. °6. Compress the bulb or the container and replace the cap or the plug. To compress the bulb or the container, squeeze it firmly in the middle while you close the cap or plug the container. °7. Write down the amount of drainage that you have in each 24-hour period. If you have less than 2 Tbsp (30 mL) of drainage during 24 hours, contact your health care  provider. °8. Flush the drainage down the toilet. °9. Wash your hands with soap and water. °Contact a health care provider if: °· You have more redness, swelling, or pain around your drain area. °· The amount of drainage that you have is increasing instead of decreasing. °· You have pus or a bad smell coming from your drain area. °· You have a fever. °· You have drainage that is cloudy. °· There is a sudden stop or a sudden decrease in the amount of drainage that you have. °· Your tube falls out. °· Your active drain does not stay compressed after you empty it. °This information is not intended to replace advice given to you by your health care provider. Make sure you discuss any questions you have with your health care provider. °Document Released: 10/06/2000 Document Revised: 03/16/2016 Document Reviewed: 04/28/2015 °Elsevier Interactive Patient Education © 2018 [employer] ° °

## 2017-11-23 NOTE — Care Management Important Message (Signed)
Important Message  Patient Details  Name: [name] MRN: [id number] Date of Birth: 10/28/1930   Medicare Important Message Given:  Yes    Kerin Salen 12/09/2017, 11:09 AMImportant Message  Patient Details  Name: [name] MRN: [id number] Date of Birth: [birth year]   Medicare Important Message Given:  Yes    Kerin Salen 12/20/2017, 11:09 AM

## 2017-11-23 NOTE — Anesthesia Preprocedure Evaluation (Signed)
Anesthesia Evaluation  Patient identified by MRN, date of birth, ID band Patient awake    Reviewed: Allergy & Precautions, NPO status , Patient's Chart, lab work & pertinent test results  History of Anesthesia Complications Negative for: history of anesthetic complications  Airway Mallampati: II  TM Distance: >3 FB Neck ROM: Full    Dental  (+) Teeth Intact   Pulmonary neg shortness of breath, neg sleep apnea, neg COPD, neg recent URI, former smoker,  88-92 on 2L [place]  shallow   + decreased breath sounds      Cardiovascular hypertension, + Past MI   Rhythm:Regular  bp meds held   Neuro/Psych Seizures -,  PSYCHIATRIC DISORDERS dementia CVA    GI/Hepatic Gallstone pancreatitis    Endo/Other  diabetes, Type 2, Insulin Dependent  Renal/GU ARFRenal diseasearf with cr trending up, >11     Musculoskeletal   Abdominal   Peds  Hematology  (+) anemia ,   Anesthesia Other Findings   Reproductive/Obstetrics                             Anesthesia Physical Anesthesia Plan  ASA: IV  Anesthesia Plan: General   Post-op Pain Management:    Induction: Intravenous  PONV Risk Score and Plan: 2 and Ondansetron  Airway Management Planned: Oral ETT  Additional Equipment: None  Intra-op Plan:   Post-operative Plan: Extubation in OR and Possible Post-op intubation/ventilation  Informed Consent: I have reviewed the patients History and Physical, chart, labs and discussed the procedure including the risks, benefits and alternatives for the proposed anesthesia with the patient or authorized representative who has indicated his/her understanding and acceptance.   Dental advisory given  Plan Discussed with: CRNA and Surgeon  Anesthesia Plan Comments:         Anesthesia Quick Evaluation

## 2017-11-23 NOTE — Anesthesia Postprocedure Evaluation (Signed)
Anesthesia Post Note  Patient: [name]  Procedure(s) Performed: ENDOSCOPIC RETROGRADE CHOLANGIOPANCREATOGRAPHY (ERCP) (N/A )     Patient location during evaluation: Endoscopy Anesthesia Type: General Level of consciousness: awake and patient cooperative Pain management: pain level controlled Vital Signs Assessment: post-procedure vital signs reviewed and stable Respiratory status: spontaneous breathing, nonlabored ventilation, respiratory function stable and patient connected to nasal cannula oxygen Cardiovascular status: blood pressure returned to baseline and stable Postop Assessment: no apparent nausea or vomiting Anesthetic complications: no    Last Vitals:  Vitals:   11/25/2017 1324 12/11/2017 1330  BP: (!) 107/50 (!) 114/57  Pulse: 67 68  Resp: 12 11  Temp:    SpO2: 100% 100%    Last Pain:  Vitals:   12/01/2017 1310  TempSrc: Oral  PainSc:                  [name]

## 2017-11-23 NOTE — Progress Notes (Signed)
[facility] Rounding Note  Subjective/Interval History:  N/V episode yesterday w/some loose stools per notes Reduced IVF yesterday to 75 Continues to lie flat without SOB Sats 100% RA UOP MAY be picking up (has cleared some0   Objective Vital signs in last 24 hours: Vitals:   11/22/17 0943 11/22/17 1326 11/22/17 2019 12/10/2017 0436  BP: (!) 109/58 114/61 (!) 111/59 111/67  Pulse: (!) 59 69 64 64  Resp:   17 17  Temp: 98 F (36.7 C) 98.5 F (36.9 C) 98.9 F (37.2 C) 98.8 F (37.1 C)  TempSrc: Oral Oral Oral Oral  SpO2: 100% 100% 99% 97%  Weight:      Height:       Weight change:   Intake/Output Summary (Last 24 hours) at 12/19/2017 0659 Last data filed at 12/17/2017 0440 Gross per 24 hour  Intake 240 ml  Output 115 ml  Net 125 ml   Physical Exam:  Blood pressure 111/67, pulse 64, temperature 98.8 F (37.1 C), temperature source Oral, resp. rate 17, height '5\' 9"'$  (1.753 m), weight 58.1 kg (128 lb), SpO2 97 %. Small framed light skinned AAM. NAD Remains alert VS as noted JVP ~6 cm Lungs remain entirely clear S1S2 No S3 No murmur Abdomen mild distention, minimal pain in RUQ with palpation + BS No edema whatsoever Urine lighter brown, ~115 cc out yesterday No asterixus   Recent Labs  Lab 11/03/2017 1424 11/21/17 0109 11/22/17 0514 12/19/2017 0435  NA 137 139 140 140  K 4.7 4.4 4.1 3.7  CL 102 104 106 98*  CO2 15* 15* 16* 25  GLUCOSE 199* 192* 267* 363*  BUN 90* 90* 96* 100*  CREATININE 10.75* 10.74* 11.06* 11.29*  CALCIUM 7.1* 6.8* 6.2* 6.8*  PHOS  --   --  9.1* 8.6*    Recent Labs  Lab 11/21/17 0109 11/22/17 0514 11/30/2017 0435  AST 203* 240* 340*  ALT 152* 148* 175*  ALKPHOS 1,089* 1,064* 1,304*  BILITOT 8.2* 7.6* 8.5*  PROT 6.9 5.8* 6.1*  ALBUMIN 2.9* 2.5* 2.4*   Recent Labs  Lab 11/14/2017 1424  LIPASE 175*    Recent Labs  Lab 10/27/2017 1424 11/21/17 0109 11/22/17 0514 12/19/2017 0435  WBC 4.4 4.3 3.8* 4.0  NEUTROABS 3.2  --   --   --   HGB 10.2*  9.7* 9.2* 9.7*  HCT 29.8* 27.4* 25.7* 26.6*  MCV 89.8 87.5 85.4 83.4  PLT 213 197 164 159    Recent Labs  Lab 11/02/2017 1424 10/27/2017 1932 11/21/17 0109 11/21/17 0720  TROPONINI 0.29* 0.26* 0.25* 0.25*   Medications: . dextrose 5 % 850 mL with sodium bicarbonate 150 mEq infusion 75 mL/hr at 12/15/2017 0429   . calcium acetate  1,334 mg Oral TID WC  . heparin  5,000 Units Subcutaneous Q8H   Background: 84 y.o. year-old with PMH DM, HTN (on ACE), h/o colorectal CA with hemicolectomy in the past, prostate CA, h/o stroke, h/o seizure, and early dementia. Resides at [facility]. Presented with abdominal pain, reduced appetite. Found to have elevated lipase(175), AST (216), ALT (164) , and alk phos (1158) and unremakable CT non contrast. Asked to see because of creatinine 10.75 and bicarb of 15. Oliguric despite volume. Noted to be hypotensive/orthostatic at his facility prior to transfer with lying BP~90. Was on lisinopril and metformin. (Baseline creatinine 1.03 05/2017)   Assessment/Recommendations  1. AKI - suspect severe volume depletion and hypotension for unknown duration PTA with lisinopril on board; making a tiny amount of  urine (clearing some).  1. Volume replete now post fluids - slow to 50/hour 2. Continue to trend labs/careful physical exams. 3. Remains without dialysis indications so far 4. Given normal renal function at baseline would hope for recovery at some point   2. Metabolic acidosis - 2/2 #1 + metformin. 1. Resolved . Stop bicarb drip 3. Hypocalcemia/hyperphosphatemia - added ca acetate and ca some better today  4. Hypotension - Most systolics now >829 5. RUQ pain - felt to be probable gallstone pancreatitis. LFT's and lipase elevated and continue to rise. Plans for ERCP today. (Dr. Benson Norway) 6. HTN - HYPOtensive;  PTA on lisinopril (holding of course). BP remains low normal on no medications 7. DM - holding metformin 8. Anemia - Hb just under 10. Check iron  studies 9. Dementia - not sure of severity. Like the primary care, I have had very reasonable conversations with him everyday despite his severe azotemia.     Jamal Maes, MD [facility] [phone] pager 11/27/2017, 6:59 AM

## 2017-11-23 NOTE — Procedures (Signed)
Pre procedural Dx: [name] stone pancreatitis;Obstructive Juandice Post procedural Dx: Same  Successful placement of a right hepatic approach transhepatic 10 Fr biliary drainage catheter with end coiled and locked within the duodenum.   Biliary drain connected to gravity bag.  EBL: Minimal  Complications: None immediate  Keith Bacon, MD Pager #: [phone]

## 2017-11-23 NOTE — Sedation Documentation (Signed)
Pyxis will not allow me to log in.  Wasted Fent 50mcg and Versed .5 mg. Jill Side RT witnessed

## 2017-11-23 NOTE — Interval H&P Note (Signed)
History and Physical Interval Note:  12/08/2017 11:33 AM  [name]  has presented today for surgery, with the diagnosis of Gallstone pancreatitis  The various methods of treatment have been discussed with the patient and family. After consideration of risks, benefits and other options for treatment, the patient has consented to  Procedure(s): ENDOSCOPIC RETROGRADE CHOLANGIOPANCREATOGRAPHY (ERCP) (N/A) as a surgical intervention .  The patient's history has been reviewed, patient examined, no change in status, stable for surgery.  I have reviewed the patient's chart and labs.  Questions were answered to the patient's satisfaction.     [name] D

## 2017-11-23 DEATH — deceased

## 2017-11-24 ENCOUNTER — Inpatient Hospital Stay (HOSPITAL_COMMUNITY): Payer: Medicare Other | Admitting: Certified Registered Nurse Anesthetist

## 2017-11-24 ENCOUNTER — Inpatient Hospital Stay (HOSPITAL_COMMUNITY): Payer: Medicare Other

## 2017-11-24 DIAGNOSIS — R579 Shock, unspecified: Secondary | ICD-10-CM

## 2017-11-24 DIAGNOSIS — Z8674 Personal history of sudden cardiac arrest: Secondary | ICD-10-CM

## 2017-11-24 DIAGNOSIS — E785 Hyperlipidemia, unspecified: Secondary | ICD-10-CM

## 2017-11-24 DIAGNOSIS — J9602 Acute respiratory failure with hypercapnia: Secondary | ICD-10-CM

## 2017-11-24 DIAGNOSIS — R740 Nonspecific elevation of levels of transaminase and lactic acid dehydrogenase [LDH]: Secondary | ICD-10-CM

## 2017-11-24 DIAGNOSIS — N179 Acute kidney failure, unspecified: Secondary | ICD-10-CM

## 2017-11-24 DIAGNOSIS — J9601 Acute respiratory failure with hypoxia: Secondary | ICD-10-CM

## 2017-11-24 DIAGNOSIS — K831 Obstruction of bile duct: Secondary | ICD-10-CM

## 2017-11-24 LAB — GLUCOSE, CAPILLARY
GLUCOSE-CAPILLARY: 182 mg/dL — AB (ref 65–99)
GLUCOSE-CAPILLARY: 165 mg/dL — AB (ref 65–99)
GLUCOSE-CAPILLARY: 241 mg/dL — AB (ref 65–99)
GLUCOSE-CAPILLARY: 246 mg/dL — AB (ref 65–99)
GLUCOSE-CAPILLARY: 246 mg/dL — AB (ref 65–99)
Glucose-Capillary: 168 mg/dL — ABNORMAL HIGH (ref 65–99)
Glucose-Capillary: 220 mg/dL — ABNORMAL HIGH (ref 65–99)
Glucose-Capillary: 229 mg/dL — ABNORMAL HIGH (ref 65–99)
Glucose-Capillary: 240 mg/dL — ABNORMAL HIGH (ref 65–99)
Glucose-Capillary: 247 mg/dL — ABNORMAL HIGH (ref 65–99)

## 2017-11-24 LAB — BLOOD GAS, ARTERIAL
Acid-base deficit: 7.8 mmol/L — ABNORMAL HIGH (ref 0.0–2.0)
Bicarbonate: 17.4 mmol/L — ABNORMAL LOW (ref 20.0–28.0)
DRAWN BY: 11249
FIO2: 100
MECHVT: 570 mL
O2 SAT: 99.7 %
PCO2 ART: 36.2 mmHg (ref 32.0–48.0)
PEEP: 5 cmH2O
PH ART: 7.302 — AB (ref 7.350–7.450)
PO2 ART: 374 mmHg — AB (ref 83.0–108.0)
Patient temperature: 98.6
RATE: 14 resp/min

## 2017-11-24 LAB — COMPREHENSIVE METABOLIC PANEL
ALK PHOS: 897 U/L — AB (ref 38–126)
ALT: 366 U/L — AB (ref 17–63)
AST: 582 U/L — AB (ref 15–41)
Albumin: 1.5 g/dL — ABNORMAL LOW (ref 3.5–5.0)
Anion gap: 24 — ABNORMAL HIGH (ref 5–15)
BILIRUBIN TOTAL: 4.9 mg/dL — AB (ref 0.3–1.2)
BUN: 95 mg/dL — ABNORMAL HIGH (ref 6–20)
CALCIUM: 6.4 mg/dL — AB (ref 8.9–10.3)
CO2: 22 mmol/L (ref 22–32)
CREATININE: 11.09 mg/dL — AB (ref 0.61–1.24)
Chloride: 104 mmol/L (ref 101–111)
GFR calc Af Amer: 4 mL/min — ABNORMAL LOW (ref >60)
GFR, EST NON AFRICAN AMERICAN: 4 mL/min — AB (ref >60)
Glucose, Bld: 206 mg/dL — ABNORMAL HIGH (ref 65–99)
POTASSIUM: 4.2 mmol/L (ref 3.5–5.1)
Sodium: 150 mmol/L — ABNORMAL HIGH (ref 135–145)
TOTAL PROTEIN: 4.2 g/dL — AB (ref 6.5–8.1)

## 2017-11-24 LAB — PHOSPHORUS: PHOSPHORUS: 10.6 mg/dL — AB (ref 2.5–4.6)

## 2017-11-24 LAB — TROPONIN I: Troponin I: 0.43 ng/mL (ref <0.03)

## 2017-11-24 LAB — PREPARE RBC (CROSSMATCH)

## 2017-11-24 LAB — PROCALCITONIN: PROCALCITONIN: 1.95 ng/mL

## 2017-11-24 LAB — HEMOGLOBIN AND HEMATOCRIT, BLOOD
HEMATOCRIT: 15.8 % — AB (ref 39.0–52.0)
HEMOGLOBIN: 5.7 g/dL — AB (ref 13.0–17.0)

## 2017-11-24 LAB — LACTIC ACID, PLASMA: LACTIC ACID, VENOUS: 10.6 mmol/L — AB (ref 0.5–1.9)

## 2017-11-24 LAB — ABO/RH: ABO/RH(D): O POS

## 2017-11-24 LAB — IRON AND TIBC: Iron: 103 ug/dL (ref 45–182)

## 2017-11-24 MED ORDER — SODIUM CHLORIDE 0.9 % IV SOLN: INTRAVENOUS | Status: DC | PRN

## 2017-11-24 MED ORDER — PANTOPRAZOLE SODIUM 40 MG IV SOLR
40.0000 mg | Freq: Two times a day (BID) | INTRAVENOUS | Status: DC
  Administered 2017-11-24: 40 mg via INTRAVENOUS
  Filled 2017-11-24: qty 40

## 2017-11-24 MED ORDER — LACTATED RINGERS IV SOLN
INTRAVENOUS | Status: DC
  Administered 2017-11-24: 05:00:00 via INTRAVENOUS

## 2017-11-24 MED ORDER — SODIUM CHLORIDE 0.9 % IV SOLN
INTRAVENOUS | Status: DC
  Administered 2017-11-24: 1.1 [IU]/h via INTRAVENOUS
  Filled 2017-11-24 (×2): qty 1

## 2017-11-24 MED ORDER — SODIUM CHLORIDE 0.9 % IV SOLN: 250.0000 mL | INTRAVENOUS | Status: DC | PRN

## 2017-11-24 MED ORDER — FENTANYL CITRATE (PF) 100 MCG/2ML IJ SOLN: 50.0000 ug | INTRAMUSCULAR | Status: DC | PRN

## 2017-11-24 MED ORDER — ACETAMINOPHEN 325 MG PO TABS: 650.0000 mg | ORAL_TABLET | Freq: Four times a day (QID) | ORAL | Status: DC | PRN

## 2017-11-24 MED ORDER — DEXTROSE 5 % IV SOLN
0.5000 ug/min | INTRAVENOUS | Status: DC
  Administered 2017-11-24: 0.5 ug/min via INTRAVENOUS
  Administered 2017-11-24: 20 ug/min via INTRAVENOUS
  Administered 2017-11-24: 12 ug/min via INTRAVENOUS
  Filled 2017-11-24 (×4): qty 4

## 2017-11-24 MED ORDER — SODIUM CHLORIDE 0.9% FLUSH: 10.0000 mL | Freq: Two times a day (BID) | INTRAVENOUS | Status: DC

## 2017-11-24 MED ORDER — SODIUM CHLORIDE 0.9% FLUSH: 10.0000 mL | INTRAVENOUS | Status: DC | PRN

## 2017-11-24 MED ORDER — PANTOPRAZOLE SODIUM 40 MG IV SOLR: 40.0000 mg | Freq: Every day | INTRAVENOUS | Status: DC

## 2017-11-24 MED ORDER — ORAL CARE MOUTH RINSE
15.0000 mL | Freq: Four times a day (QID) | OROMUCOSAL | Status: DC
  Administered 2017-11-24: 15 mL via OROMUCOSAL

## 2017-11-24 MED ORDER — CHLORHEXIDINE GLUCONATE 0.12% ORAL RINSE (MEDLINE KIT)
15.0000 mL | Freq: Two times a day (BID) | OROMUCOSAL | Status: DC
  Administered 2017-11-24: 15 mL via OROMUCOSAL

## 2017-11-24 MED ORDER — LACTATED RINGERS IV SOLN: INTRAVENOUS | Status: DC

## 2017-11-24 MED ORDER — SODIUM CHLORIDE 0.9 % IV SOLN
Freq: Once | INTRAVENOUS | Status: AC
  Administered 2017-11-24: 06:00:00 via INTRAVENOUS

## 2017-11-24 MED ORDER — SODIUM BICARBONATE 8.4 % IV SOLN
INTRAVENOUS | Status: DC
  Administered 2017-11-24: 05:00:00 via INTRAVENOUS
  Filled 2017-11-24: qty 150

## 2017-11-24 MED ORDER — SODIUM CHLORIDE 0.9% FLUSH: 5.0000 mL | Freq: Three times a day (TID) | INTRAVENOUS | Status: DC

## 2017-11-24 MED ORDER — CHLORHEXIDINE GLUCONATE CLOTH 2 % EX PADS
6.0000 | MEDICATED_PAD | Freq: Every day | CUTANEOUS | Status: DC
  Administered 2017-11-24: 6 via TOPICAL

## 2017-11-24 MED ORDER — NOREPINEPHRINE BITARTRATE 1 MG/ML IV SOLN
0.0000 ug/min | INTRAVENOUS | Status: DC
  Administered 2017-11-24 (×5): 30 ug/min via INTRAVENOUS
  Filled 2017-11-24 (×5): qty 4

## 2017-11-24 MED FILL — Medication: Qty: 1 | Status: AC

## 2017-11-26 ENCOUNTER — Encounter (HOSPITAL_COMMUNITY): Payer: Self-pay | Admitting: Gastroenterology

## 2017-11-26 LAB — TYPE AND SCREEN
ABO/RH(D): O POS
ANTIBODY SCREEN: NEGATIVE
Unit division: 0
Unit division: 0

## 2017-11-26 LAB — BPAM RBC
Blood Product Expiration Date: 201902282359
Blood Product Expiration Date: 201902282359
ISSUE DATE / TIME: 201902020511
ISSUE DATE / TIME: 201902020805
UNIT TYPE AND RH: 5100
Unit Type and Rh: 5100

## 2017-11-27 LAB — ECHOCARDIOGRAM COMPLETE
AVLVOTPG: 8 mmHg
CHL CUP MV DEC (S): 164
EWDT: 164 ms
FS: 50 % — AB (ref 28–44)
HEIGHTINCHES: 69 in
IV/PV OW: 1.25
LADIAMINDEX: 1.07 cm/m2
LASIZE: 19 mm
LAVOL: 64.1 mL
LAVOLA4C: 72.3 mL
LAVOLIN: 36 mL/m2
LEFT ATRIUM END SYS DIAM: 19 mm
LV PW d: 8 mm — AB (ref 0.6–1.1)
LV TDI E'LATERAL: 10
LV TDI E'MEDIAL: 7.29
LVELAT: 10 cm/s
LVOT SV: 49 mL
LVOT VTI: 21.6 cm
LVOT area: 2.27 cm2
LVOT diameter: 17 mm
LVOTPV: 139 cm/s
Lateral S' vel: 12 cm/s
MV pk E vel: 0.5 m/s
TAPSE: 22.2 mm
WEIGHTICAEL: 2264.57 [oz_av]

## 2017-12-21 NOTE — Progress Notes (Signed)
PULMONARY / CRITICAL CARE MEDICINE   Name: [name] MRN: [id number] DOB: 1931/04/14    ADMISSION DATE:  11/10/2017 CONSULTATION DATE:  2017-12-01  REFERRING MD:  Dr. Wyline Copas  CHIEF COMPLAINT:  Cardiac Arrest   HISTORY OF PRESENT ILLNESS:   84 year old male with PMH of CKD stage 3, Colon CA s/p hemicolectomy, prostate CA, Dementia, HLD, HTN, CVA (2018), Seizures, Type 2 DM  P[facility] Hospital [facility] 1/[facility] with right upper quadrant abd pain and decreased appetite for the last 2 says with noted orthostatic hypotension. Upon arrival Crt 10.75, BUN 90, Lipase 175, AST 216, ALT 164, alk phos 1158, total bilirubin 8.4, CT A/P negative. ABD U/S with intrahepatic and CBD dilation. Taken for ERCP on 2/1,however, aborted due to hemodynamic instability. Taken to IR for biliary drain placement done pm 12/06/2017    On 2/2 patient was found unresponsive and in PEA. Given 3 EPI and 2 Bicarb. Intubated. Transferred to ICU. PCCM asked to consult       SUBJECTIVE:  No response to verbal/ breathing some above vent   VITAL SIGNS: BP 112/80   Pulse 79   Temp (!) 94.8 F (34.9 C) (Rectal)   Resp 18   Ht 5' 9" (1.753 m)   Wt 141 lb 8.6 oz (64.2 kg)   SpO2 100%   BMI 20.90 kg/m   HEMODYNAMICS:    VENTILATOR SETTINGS: Vent Mode: PRVC FiO2 (%):  [40 %-100 %] 40 % Set Rate:  [14 bmp] 14 bmp Vt Set:  [570 mL] 570 mL PEEP:  [5 cmH20] 5 cmH20 Plateau Pressure:  [10 cmH20-12 cmH20] 10 cmH20  INTAKE / OUTPUT: I/O last 3 completed shifts: In: 1574.6 [P.O.:50; I.V.:1494.6; Blood:30] Out: 275 [Urine:25; Drains:250]  PHYSICAL EXAMINATION: General:  Thin and cachectic/ et in place  No jvd Oropharynx clear Neck supple Lungs with a few scattered exp > insp rhonchi bilaterally RRR no s3 or or sign murmur Abd soft Extr wam with no edema or clubbing noted Neuro  No response to verbal   LABS:  BMET Recent Labs  Lab 11/22/17 0514 12/03/2017 0435 12-01-17 0329  NA 140 140 150*  K 4.1  3.7 4.2  CL 106 98* 104  CO2 16* 25 22  BUN 96* 100* 95*  CREATININE 11.06* 11.29* 11.09*  GLUCOSE 267* 363* 206*    Electrolytes Recent Labs  Lab 11/22/17 0514 12/11/2017 0435 12/01/17 0329  CALCIUM 6.2* 6.8* 6.4*  PHOS 9.1* 8.6* 10.6*    CBC Recent Labs  Lab 11/21/17 0109 11/22/17 0514 12/11/2017 0435 12/01/17 0323  WBC 4.3 3.8* 4.0  --   HGB 9.7* 9.2* 9.7* 5.7*  HCT 27.4* 25.7* 26.6* 15.8*  PLT 197 164 159  --     Coag's Recent Labs  Lab 10/25/2017 1932  INR 1.28    Sepsis Markers Recent Labs  Lab 11/21/2017 1424 2017/12/01 0353  LATICACIDVEN 1.7 10.6*  PROCALCITON  --  1.95    ABG Recent Labs  Lab 12/01/17 0413  PHART 7.302*  PCO2ART 36.2  PO2ART 374*    Liver Enzymes Recent Labs  Lab 11/22/17 0514 12/20/2017 0435 12-01-17 0329  AST 240* 340* 582*  ALT 148* 175* 366*  ALKPHOS 1,064* 1,304* 897*  BILITOT 7.6* 8.5* 4.9*  ALBUMIN 2.5* 2.4* 1.5*    Cardiac Enzymes Recent Labs  Lab 11/21/17 0109 11/21/17 0720 Dec 01, 2017 0322  TROPONINI 0.25* 0.25* 0.43*    Glucose Recent Labs  Lab December 01, 2017 0107 01-Dec-2017 0306 2017/12/01 0519 Dec 01, 2017 2458  Dec 02, 2017 0807 02-Dec-2017 0911  GLUCAP 182* 168* 165* 220* 229* 240*    Imaging Dg Chest Port 1 View  Result Date: 02-Dec-2017 CLINICAL DATA:  Post code. EXAM: PORTABLE CHEST 1 VIEW COMPARISON:  11/15/2017 FINDINGS: An endotracheal tube has been placed with tip measuring 5.6 cm above the carina. An enteric tube was placed with tip off the field of view but below the left hemidiaphragm. Heart size and pulmonary vascularity are normal. Small left pleural effusion, new since previous study. No airspace disease or consolidation. No pneumothorax. Calcification of the aorta. IMPRESSION: Appliances appear in satisfactory position.  Aortic atherosclerosis. Electronically Signed   By: Lucienne Capers M.D.   On: 2017/12/02 04:37   Dg Ercp Biliary & Pancreatic Ducts  Result Date: 12/19/2017 CLINICAL DATA:  ERCP EXAM:  ERCP TECHNIQUE: Multiple spot images obtained with the fluoroscopic device and submitted for interpretation post-procedure. FLUOROSCOPY TIME:  Fluoroscopy Time:  1 minute and 4 seconds Radiation Exposure Index (if provided by the fluoroscopic device): Number of Acquired Spot Images: 0 COMPARISON:  None. FINDINGS: The endoscope projects over the duodenum. A wire projects over the common bile duct. IMPRESSION: See above. These images were submitted for radiologic interpretation only. Please see the procedural report for the amount of contrast and the fluoroscopy time utilized. Electronically Signed   By: Marybelle Killings M.D.   On: 11/30/2017 13:16   Ir Biliary Drain Placement With Cholangiogram  Result Date: 12/10/2017 INDICATION: History of gallstone pancreatitis with elevated LFTs, post failed attempted ERCP. Request made for placement of a internal-external biliary drainage catheter. EXAM: ULTRASOUND AND FLUOROSCOPIC GUIDED PERCUTANEOUS TRANSHEPATIC CHOLANGIOGRAM AND BILIARY TUBE PLACEMENT COMPARISON:  CT abdomen and pelvis - 10/27/2017; abdominal ultrasound - 11/12/2017; ERCP - earlier same day MEDICATIONS: Ciprofloxacin 400 mg IV; the antibiotic was administered with an appropriate time frame prior to the initiation of the procedure. CONTRAST:  50 cc Isovue 300, administered into the biliary tree. ANESTHESIA/SEDATION: Moderate (conscious) sedation was employed during this procedure. A total of Versed 1.5 mg and Fentanyl 150 mcg was administered intravenously. Moderate Sedation Time: 60 minutes. The patient's level of consciousness and vital signs were monitored continuously by radiology nursing throughout the procedure under my direct supervision. FLUOROSCOPY TIME:  21 minutes 48 seconds. COMPLICATIONS: None immediate. TECHNIQUE: Informed written consent was obtained from the patient's family after a discussion of the risks, benefits and alternatives to treatment. Questions regarding the procedure were encouraged  and answered. A timeout was performed prior to the initiation of the procedure. The right upper abdominal quadrant was prepped and draped in the usual sterile fashion, and a sterile drape was applied covering the operative field. Maximum barrier sterile technique with sterile gowns and gloves were used for the procedure. A timeout was performed prior to the initiation of the procedure. Ultrasound scanning of the right upper abdominal quadrant was performed to delineate the anatomy and avoid transgression of the gallbladder or the pleural. A spot along the right mid axillary line was marked fluoroscopically inferior to the right costophrenic angle. Ultrasound scanning failed to delineate significant peripheral intrahepatic biliary duct dilatation. Several attempts were made to cannulate a peripheral bile duct however ultimately this proved unsuccessful As such, under direct ultrasound guidance, a more central biliary duct was successfully cannulated with contrast injection confirming opacification of the biliary tree. Next, a 7 gauge Chiba needle was utilized to cannulate a now opacified mildly dilated peripheral duct within the right lobe of the liver. Appropriate puncture was confirmed with cannulation of the  duct with a Nitrex wire to the central aspect of the biliary tree. Several spot radiographic fluoroscopic images were obtained in various obliquities confirming appropriate access. Next, the track was dilated an Accustick set. Through the outer catheter of the Accustick set, both irregular and ultimately a stiff glidewire was utilized to advance a 4 French angled glide catheter through the distal aspect of the CBD and into the duodenum. Contrast injection confirmed appropriate positioning. Over an Amplatz stiff wire, the tract was dilated and a 10.2 [place] biliary drainage catheter was advanced with coil ultimately locked within the duodenum. Contrast was injected and a completion radiograph was obtained. The  catheter was connected to a drainage bag which yielded the brisk return of dark black bile. The catheter was secured to the skin with an interrupted suture. The patient tolerated the procedure well without immediate postprocedural complication. FINDINGS: Ultrasound scanning failed to delineate significant dilatation the peripheral aspect of the biliary tree. Note is made of a small amount of perihepatic ascites. As such, utilizing a 2 stick technique as detailed above, a mildly dilated peripheral duct within the right lobe of the liver was cannulated allowing advancement of a 10 French percutaneous biliary drainage catheter with end ultimately coiled and locked within the duodenum. Note is made a small to moderate length narrowing of the distal aspect of the CBD, incompletely evaluated given limited cholangiographic images obtained at the time of initial internal/external biliary drainage catheter placement. IMPRESSION: 1. Successful placement of a 10.2 French percutaneous biliary drainage catheter with end coiled and locked within the duodenum. 2. Note is made a small to moderate length narrowing of the distal aspect of the CBD, incompletely evaluated given limited cholangiographic images obtained at the time of initial internal/external biliary drainage catheter placement. 3. Note made of a small amount of perihepatic ascites. Electronically Signed   By: Sandi Mariscal M.D.   On: 12/12/2017 18:49     CULTURES: MRSA PCR 1/30 > Negative   ANTIBIOTICS: None.   SIGNIFICANT EVENTS: 1/29 > Presents to ED  2/2 > Cardiac Arrest   LINES/TUBES: ETT 2/2 >>  DISCUSSION: 84 year old male presented to ED on 1/29 with Choledocholithiasis. Had biliary drain placed 2/1 by IR. 2/2 found unresponsive. PEA arrest. Given 3 EPI and 2 Bicarb. Intubated and Transferred to ICU.   ASSESSMENT / PLAN:  PULMONARY A: Acute Hypoxic Respiratory Distress  P:  No change vent for now other than titrate fio2 for sats >  95%    CARDIOVASCULAR A:  PEA Arrest  H/O HTN, HLD  Combination of hypovolemic septic and possibly hemorrhagic shock P:  Maxed out on levophed/ epi with transfusion in progress but may need to limit this based on futility if don't see response given his mulitple co-morbids and DNR status/ clearly not op candidate     RENAL A:   Acute on Chronic Kidney Disease (Baseline Crt 1.0), suspected secondary to severe volume depletion and hypotension on ACEi pre-admit   P:   Pre renal/ not much to offer here   GASTROINTESTINAL A:   Choledocholithiasis s/p Biliary Drain placement  ABD U/S with intrahepatic and CBD dilation    ERCP 2/1 aborted due to instability > IR performed biliary stent  Transaminitis  P:   GI Following   rx PPI/ blood products for now  HEMATOLOGIC A:   Anemia  H/O Prostate and Colon CA  P:   acute ugi bleed/ rx ppi / prbc's and d/c heparin   INFECTIOUS A:  Choledocholithiasis  with transaminitis P:   Trend WBC and Fever Curve Trend PCT and LA  PAN Culture  Follow Culture Data  Not currently on antibiotics Observe PCT trend to determine if they should be started  ENDOCRINE A:   DM    P:   Insulin drip      NEUROLOGIC A:   H/O Dementia (Unsure severity), Seizure Disorder   P:   Follow serial exam   FAMILY   - not at bedside, discussed per CCM coverage already this am    Really nothing else to offer at this point unless GI/ renal feel otherwise    Christinia Gully, MD Pulmonary and [place] [phone] After 5:30 PM or weekends, use [facility] [phone]

## 2017-12-21 NOTE — Progress Notes (Signed)
  Echocardiogram 2D Echocardiogram has been performed.  [name] F 07-Dec-2017, 10:46 AM

## 2017-12-21 NOTE — ED Provider Notes (Signed)
[facility]  Department of Emergency Medicine   Code Blue CONSULT NOTE  Chief Complaint: Cardiac arrest/unresponsive   Level V Caveat: Unresponsive  History of present illness: I was contacted by the hospital for a CODE BLUE cardiac arrest upstairs and presented to the patient's bedside.  Patient found to be pulses and in cardiac arrest Patient admitted for AKI and biliary drain.     ROS: Unable to obtain, Level V caveat  Scheduled Meds: . calcium acetate  1,334 mg Oral TID WC  . heparin  5,000 Units Subcutaneous Q8H  . insulin aspart  0-9 Units Subcutaneous Q4H   Continuous Infusions: . sodium chloride 50 mL/hr at 12/02/2017 2128  . ciprofloxacin    . epinephrine    .  sodium bicarbonate  infusion 1000 mL     PRN Meds:.acetaminophen, morphine injection, ondansetron (ZOFRAN) IV, polyethylene glycol Past Medical History:  Diagnosis Date  . Acute metabolic encephalopathy 0/96/0454  . CKD (chronic kidney disease), stage III ([place]) 06/14/2017  . Colon cancer ([place])    colon ca dx 7/11  . Dementia without behavioral disturbance 06/26/2017  . Diabetes mellitus   . Diabetic nephropathy ([place])   . History of stroke 06/26/2017  . Hyperlipemia   . Hypertension   . Iron deficiency anemia 06/26/2017  . Malignant neoplasm of cecum ([place]) 08/27/2011  . MI (myocardial infarction) ([place])   . 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 Prostate CA [facility])    prostate  . Stroke ([place])   . Type II diabetes mellitus with renal manifestations ([place]) 06/05/2012   Past Surgical History:  Procedure Laterality Date  . COLON SURGERY  06/2010   right colectomy, T3N2  . IR BILIARY DRAIN PLACEMENT WITH CHOLANGIOGRAM  12/04/2017  . PORT-A-CATH REMOVAL N/A 10/19/2014   Procedure: MINOR REMOVAL PORT-A-CATH;  Surgeon: Autumn Messing III, MD;  Location: [facility];  Service: General;  Laterality: N/A;  . PROSTATE SURGERY     Social History    Socioeconomic History  . Marital status: Divorced    Spouse name: Not on file  . Number of children: Not on file  . Years of education: Not on file  . Highest education level: Not on file  Social Needs  . Financial resource strain: Not on file  . Food insecurity - worry: Not on file  . Food insecurity - inability: Not on file  . Transportation needs - medical: Not on file  . Transportation needs - non-medical: Not on file  Occupational History  . Not on file  Tobacco Use  . Smoking status: Former Smoker    Packs/day: 0.25    Years: 3.00    Pack years: 0.75    Types: Cigarettes  . Smokeless tobacco: Never Used  Substance and Sexual Activity  . Alcohol use: No  . Drug use: No  . Sexual activity: No  Other Topics Concern  . Not on file  Social History Narrative   Admitted to [facility] 06/18/17   Divorced   Former smoker   Alcohol none   Full Code   Allergies  Allergen Reactions  . Penicillins Swelling    Has patient had a PCN reaction causing immediate rash, facial/tongue/throat swelling, SOB or lightheadedness with hypotension: Unknown Has patient had a PCN reaction causing severe rash involving mucus membranes or skin necrosis: Unknown Has patient had a PCN reaction that required hospitalization: Unknown Has patient had a PCN reaction occurring within the  last 10 years: Unknown If all of the above answers are "NO", then may proceed with Cephalosporin use.  Happened in childhood    Last set of Vital Signs (not current) Vitals:   12/17/2017 2010 12/17/2017 2128  BP: (!) 82/51 98/62  Pulse: 68 79  Resp: 16   Temp: 97.6 F (36.4 C)   SpO2: 95%       Physical Exam Gen: unresponsive Cardiovascular: pulseless  Resp: apneic. Breath sounds equal bilaterally with bagging  Abd: nondistended  Neuro: GCS 3, unresponsive to pain  HEENT: No blood in posterior pharynx, gag reflex absent  Neck: No crepitus  Musculoskeletal: No deformity  Skin:  warm   CRITICAL CARE Performed by: Carlisle Beers Total critical care time: 31 minutes Critical care time was exclusive of separately billable procedures and treating other patients. Critical care was necessary to treat or prevent imminent or life-threatening deterioration. Critical care was time spent personally by me on the following activities: development of treatment plan with patient and/or surrogate as well as nursing, discussions with consultants, evaluation of patient's response to treatment, examination of patient, obtaining history from patient or surrogate, ordering and performing treatments and interventions, ordering and review of laboratory studies, ordering and review of radiographic studies, pulse oximetry and re-evaluation of patient's condition.  MDM Reviewed: previous chart, nursing note and vitals Reviewed previous: labs and x-ray Interpretation: labs (elevated bilirubin and creatinine.  Glucoses have been slightly elevated ) Total time providing critical care: 30-74 minutes. This excludes time spent performing separately reportable procedures and services. Consults: admitting MD (Dr. Delford Field who is managing all admitted patients )   Cardiopulmonary Resuscitation (CPR) Procedure Note  Directed/Performed by: Carlisle Beers I personally directed ancillary staff and/or performed CPR in an effort to regain return of spontaneous circulation and to maintain cardiac, neuro and systemic perfusion.    Medical Decision making  Patient in PEA arrest, likely secondary to hypoxia and acidosis.    3 epis given and continuous CPR and 2 bicarbs.  I suspect the combination of Bicarb and intubation was what restored circulation  BICARB drip ordered.  GI tract decompressed with OG tube.    Assessment and Plan  Transfer to ICU on bicarb drip, full blood cultures, repeat labs and lactate and IV antibiotics give pink purulent secretions on ETT.  Dr. Hal Hope is  present and assuming care of the patient.      Crystalmarie Yasin, MD 2017/12/15 [phone]

## 2017-12-21 NOTE — Progress Notes (Signed)
[facility] Brief Note  Events of past 24 hours noted Pt had cholangiogram/biliary drain placed yesterday Early AM experienced PEA arrest, coded, transferred to ICU Hb drop to 5.7, shock from ABLA, septic, hypovlemic shock  Now intubated, maxed out on pressors, with plan to withdraw support shortly Renal of no benefit at this time  Jamal Maes, MD [place] Pager December 07, 2017, 2:30 PM

## 2017-12-21 NOTE — Progress Notes (Signed)
Patient with no spont resp, no pulse and no BP at 16:44.  Patient pronounced per two RN's, Muhammadali Ries Cheryln Manly, and  Austin Miles RN. Family at bedside and they are aware of patient has passed away. Dr. Melvyn Novas notified of patient passing.  ETT removed per patient's family request.  Patient is not a autopsy.  [facility] called, and patient is not a candidate for organ donation.  Makana Rostad Roselie Awkward RN

## 2017-12-21 NOTE — Death Summary Note (Signed)
[name] was a 83 y.o. male admitted on 11/16/2017 from [facility] with abdominal pain, and loss of appetite x 2 days.  He was noted to have orthostatic hypotension.  He was found to have acute renal failure and elevated liver enzymes.  He was admitted with concern for gallstone pancreatitis.  GI and renal were consulted.  He was started on bicarbonate infusion.  He had ERCP on 11/23/17, but this had to be aborted due to hemodynamic compromise.  IR was consulted to assess for biliary drain placement which was done on 11/23/17.  On 12/23/2017 he was noted to be unresponsive with PEA cardiac arrest.  He was intubated and started on pressors.  He developed an acute upper GI bleed. Family contacted and decision made for DNR status.  His clinical status got worse and he subsequently expired on 12/23/17 at 16:44.  Cause of death: Acute upper GI bleeding with hemorrhagic shock leading to PEA cardiac arrest with multiorgan failure in setting of gallstone pancreatitis  Additional final diagnoses: Acute hypoxic respiratory failure Acute blood loss anemia Acute kidney injury from ATN Metabolic acidosis Lactic acidosis CKD III Elevated liver enzymes Elevated troponin from demand ischemia  History of hypertension History of diabetes mellitus type II with peripheral neuropathy History of prostate cancer History of colorectal cancer History of seizures History of CVA History of CAD History of dementia Hypocalcemia Hyperphosphatemia  Chesley Mires, MD [facility] Pulmonary/Critical Care 12/20/2017, 4:42 PM

## 2017-12-21 NOTE — Consult Note (Deleted)
PULMONARY / CRITICAL CARE MEDICINE   Name: [name] MRN: [id number] DOB: 06/02/1931    ADMISSION DATE:  11/18/2017 CONSULTATION DATE:  12-24-17  REFERRING MD:  Dr. Wyline Copas  CHIEF COMPLAINT:  Cardiac Arrest   HISTORY OF PRESENT ILLNESS:   84 year old male with PMH of CKD stage 3, Colon CA s/p hemicolectomy, prostate CA, Dementia, HLD, HTN, CVA (2018), Seizures, Type 2 DM  P[facility] Hospital [facility] 1/[facility] with right upper quadrant abd pain and decreased appetite for the last 2 says with noted orthostatic hypotension. Upon arrival Crt 10.75, BUN 90, Lipase 175, AST 216, ALT 164, alk phos 1158, total bilirubin 8.4, CT A/P negative. ABD U/S with intrahepatic and CBD dilation. Taken for ERCP on 2/1,however, aborted due to hemodynamic instability. Taken to IR for biliary drain placement done pm 12/13/2017    On 2/2 patient was found unresponsive and in PEA. Given 3 EPI and 2 Bicarb. Intubated. Transferred to ICU. PCCM asked to consult       SUBJECTIVE:  No response to verbal/ breathing some above vent   VITAL SIGNS: BP 112/80   Pulse 79   Temp (!) 94.8 F (34.9 C) (Rectal)   Resp 13   Ht '5\' 9"'$  (1.753 m)   Wt 141 lb 8.6 oz (64.2 kg)   SpO2 100%   BMI 20.90 kg/m   HEMODYNAMICS:    VENTILATOR SETTINGS: Vent Mode: PRVC FiO2 (%):  [40 %-100 %] 40 % Set Rate:  [14 bmp] 14 bmp Vt Set:  [570 mL] 570 mL PEEP:  [5 cmH20] 5 cmH20 Plateau Pressure:  [12 cmH20] 12 cmH20  INTAKE / OUTPUT: I/O last 3 completed shifts: In: 1237.1 [P.O.:50; I.V.:1157.1; Blood:30] Out: 275 [Urine:25; Drains:250]  PHYSICAL EXAMINATION: General:  Thin and cachectic/ et in place  No jvd Oropharynx clear Neck supple Lungs with a few scattered exp > insp rhonchi bilaterally RRR no s3 or or sign murmur Abd soft Extr wam with no edema or clubbing noted Neuro  No response to verbal   LABS:  BMET Recent Labs  Lab 11/22/17 0514 12/13/2017 0435 12-24-17 0329  NA 140 140 150*  K 4.1 3.7 4.2   CL 106 98* 104  CO2 16* 25 22  BUN 96* 100* 95*  CREATININE 11.06* 11.29* 11.09*  GLUCOSE 267* 363* 206*    Electrolytes Recent Labs  Lab 11/22/17 0514 12/19/2017 0435 12/24/2017 0329  CALCIUM 6.2* 6.8* 6.4*  PHOS 9.1* 8.6* 10.6*    CBC Recent Labs  Lab 11/21/17 0109 11/22/17 0514 11/30/2017 0435 12/24/17 0323  WBC 4.3 3.8* 4.0  --   HGB 9.7* 9.2* 9.7* 5.7*  HCT 27.4* 25.7* 26.6* 15.8*  PLT 197 164 159  --     Coag's Recent Labs  Lab 10/24/2017 1932  INR 1.28    Sepsis Markers Recent Labs  Lab 11/12/2017 1424 December 24, 2017 0353  LATICACIDVEN 1.7 10.6*  PROCALCITON  --  1.95    ABG Recent Labs  Lab 2017-12-24 0413  PHART 7.302*  PCO2ART 36.2  PO2ART 374*    Liver Enzymes Recent Labs  Lab 11/22/17 0514 12/18/2017 0435 12-24-2017 0329  AST 240* 340* 582*  ALT 148* 175* 366*  ALKPHOS 1,064* 1,304* 897*  BILITOT 7.6* 8.5* 4.9*  ALBUMIN 2.5* 2.4* 1.5*    Cardiac Enzymes Recent Labs  Lab 11/21/17 0109 11/21/17 0720 2017-12-24 0322  TROPONINI 0.25* 0.25* 0.43*    Glucose Recent Labs  Lab 12/02/2017 2007 12/24/2017 0107 December 24, 2017 0306 12/24/2017 0519 12-24-2017  0655 12-12-2017 0807  GLUCAP 190* 182* 168* 165* 220* 229*    Imaging Dg Chest Port 1 View  Result Date: 12-12-17 CLINICAL DATA:  Post code. EXAM: PORTABLE CHEST 1 VIEW COMPARISON:  11/16/2017 FINDINGS: An endotracheal tube has been placed with tip measuring 5.6 cm above the carina. An enteric tube was placed with tip off the field of view but below the left hemidiaphragm. Heart size and pulmonary vascularity are normal. Small left pleural effusion, new since previous study. No airspace disease or consolidation. No pneumothorax. Calcification of the aorta. IMPRESSION: Appliances appear in satisfactory position.  Aortic atherosclerosis. Electronically Signed   By: Lucienne Capers M.D.   On: 12-12-2017 04:37   Dg Ercp Biliary & Pancreatic Ducts  Result Date: 12/15/2017 CLINICAL DATA:  ERCP EXAM: ERCP  TECHNIQUE: Multiple spot images obtained with the fluoroscopic device and submitted for interpretation post-procedure. FLUOROSCOPY TIME:  Fluoroscopy Time:  1 minute and 4 seconds Radiation Exposure Index (if provided by the fluoroscopic device): Number of Acquired Spot Images: 0 COMPARISON:  None. FINDINGS: The endoscope projects over the duodenum. A wire projects over the common bile duct. IMPRESSION: See above. These images were submitted for radiologic interpretation only. Please see the procedural report for the amount of contrast and the fluoroscopy time utilized. Electronically Signed   By: Marybelle Killings M.D.   On: 12/17/2017 13:16   Ir Biliary Drain Placement With Cholangiogram  Result Date: 12/12/2017 INDICATION: History of gallstone pancreatitis with elevated LFTs, post failed attempted ERCP. Request made for placement of a internal-external biliary drainage catheter. EXAM: ULTRASOUND AND FLUOROSCOPIC GUIDED PERCUTANEOUS TRANSHEPATIC CHOLANGIOGRAM AND BILIARY TUBE PLACEMENT COMPARISON:  CT abdomen and pelvis - 10/25/2017; abdominal ultrasound - 10/28/2017; ERCP - earlier same day MEDICATIONS: Ciprofloxacin 400 mg IV; the antibiotic was administered with an appropriate time frame prior to the initiation of the procedure. CONTRAST:  50 cc Isovue 300, administered into the biliary tree. ANESTHESIA/SEDATION: Moderate (conscious) sedation was employed during this procedure. A total of Versed 1.5 mg and Fentanyl 150 mcg was administered intravenously. Moderate Sedation Time: 60 minutes. The patient's level of consciousness and vital signs were monitored continuously by radiology nursing throughout the procedure under my direct supervision. FLUOROSCOPY TIME:  21 minutes 48 seconds. COMPLICATIONS: None immediate. TECHNIQUE: Informed written consent was obtained from the patient's family after a discussion of the risks, benefits and alternatives to treatment. Questions regarding the procedure were encouraged and  answered. A timeout was performed prior to the initiation of the procedure. The right upper abdominal quadrant was prepped and draped in the usual sterile fashion, and a sterile drape was applied covering the operative field. Maximum barrier sterile technique with sterile gowns and gloves were used for the procedure. A timeout was performed prior to the initiation of the procedure. Ultrasound scanning of the right upper abdominal quadrant was performed to delineate the anatomy and avoid transgression of the gallbladder or the pleural. A spot along the right mid axillary line was marked fluoroscopically inferior to the right costophrenic angle. Ultrasound scanning failed to delineate significant peripheral intrahepatic biliary duct dilatation. Several attempts were made to cannulate a peripheral bile duct however ultimately this proved unsuccessful As such, under direct ultrasound guidance, a more central biliary duct was successfully cannulated with contrast injection confirming opacification of the biliary tree. Next, a 30 gauge Chiba needle was utilized to cannulate a now opacified mildly dilated peripheral duct within the right lobe of the liver. Appropriate puncture was confirmed with cannulation of the duct  with a Nitrex wire to the central aspect of the biliary tree. Several spot radiographic fluoroscopic images were obtained in various obliquities confirming appropriate access. Next, the track was dilated an Accustick set. Through the outer catheter of the Accustick set, both irregular and ultimately a stiff glidewire was utilized to advance a 4 French angled glide catheter through the distal aspect of the CBD and into the duodenum. Contrast injection confirmed appropriate positioning. Over an Amplatz stiff wire, the tract was dilated and a 10.2 [place] biliary drainage catheter was advanced with coil ultimately locked within the duodenum. Contrast was injected and a completion radiograph was obtained. The  catheter was connected to a drainage bag which yielded the brisk return of dark black bile. The catheter was secured to the skin with an interrupted suture. The patient tolerated the procedure well without immediate postprocedural complication. FINDINGS: Ultrasound scanning failed to delineate significant dilatation the peripheral aspect of the biliary tree. Note is made of a small amount of perihepatic ascites. As such, utilizing a 2 stick technique as detailed above, a mildly dilated peripheral duct within the right lobe of the liver was cannulated allowing advancement of a 10 French percutaneous biliary drainage catheter with end ultimately coiled and locked within the duodenum. Note is made a small to moderate length narrowing of the distal aspect of the CBD, incompletely evaluated given limited cholangiographic images obtained at the time of initial internal/external biliary drainage catheter placement. IMPRESSION: 1. Successful placement of a 10.2 French percutaneous biliary drainage catheter with end coiled and locked within the duodenum. 2. Note is made a small to moderate length narrowing of the distal aspect of the CBD, incompletely evaluated given limited cholangiographic images obtained at the time of initial internal/external biliary drainage catheter placement. 3. Note made of a small amount of perihepatic ascites. Electronically Signed   By: Sandi Mariscal M.D.   On: 12/09/2017 18:49     CULTURES: MRSA PCR 1/30 > Negative   ANTIBIOTICS: None.   SIGNIFICANT EVENTS: 1/29 > Presents to ED  2/2 > Cardiac Arrest   LINES/TUBES: ETT 2/2 >>  DISCUSSION: 84 year old male presented to ED on 1/29 with Choledocholithiasis. Had biliary drain placed 2/1 by IR. 2/2 found unresponsive. PEA arrest. Given 3 EPI and 2 Bicarb. Intubated and Transferred to ICU.   ASSESSMENT / PLAN:  PULMONARY A: Acute Hypoxic Respiratory Distress  P:  No change vent for now other than titrate fio2 for sats >  95%    CARDIOVASCULAR A:  PEA Arrest  H/O HTN, HLD  Combination of hypovolemic septic and possibly hemorrhagic shock P:  Maxed out on levophed/ epi with transfusion in progress but may need to limit this based on futility if don't see response given his mulitple co-morbids and DNR status/ clearly not op candidate     RENAL A:   Acute on Chronic Kidney Disease (Baseline Crt 1.0), suspected secondary to severe volume depletion and hypotension on ACEi pre-admit   P:   Pre renal/ not much to offer here   GASTROINTESTINAL A:   Choledocholithiasis s/p Biliary Drain placement  ABD U/S with intrahepatic and CBD dilation    ERCP 2/1 aborted due to instability > IR performed biliary stent  Transaminitis  P:   GI Following   rx PPI/ blood products for now  HEMATOLOGIC A:   Anemia  H/O Prostate and Colon CA  P:   acute ugi bleed/ rx ppi / prbc's and d/c heparin   INFECTIOUS A:  Choledocholithiasis  with transaminitis P:   Trend WBC and Fever Curve Trend PCT and LA  PAN Culture  Follow Culture Data  Not currently on antibiotics Observe PCT trend to determine if they should be started  ENDOCRINE A:   DM    P:   Insulin drip      NEUROLOGIC A:   H/O Dementia (Unsure severity), Seizure Disorder   P:   Follow serial exam   FAMILY   - not at bedside, discussed per CCM coverage already this am    Really nothing else to offer at this point unless GI/ renal feel otherwise    Christinia Gully, MD [facility] [place] [phone] After 5:30 PM or weekends, use [facility] [phone]

## 2017-12-21 NOTE — Progress Notes (Signed)
Referring Physician(s): Dr Theora Gianotti  Supervising Physician: Sandi Mariscal  Patient Status:  [facility] - In-pt  Chief Complaint:  PTC/drain placed 12/13/2017  Subjective:  Pancreatitis- failed ERCP Biliary drain placed in IR Draining well Pt not well; no response  Allergies: Penicillins  Medications: Prior to Admission medications   Medication Sig Start Date End Date Taking? Authorizing Provider  atorvastatin (LIPITOR) 40 MG tablet As directed 08/29/15  Yes [provider]  ferrous sulfate 325 (65 FE) MG tablet Take 325 mg by mouth 2 (two) times daily.   Yes [provider]  insulin lispro (HUMALOG KWIKPEN) 100 UNIT/ML KiwkPen Inject into the skin. 10 units before meals if CBG greater than 350; GIVE 10 UNITS IF BS  GREATER THAN 350. Give 3 units with lunch, if CBG greater than 350 give additional 7 units. Give 5 units with dinner, if CBG is great than 350 give 5 additional units to equal 10 units.   Yes [provider]  lisinopril (PRINIVIL,ZESTRIL) 20 MG tablet Take 1 tablet (20 mg total) by mouth daily. 06/15/17  Yes [name], MD  acetaminophen (TYLENOL) 325 MG tablet Take 2 tablets (650 mg total) by mouth every 6 (six) hours as needed for mild pain or fever. 06/18/17   [name], MD  insulin glargine (LANTUS) 100 UNIT/ML injection Inject 0.1 mLs (10 Units total) into the skin daily. Patient not taking: Reported on 10/24/2017 06/18/17   [name], MD  metFORMIN (GLUCOPHAGE) 500 MG tablet Take 500 mg by mouth daily. 11/12/17   [provider]     Vital Signs: BP 106/67   Pulse (!) 104   Temp (!) 94.6 F (34.8 C) (Rectal)   Resp 14   Ht '5\' 9"'$  (1.753 m)   Wt 141 lb 8.6 oz (64.2 kg)   SpO2 100%   BMI 20.90 kg/m   Physical Exam  Skin: Skin is warm and dry.  Site is minimally bloody No sign of infection No hematoma OP good- bilious 250 cc yesterday 50 cc today   Nursing note and vitals reviewed.   Imaging: Ct Abdomen  Pelvis Wo Contrast  Result Date: 11/08/2017 CLINICAL DATA:  84 year old male with history of abdominal pain and poor PO intake over the past 2 days. EXAM: CT ABDOMEN AND PELVIS WITHOUT CONTRAST TECHNIQUE: Multidetector CT imaging of the abdomen and pelvis was performed following the standard protocol without IV contrast. COMPARISON:  CT of the chest, abdomen and pelvis 08/31/2015. FINDINGS: Lower chest: Atherosclerotic calcifications in the left anterior descending, left circumflex and right coronary arteries. Small calcified pleural plaques in the base of the right hemithorax. No definite calcified pleural plaques noted in the visualized left hemithorax. Hepatobiliary: No definite cystic or solid hepatic lesions are noted on today's noncontrast CT examination. Amorphous intermediate attenuation lying dependently in the gallbladder, presumably biliary sludge. No surrounding inflammatory changes are noted to suggest an acute cholecystitis at this time. Pancreas: No definite pancreatic mass or peripancreatic inflammatory changes on today's noncontrast CT examination. Spleen: Unremarkable. Adrenals/Urinary Tract: Low-attenuation lesions in both kidneys, incompletely characterized on today's noncontrast CT examination, but previously characterized as cysts. The largest of these is exophytic in the upper pole the left kidney measuring 7.5 x 8.6 cm, and demonstrates some peripheral calcifications in the wall, similar to the prior study. Bilateral adrenal glands are normal in appearance. No hydroureteronephrosis. Urinary bladder is normal in appearance. Stomach/Bowel: Unenhanced appearance of the stomach is normal. No pathologic dilatation of small bowel or colon.  Status post right hemicolectomy. Vascular/Lymphatic: Aortic atherosclerosis, without evidence of aneurysm in the abdominal or pelvic vasculature. No lymphadenopathy noted in the abdomen or pelvis on today's noncontrast CT examination. Reproductive: Fiducial  markers adjacent to the prostate gland. Prostate gland and seminal vesicles are otherwise unremarkable in appearance. Other: No significant volume of ascites.  No pneumoperitoneum. Musculoskeletal: There are no aggressive appearing lytic or blastic lesions noted in the visualized portions of the skeleton. IMPRESSION: 1. No definite acute findings are noted in the abdomen or pelvis to account for the patient's symptoms. 2. Aortic atherosclerosis, in addition to least 3 vessel coronary artery disease. Please note that although the presence of coronary artery calcium documents the presence of coronary artery disease, the severity of this disease and any potential stenosis cannot be assessed on this non-gated CT examination. Assessment for potential risk factor modification, dietary therapy or pharmacologic therapy may be warranted, if clinically indicated. 3. Calcified pleural plaques in the base of the right hemithorax. Left-sided calcified pleural plaques were previously noted on chest CT 08/31/2015. 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 4. Additional incidental findings, as above. Aortic Atherosclerosis (ICD10-I70.0). Electronically Signed   By: Vinnie Langton M.D.   On: 11/19/2017 16:26   US Abdomen Complete  Result Date: 11/18/2017 CLINICAL DATA:  Acute kidney injury, transaminitis, hyperbilirubinemia, jaundice, abdominal pain. EXAM: ABDOMEN ULTRASOUND COMPLETE COMPARISON:  CT 11/21/2017 FINDINGS: Gallbladder: Gallbladder is filled with sludge. No wall thickening. No visible stones. Negative sonographic Murphy's. Common bile duct: Diameter: Common bile duct is dilated to 11 mm. Sludge seen within the common bile duct. No visible ductal stones. Liver: Intrahepatic biliary ductal dilatation. No visible focal hepatic lesion. Normal echotexture. Portal vein is patent on color Doppler imaging with normal direction of blood flow towards the liver. IVC: No abnormality  visualized. Pancreas: Dilated pancreatic duct measuring up to 7 mm. No visible focal pancreatic mass lesion. Spleen: Size and appearance within normal limits. Right Kidney: Length: 11.5 cm. No hydronephrosis. Cysts within the right kidney, measuring up to 4.2 cm. Normal echotexture. Left Kidney: Length: 12.1 cm. Cysts within the left kidney, measuring up to 8.2 cm. No hydronephrosis. Normal echotexture. Abdominal aorta: No aneurysm visualized. Other findings: None. IMPRESSION: Intrahepatic and extrahepatic biliary ductal dilatation. Pancreatic ductal dilatation. Sludge noted within the common bile duct. No visible obstructing stones or visible pancreatic head mass, but the constellation of findings is concerning for ductal occlusion in the region of the pancreatic head. Recommend further evaluation with MRCP or ERCP filled gallbladder. No visible stones. Bilateral renal cysts. Electronically Signed   By: Rolm Baptise M.D.   On: 11/02/2017 20:03   Dg Chest Port 1 View  Result Date: Dec 18, 2017 CLINICAL DATA:  Post code. EXAM: PORTABLE CHEST 1 VIEW COMPARISON:  10/27/2017 FINDINGS: An endotracheal tube has been placed with tip measuring 5.6 cm above the carina. An enteric tube was placed with tip off the field of view but below the left hemidiaphragm. Heart size and pulmonary vascularity are normal. Small left pleural effusion, new since previous study. No airspace disease or consolidation. No pneumothorax. Calcification of the aorta. IMPRESSION: Appliances appear in satisfactory position.  Aortic atherosclerosis. Electronically Signed   By: Lucienne Capers M.D.   On: 2017-12-18 04:37   Dg Ercp Biliary & Pancreatic Ducts  Result Date: 12/01/2017 CLINICAL DATA:  ERCP EXAM: ERCP TECHNIQUE: Multiple spot images obtained with the fluoroscopic device and submitted for interpretation post-procedure. FLUOROSCOPY TIME:  Fluoroscopy Time:  1 minute and  4 seconds Radiation Exposure Index (if provided by the fluoroscopic  device): Number of Acquired Spot Images: 0 COMPARISON:  None. FINDINGS: The endoscope projects over the duodenum. A wire projects over the common bile duct. IMPRESSION: See above. These images were submitted for radiologic interpretation only. Please see the procedural report for the amount of contrast and the fluoroscopy time utilized. Electronically Signed   By: Marybelle Killings M.D.   On: 12/12/2017 13:16   Ir Biliary Drain Placement With Cholangiogram  Result Date: 12/14/2017 INDICATION: History of gallstone pancreatitis with elevated LFTs, post failed attempted ERCP. Request made for placement of a internal-external biliary drainage catheter. EXAM: ULTRASOUND AND FLUOROSCOPIC GUIDED PERCUTANEOUS TRANSHEPATIC CHOLANGIOGRAM AND BILIARY TUBE PLACEMENT COMPARISON:  CT abdomen and pelvis - 11/12/2017; abdominal ultrasound - 11/22/2017; ERCP - earlier same day MEDICATIONS: Ciprofloxacin 400 mg IV; the antibiotic was administered with an appropriate time frame prior to the initiation of the procedure. CONTRAST:  50 cc Isovue 300, administered into the biliary tree. ANESTHESIA/SEDATION: Moderate (conscious) sedation was employed during this procedure. A total of Versed 1.5 mg and Fentanyl 150 mcg was administered intravenously. Moderate Sedation Time: 60 minutes. The patient's level of consciousness and vital signs were monitored continuously by radiology nursing throughout the procedure under my direct supervision. FLUOROSCOPY TIME:  21 minutes 48 seconds. COMPLICATIONS: None immediate. TECHNIQUE: Informed written consent was obtained from the patient's family after a discussion of the risks, benefits and alternatives to treatment. Questions regarding the procedure were encouraged and answered. A timeout was performed prior to the initiation of the procedure. The right upper abdominal quadrant was prepped and draped in the usual sterile fashion, and a sterile drape was applied covering the operative field. Maximum  barrier sterile technique with sterile gowns and gloves were used for the procedure. A timeout was performed prior to the initiation of the procedure. Ultrasound scanning of the right upper abdominal quadrant was performed to delineate the anatomy and avoid transgression of the gallbladder or the pleural. A spot along the right mid axillary line was marked fluoroscopically inferior to the right costophrenic angle. Ultrasound scanning failed to delineate significant peripheral intrahepatic biliary duct dilatation. Several attempts were made to cannulate a peripheral bile duct however ultimately this proved unsuccessful As such, under direct ultrasound guidance, a more central biliary duct was successfully cannulated with contrast injection confirming opacification of the biliary tree. Next, a 16 gauge Chiba needle was utilized to cannulate a now opacified mildly dilated peripheral duct within the right lobe of the liver. Appropriate puncture was confirmed with cannulation of the duct with a Nitrex wire to the central aspect of the biliary tree. Several spot radiographic fluoroscopic images were obtained in various obliquities confirming appropriate access. Next, the track was dilated an Accustick set. Through the outer catheter of the Accustick set, both irregular and ultimately a stiff glidewire was utilized to advance a 4 French angled glide catheter through the distal aspect of the CBD and into the duodenum. Contrast injection confirmed appropriate positioning. Over an Amplatz stiff wire, the tract was dilated and a 10.2 [place] biliary drainage catheter was advanced with coil ultimately locked within the duodenum. Contrast was injected and a completion radiograph was obtained. The catheter was connected to a drainage bag which yielded the brisk return of dark black bile. The catheter was secured to the skin with an interrupted suture. The patient tolerated the procedure well without immediate postprocedural  complication. FINDINGS: Ultrasound scanning failed to delineate significant dilatation the peripheral aspect of  the biliary tree. Note is made of a small amount of perihepatic ascites. As such, utilizing a 2 stick technique as detailed above, a mildly dilated peripheral duct within the right lobe of the liver was cannulated allowing advancement of a 10 French percutaneous biliary drainage catheter with end ultimately coiled and locked within the duodenum. Note is made a small to moderate length narrowing of the distal aspect of the CBD, incompletely evaluated given limited cholangiographic images obtained at the time of initial internal/external biliary drainage catheter placement. IMPRESSION: 1. Successful placement of a 10.2 French percutaneous biliary drainage catheter with end coiled and locked within the duodenum. 2. Note is made a small to moderate length narrowing of the distal aspect of the CBD, incompletely evaluated given limited cholangiographic images obtained at the time of initial internal/external biliary drainage catheter placement. 3. Note made of a small amount of perihepatic ascites. Electronically Signed   By: Sandi Mariscal M.D.   On: 12/15/2017 18:49    Labs:  CBC: Recent Labs    11/13/2017 1424 11/21/17 0109 11/22/17 0514 12/02/2017 0435 11/25/2017 0323  WBC 4.4 4.3 3.8* 4.0  --   HGB 10.2* 9.7* 9.2* 9.7* 5.7*  HCT 29.8* 27.4* 25.7* 26.6* 15.8*  PLT 213 197 164 159  --     COAGS: Recent Labs    11/05/2017 1932  INR 1.28    BMP: Recent Labs    11/21/17 0109 11/22/17 0514 12/15/2017 0435 Nov 25, 2017 0329  NA 139 140 140 150*  K 4.4 4.1 3.7 4.2  CL 104 106 98* 104  CO2 15* 16* 25 22  GLUCOSE 192* 267* 363* 206*  BUN 90* 96* 100* 95*  CALCIUM 6.8* 6.2* 6.8* 6.4*  CREATININE 10.74* 11.06* 11.29* 11.09*  GFRNONAA 4* 4* 4* 4*  GFRAA 4* 4* 4* 4*    LIVER FUNCTION TESTS: Recent Labs    11/21/17 0109 11/22/17 0514 12/07/2017 0435 11-25-2017 0329  BILITOT 8.2* 7.6* 8.5*  4.9*  AST 203* 240* 340* 582*  ALT 152* 148* 175* 366*  ALKPHOS 1,089* 1,064* 1,304* 897*  PROT 6.9 5.8* 6.1* 4.2*  ALBUMIN 2.9* 2.5* 2.4* 1.5*    Assessment and Plan:  Biliary obstruction Biliary drain placed 2/1 OP good Will follow  Electronically Signed: Violett Hobbs A, PA-C November 25, 2017, 2:20 PM   I spent a total of 15 Minutes at the the patient's bedside AND on the patient's hospital floor or unit, greater than 50% of which was counseling/coordinating care for Biliary drain

## 2017-12-21 NOTE — Progress Notes (Signed)
Nurse noticed that patients HR on telemetry monitor had dropped to 37. The nurse ran into the room to find the patient unresponsive without a pulse. Chest compressions were started and a code blue was initiated. See code report sheet for more information.

## 2017-12-21 NOTE — Progress Notes (Signed)
Paged Dr. Melvyn Novas to speak with the patient's two sons.  Dr. Melvyn Novas spoke with one son and then all the drips were to be turned off.  Family not quite ready to turn off the Levophed and the Epi drip.  The insulin drip and NaHCO3 drip d/c'd per order.  The Levophed and EPi drip were d/c'd at 1545 per order.  Large amount of family at bedside.  Halena Mohar Roselie Awkward RN

## 2017-12-21 NOTE — Anesthesia Procedure Notes (Addendum)
Procedure Name: Intubation Date/Time: 12/23/17 3:15 AM Performed by: West Pugh, CRNA Pre-anesthesia Checklist: Patient identified, Emergency Drugs available, Suction available, Patient being monitored and Timeout performed Patient Re-evaluated:Patient Re-evaluated prior to induction Oxygen Delivery Method: Ambu bag Preoxygenation: Pre-oxygenation with 100% oxygen Induction Type: Cricoid Pressure applied Laryngoscope Size: Mac and 4 Grade View: Grade I Tube type: Subglottic suction tube Tube size: 7.5 mm Number of attempts: 1 Airway Equipment and Method: Stylet Placement Confirmation: ETT inserted through vocal cords under direct vision,  CO2 detector and breath sounds checked- equal and bilateral ( positive color change with CO2 detector) Secured at: 23 cm Tube secured with: tube holder. Dental Injury: Teeth and Oropharynx as per pre-operative assessment

## 2017-12-21 NOTE — Progress Notes (Signed)
Morgue care done and patient taken to morgue via bed.  Andree Heeg Roselie Awkward RN

## 2017-12-21 NOTE — Consult Note (Addendum)
PULMONARY / CRITICAL CARE MEDICINE   Name: [name] MRN: [id number] DOB: 01/14/1931    ADMISSION DATE:  11/11/2017 CONSULTATION DATE:  12/01/17  REFERRING MD:  Dr. Wyline Copas  CHIEF COMPLAINT:  Cardiac Arrest   HISTORY OF PRESENT ILLNESS:   84 year old male with PMH of CKD stage 3, Colon CA s/p hemicolectomy, prostate CA, Dementia, HLD, HTN, CVA (2018), Seizures, Type 2 DM  Presents to Hospital on 1/[facility] from [facility] with right upper quadrant abd pain and decreased appetite for the last 2 says with noted orthostatic hypotension. Upon arrival Crt 10.75, BUN 90, Lipase 175, AST 216, ALT 164, alk phos 1158, total bilirubin 8.4, CT A/P negative. ABD U/S with intrahepatic and CBD dilation. Taken for ERCP on 2/1,however, aborted due to hemodynamic instability. Taken to IR for biliary drain placement.   On 2/2 patient was found unresponsive and in PEA. Given 3 EPI and 2 Bicarb. Intubated. Transferred to ICU. PCCM asked to consult   Pt being wheeled down from [facility] [place] on initial evaluation CXR then performed  Attempted to call [facility] with no answer Then spoke to [employer] who is walking into the hospital There is an additional family member: Daughter  Karie Kirks hicks - [phone] I was able to have a conversation with both Jori Moll and [employer].  They have made a decision to make the patient DNR but to continue with medical interventions at this time.   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  PAST SURGICAL HISTORY: He  has a past surgical history that includes Prostate surgery; Colon surgery (06/2010); Port-a-cath removal (N/A, 10/19/2014); and IR BILIARY DRAIN PLACEMENT WITH CHOLANGIOGRAM (12/04/2017).  Allergies  Allergen Reactions  . Penicillins Swelling    Has patient had a PCN reaction causing immediate rash, facial/tongue/throat swelling, SOB or lightheadedness with hypotension: Unknown Has patient had a PCN reaction causing severe rash involving mucus membranes or skin necrosis: Unknown Has patient had a PCN reaction that required hospitalization: Unknown Has patient had a PCN reaction occurring within the last 10 years: Unknown If all of the above answers are "NO", then may proceed with Cephalosporin use.  Happened in childhood    Current Facility-Administered Medications on File Prior to Encounter  Medication  . sodium chloride 0.9 % injection 10 mL   Current Outpatient Medications on File Prior to Encounter  Medication Sig  . atorvastatin (LIPITOR) 40 MG tablet As directed  . ferrous sulfate 325 (65 FE) MG tablet Take 325 mg by mouth 2 (two) times daily.  . insulin lispro (HUMALOG KWIKPEN) 100 UNIT/ML KiwkPen Inject into the skin. 10 units before meals if CBG greater than 350; GIVE 10 UNITS IF BS  GREATER THAN 350. Give 3 units with lunch, if CBG greater than 350 give additional 7 units. Give 5 units with dinner, if CBG is great than 350 give 5 additional units to equal 10 units.  Marland Kitchen lisinopril (PRINIVIL,ZESTRIL) 20 MG tablet Take 1 tablet (20 mg total) by mouth daily.  Marland Kitchen acetaminophen (TYLENOL) 325 MG tablet Take 2 tablets (650  mg total) by mouth every 6 (six) hours as needed for mild pain or fever.  . insulin glargine (LANTUS) 100 UNIT/ML injection Inject 0.1 mLs (10 Units total) into the skin daily. (Patient not taking: Reported on 11/01/2017)  . metFORMIN (GLUCOPHAGE) 500  MG tablet Take 500 mg by mouth daily.    FAMILY HISTORY:  His indicated that his mother is deceased. He indicated that his father is deceased. He indicated that his sister is alive. He indicated that his brother is alive. He indicated that both of his sons are alive.   SOCIAL HISTORY: He  reports that he has quit smoking. His smoking use included cigarettes. He has a 0.75 pack-year smoking history. he has never used smokeless tobacco. He reports that he does not drink alcohol or use drugs.  REVIEW OF SYSTEMS:   Unable to review as patient is intubated   SUBJECTIVE:   VITAL SIGNS: BP 98/62 (BP Location: Right Arm) Comment: after 1 L bolus  Pulse 79   Temp 97.6 F (36.4 C) (Oral)   Resp 16   Ht '5\' 9"'$  (1.753 m)   Wt 58.1 kg (128 lb)   SpO2 95%   BMI 18.90 kg/m   HEMODYNAMICS:    VENTILATOR SETTINGS:    INTAKE / OUTPUT: I/O last 3 completed shifts: In: 1350.8 [P.O.:290; I.V.:1060.8] Out: 115 [Urine:115]  PHYSICAL EXAMINATION: General: Thin cachectic male with temporal wasting intubated and sedated Neuro: Initially no gag no pupillary response 30 minutes post code patient has sluggish pupils bilaterally equal and positive gag moving his mouth around the ET tube, following commands, not withdrawing from pain HEENT: ET tube in position, normocephalic atraumatic, scleral icterus,  Cardiovascular: S1-S2 appreciated heart rate 102'bpms continues on vasopressors Lungs: Coarse breath sounds bilaterally Abdomen: Soft scaphoid abdomen positive biliary drain in the right upper quadrant with clean dressing Musculoskeletal: Cachectic male with temporal wasting Skin: Grossly intact   LABS:  BMET Recent Labs  Lab 11/21/17 0109 11/22/17 0514 11/26/2017 0435  NA 139 140 140  K 4.4 4.1 3.7  CL 104 106 98*  CO2 15* 16* 25  BUN 90* 96* 100*  CREATININE 10.74* 11.06* 11.29*  GLUCOSE 192* 267* 363*    Electrolytes Recent Labs  Lab 11/21/17 0109 11/22/17 0514 12/15/2017 0435   CALCIUM 6.8* 6.2* 6.8*  PHOS  --  9.1* 8.6*    CBC Recent Labs  Lab 11/21/17 0109 11/22/17 0514 12/13/2017 0435  WBC 4.3 3.8* 4.0  HGB 9.7* 9.2* 9.7*  HCT 27.4* 25.7* 26.6*  PLT 197 164 159    Coag's Recent Labs  Lab 11/16/2017 1932  INR 1.28    Sepsis Markers Recent Labs  Lab 11/09/2017 1424  LATICACIDVEN 1.7    ABG No results for input(s): PHART, PCO2ART, PO2ART in the last 168 hours.  Liver Enzymes Recent Labs  Lab 11/21/17 0109 11/22/17 0514 12/08/2017 0435  AST 203* 240* 340*  ALT 152* 148* 175*  ALKPHOS 1,089* 1,064* 1,304*  BILITOT 8.2* 7.6* 8.5*  ALBUMIN 2.9* 2.5* 2.4*    Cardiac Enzymes Recent Labs  Lab 11/11/2017 1932 11/21/17 0109 11/21/17 0720  TROPONINI 0.26* 0.25* 0.25*    Glucose Recent Labs  Lab 12/15/2017 1154 12/19/2017 2007 December 09, 2017 0107 December 09, 2017 0306  GLUCAP 276* 190* 182* 168*    Imaging Dg Ercp Biliary & Pancreatic Ducts  Result Date: 11/29/2017 CLINICAL DATA:  ERCP EXAM: ERCP TECHNIQUE: Multiple spot images obtained with the fluoroscopic device and submitted for interpretation post-procedure. FLUOROSCOPY TIME:  Fluoroscopy Time:  1 minute and 4 seconds Radiation Exposure Index (if provided by the fluoroscopic device): Number of Acquired Spot Images: 0 COMPARISON:  None. FINDINGS: The endoscope projects over the duodenum. A wire projects over the common bile duct. IMPRESSION: See above. These images were submitted for radiologic interpretation only. Please see the procedural report for the amount of contrast and the fluoroscopy time utilized. Electronically Signed   By: Marybelle Killings M.D.   On: 12/05/2017 13:16   Ir Biliary Drain Placement With Cholangiogram  Result Date: 11/27/2017 INDICATION: History of gallstone pancreatitis with elevated LFTs, post failed attempted ERCP. Request made for placement of a internal-external biliary drainage catheter. EXAM: ULTRASOUND AND FLUOROSCOPIC GUIDED PERCUTANEOUS TRANSHEPATIC CHOLANGIOGRAM AND  BILIARY TUBE PLACEMENT COMPARISON:  CT abdomen and pelvis - 10/26/2017; abdominal ultrasound - 11/21/2017; ERCP - earlier same day MEDICATIONS: Ciprofloxacin 400 mg IV; the antibiotic was administered with an appropriate time frame prior to the initiation of the procedure. CONTRAST:  50 cc Isovue 300, administered into the biliary tree. ANESTHESIA/SEDATION: Moderate (conscious) sedation was employed during this procedure. A total of Versed 1.5 mg and Fentanyl 150 mcg was administered intravenously. Moderate Sedation Time: 60 minutes. The patient's level of consciousness and vital signs were monitored continuously by radiology nursing throughout the procedure under my direct supervision. FLUOROSCOPY TIME:  21 minutes 48 seconds. COMPLICATIONS: None immediate. TECHNIQUE: Informed written consent was obtained from the patient's family after a discussion of the risks, benefits and alternatives to treatment. Questions regarding the procedure were encouraged and answered. A timeout was performed prior to the initiation of the procedure. The right upper abdominal quadrant was prepped and draped in the usual sterile fashion, and a sterile drape was applied covering the operative field. Maximum barrier sterile technique with sterile gowns and gloves were used for the procedure. A timeout was performed prior to the initiation of the procedure. Ultrasound scanning of the right upper abdominal quadrant was performed to delineate the anatomy and avoid transgression of the gallbladder or the pleural. A spot along the right mid axillary line was marked fluoroscopically inferior to the right costophrenic angle. Ultrasound scanning failed to delineate significant peripheral intrahepatic biliary duct dilatation. Several attempts were made to cannulate a peripheral bile duct however ultimately this proved unsuccessful As such, under direct ultrasound guidance, a more central biliary duct was successfully cannulated with contrast  injection confirming opacification of the biliary tree. Next, a 32 gauge Chiba needle was utilized to cannulate a now opacified mildly dilated peripheral duct within the right lobe of the liver. Appropriate puncture was confirmed with cannulation of the duct with a Nitrex wire to the central aspect of the biliary tree. Several spot radiographic fluoroscopic images were obtained in various obliquities confirming appropriate access. Next, the track was dilated an Accustick set. Through the outer catheter of the Accustick set, both irregular and ultimately a stiff glidewire was utilized to advance a 4 French angled glide catheter through the distal aspect of the CBD and into the duodenum. Contrast injection confirmed appropriate positioning. Over an Amplatz stiff wire, the tract was dilated and a 10.2 [place] biliary drainage catheter was advanced with coil ultimately locked within the duodenum. Contrast was injected and a completion radiograph was obtained. The catheter was connected to a drainage bag which yielded the brisk return of dark black bile. The catheter was secured to the skin with an interrupted suture. The patient tolerated the procedure well without immediate postprocedural complication. FINDINGS: Ultrasound scanning failed to delineate significant dilatation the peripheral  aspect of the biliary tree. Note is made of a small amount of perihepatic ascites. As such, utilizing a 2 stick technique as detailed above, a mildly dilated peripheral duct within the right lobe of the liver was cannulated allowing advancement of a 10 French percutaneous biliary drainage catheter with end ultimately coiled and locked within the duodenum. Note is made a small to moderate length narrowing of the distal aspect of the CBD, incompletely evaluated given limited cholangiographic images obtained at the time of initial internal/external biliary drainage catheter placement. IMPRESSION: 1. Successful placement of a 10.2 French  percutaneous biliary drainage catheter with end coiled and locked within the duodenum. 2. Note is made a small to moderate length narrowing of the distal aspect of the CBD, incompletely evaluated given limited cholangiographic images obtained at the time of initial internal/external biliary drainage catheter placement. 3. Note made of a small amount of perihepatic ascites. Electronically Signed   By: Sandi Mariscal M.D.   On: 12/15/2017 18:49     STUDIES:  CXR 1/29 > No acute  CT A/P 1/29 > 1. No definite acute findings are noted in the abdomen or pelvis to account for the patient's symptoms. 2. Aortic atherosclerosis, in addition to least 3 vessel coronary artery disease. Please note that although the presence of coronary artery calcium documents the presence of coronary artery disease, the severity of this disease and any potential stenosis cannot be assessed on this non-gated CT examination. Assessment for potential risk factor modification, dietary therapy or pharmacologic therapy may be warranted, if clinically indicated. 3. Calcified pleural plaques in the base of the right hemithorax. Left-sided calcified pleural plaques were previously noted on chest CT 08/31/2015. 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 [place] ABD 1/29 > Intrahepatic and extrahepatic biliary ductal dilatation. Pancreatic ductal dilatation. Sludge noted within the common bile duct. No visible obstructing stones or visible pancreatic head mass, but the constellation of findings is concerning for ductal occlusion in the region of the pancreatic head. Recommend further evaluation with MRCP or ERCP filled gallbladder. No visible stones  CULTURES: MRSA PCR 1/30 > Negative   ANTIBIOTICS: None.   SIGNIFICANT EVENTS: 1/29 > Presents to ED  2/2 > Cardiac Arrest   LINES/TUBES: ETT 2/2 >>  DISCUSSION: 84 year old male presents to ED on 1/29 with Choledocholithiasis. Had biliary drain  placed 2/1. 2/2 found unresponsive. PEA arrest. Given 3 EPI and 2 Bicarb. Intubated and Transferred to ICU.   ASSESSMENT / PLAN:  PULMONARY A: Acute Hypoxic Respiratory Distress  P:   Intubated on PRBC ABG post code pH 7.3 PO2's in the 300s Crease FiO2 to keep O2 sats greater than 92% Vent Support  VAP Bundle  Chest x-ray and ABG reviewed ETT in good position  CARDIOVASCULAR A:  PEA Arrest  H/O HTN, HLD  Combination of hypovolemic septic and possibly hemorrhagic shock P:  Cardiac Monitoring  Maintain MAP >65  Initially on Levophed and dopamine ggt Starting epinephrine ggt LA 10.6 Continue resuscitation with IV fluids Trend lactic We will transfuse PRBCs  RENAL A:   Acute on Chronic Kidney Disease (Baseline Crt 1.0), suspected secondary to severe volume depletion and hypotension  P:  Current GFR for Making around 20 cc of urine a day per nursing staff Indwelling Foley catheter in place I's and O's Avoid nephrotoxic agents Will try to keep map greater than 65 to improve perfusion to the kidney and see if there is a response Nephrology Following  Trend BMP  Replace electrolytes as indicated   GASTROINTESTINAL A:   Choledocholithiasis s/p Biliary Drain placement  ABD U/S with intrahepatic and CBD dilation    ERCP 2/1 aborted due to instability  Transaminitis  P:   GI Following  Trend LFT and Bilirubin  ?  Upper GI bleed Nursing reports OGT produced 600 cc of bloody fluid No history of melena Hgb currently 5.7  HEMATOLOGIC A:   Anemia  H/O Prostate and Colon CA  P:  Nursing staff 600 cc of bloody fluid from the OG tube Trend CBC  Hemoglobin 5.7 hematocrit 15.8 Type and screen We will transfuse 2 units PRBCs  INFECTIOUS A:   Choledocholithiasis  with transaminitis P:   Trend WBC and Fever Curve Trend PCT and LA  PAN Culture  Follow Culture Data  Not currently on antibiotics Observe PCT trend to determine if they should be  started  ENDOCRINE A:   DM    P:   Trend Glucose  Consistently greater than 180 Started on insulin drip   NEUROLOGIC A:   H/O Dementia (Unsure severity), Seizure Disorder   P:   Only not on any sedation Initially did not have neurological exam 30 minutes post code that it gag pupils reactive patient having some spontaneous movement but not withdrawing to pain  neurochecks Q4 RASS goal: 0/-1 Monitor  Fentanyl PRN   FAMILY  - Updates: I was able to have a conversation with both Jori Moll and [employer].  They have made a decision to make the patient DNR but to continue with medical interventions at this time.   - Inter-disciplinary family meet or Palliative Care meeting due by: today and completed    I, Dr Seward Carol have personally reviewed patient's available data, including medical history, events of note, physical examination and test results as part of my evaluation. I have discussed with NP and other care providers such as pharmacist, RN and Elink.    The patient is critically ill with multiple organ systems failure and requires high complexity decision making for assessment and support, frequent evaluation and titration of therapies, application of advanced monitoring technologies and extensive interpretation of multiple databases.   Critical Care Time devoted to patient care services described in this note is 65 Minutes. This time reflects time of care of this signee Dr Seward Carol. This critical care time does not reflect procedure time, or teaching time or supervisory time of NP but could involve care discussion time   CC time: 65 minutes CODE STATUS: DNR Family: at bedside son spoke to other son via phone and there is a daughter Disposition: ICU Prognosis: Guarded  Dr. Seward Carol Pulmonary Critical Care Medicine Locums [facility] Pager: [phone]  12-04-17, 3:32 AM

## 2017-12-21 DEATH — deceased
